# Patient Record
Sex: Female | Born: 1985 | Race: Black or African American | Hispanic: No | Marital: Single | Smoking: Never smoker
Health system: Southern US, Community
[De-identification: ages and names within clinical notes are randomized; demographics above are authoritative.]

## PROBLEM LIST (undated history)

## (undated) DIAGNOSIS — D500 Iron deficiency anemia secondary to blood loss (chronic): Secondary | ICD-10-CM

## (undated) DIAGNOSIS — I429 Cardiomyopathy, unspecified: Secondary | ICD-10-CM

## (undated) DIAGNOSIS — Z8614 Personal history of Methicillin resistant Staphylococcus aureus infection: Secondary | ICD-10-CM

## (undated) DIAGNOSIS — B009 Herpesviral infection, unspecified: Secondary | ICD-10-CM

## (undated) DIAGNOSIS — F419 Anxiety disorder, unspecified: Secondary | ICD-10-CM

## (undated) DIAGNOSIS — I517 Cardiomegaly: Secondary | ICD-10-CM

## (undated) DIAGNOSIS — F32A Depression, unspecified: Secondary | ICD-10-CM

## (undated) DIAGNOSIS — I10 Essential (primary) hypertension: Secondary | ICD-10-CM

## (undated) DIAGNOSIS — D259 Leiomyoma of uterus, unspecified: Secondary | ICD-10-CM

## (undated) DIAGNOSIS — Z9289 Personal history of other medical treatment: Secondary | ICD-10-CM

## (undated) DIAGNOSIS — R011 Cardiac murmur, unspecified: Secondary | ICD-10-CM

## (undated) DIAGNOSIS — R87629 Unspecified abnormal cytological findings in specimens from vagina: Secondary | ICD-10-CM

## (undated) DIAGNOSIS — N809 Endometriosis, unspecified: Secondary | ICD-10-CM

## (undated) DIAGNOSIS — N7093 Salpingitis and oophoritis, unspecified: Secondary | ICD-10-CM

## (undated) DIAGNOSIS — G4730 Sleep apnea, unspecified: Secondary | ICD-10-CM

## (undated) HISTORY — DX: Salpingitis and oophoritis, unspecified: N70.93

## (undated) HISTORY — DX: Endometriosis, unspecified: N80.9

## (undated) HISTORY — DX: Herpesviral infection, unspecified: B00.9

## (undated) HISTORY — PX: OTHER SURGICAL HISTORY: SHX169

## (undated) HISTORY — DX: Personal history of other medical treatment: Z92.89

## (undated) HISTORY — DX: Unspecified abnormal cytological findings in specimens from vagina: R87.629

## (undated) HISTORY — DX: Cardiomegaly: I51.7

## (undated) HISTORY — DX: Cardiac murmur, unspecified: R01.1

---

## 2004-04-07 ENCOUNTER — Emergency Department (HOSPITAL_COMMUNITY): Admission: EM | Admit: 2004-04-07 | Discharge: 2004-04-07 | Payer: Self-pay | Admitting: Emergency Medicine

## 2010-05-03 ENCOUNTER — Ambulatory Visit: Payer: BC Managed Care – PPO | Admitting: Internal Medicine

## 2010-05-03 ENCOUNTER — Encounter: Payer: Self-pay | Admitting: Internal Medicine

## 2010-05-03 DIAGNOSIS — M436 Torticollis: Secondary | ICD-10-CM | POA: Insufficient documentation

## 2010-05-04 DIAGNOSIS — D509 Iron deficiency anemia, unspecified: Secondary | ICD-10-CM | POA: Insufficient documentation

## 2010-05-04 DIAGNOSIS — I10 Essential (primary) hypertension: Secondary | ICD-10-CM | POA: Insufficient documentation

## 2010-05-08 HISTORY — PX: ROBOT ASSISTED MYOMECTOMY: SHX5142

## 2010-05-15 NOTE — Assessment & Plan Note (Signed)
Summary: neck pain   Vital Signs:  Patient Profile:   25 Years Old Female CC:      Pain in the neck and I think I have a sinus infection upon awakening yesterday Height:     62 inches Weight:      193 pounds O2 Sat:      100 % Temp:     97.3 degrees F Pulse rate:   81 / minute BP sitting:   147 / 99  (left arm)  Pt. in pain?   yes    Intensity:   5                   Current Allergies: ! * LATEXHistory of Present Illness Chief Complaint: Pain in the neck and I think I have a sinus infection upon awakening starting yesterday History of Present Illness: Patient with some sneezing and maybe post nasal drip but no nasal congestion, face pressure, f/c, sore throat and this has been going on for several weeks. Her real concern is right lateral neck pain upon awakening yesterday. No trauma, new activities and never had before. No arm symptoms .  REVIEW OF SYSTEMS Constitutional Symptoms      Denies fever, chills, night sweats, weight loss, weight gain, and fatigue.  Eyes       Denies change in vision, eye pain, eye discharge, glasses, contact lenses, and eye surgery. Ear/Nose/Throat/Mouth       Complains of ear pain and sinus problems.      Denies hearing loss/aids, change in hearing, ear discharge, dizziness, frequent runny nose, frequent nose bleeds, sore throat, hoarseness, and tooth pain or bleeding.      Comments: mild right. sneeze occas Respiratory       Denies dry cough, productive cough, wheezing, shortness of breath, asthma, bronchitis, and emphysema/COPD.  Cardiovascular       Denies murmurs, chest pain, and tires easily with exhertion.    Gastrointestinal       Denies stomach pain, nausea/vomiting, diarrhea, constipation, blood in bowel movements, and indigestion. Genitourniary       Denies painful urination, kidney stones, and loss of urinary control. Neurological       Denies paralysis, seizures, and fainting/blackouts. Musculoskeletal       Complains of muscle  pain, joint pain, and joint stiffness.      Denies decreased range of motion, redness, swelling, muscle weakness, and gout.      Comments: see hpi Skin       Denies bruising, unusual mles/lumps or sores, and hair/skin or nail changes.      Comments: eczema and acne Psych       Denies mood changes, temper/anger issues, anxiety/stress, speech problems, depression, and sleep problems.  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 Juline Patch MD   The patient and/or caregiver has been counseled thoroughly with regard to medications prescribed including dosage, schedule, interactions, rationale for use, and possible side effects and they verbalize understanding.  Diagnoses and expected course of recovery discussed and will return if not improved as expected or if the condition worsens. Patient and/or caregiver verbalized understanding.  Prescriptions: CYCLOBENZAPRINE HCL 10 MG TABS (CYCLOBENZAPRINE HCL) 1 by mouth three times a day as needed neck spasm  #20 x 0   Entered and Authorized by:   J. Juline Patch MD   Signed by:   Shela Commons. Juline Patch MD on 05/03/2010   Method used:   Electronically to        [facility]  [address] (retail)       [address], [address]       [place], [place]  [address]       Ph: [phone]       Fax: [phone]   RxID:   [id number]   Patient Instructions: 1)  aleve 1-2 two times a day with food. 2)  Take 650-1000mg  of Tylenol every 4-6 hours as needed for relief of pain or comfort of fever AVOID taking more than 4000mg   in a 24 hour period (can cause liver damage in higher doses). 3)  ice alternating with heat to neck. 4)  claritin-D two times a day for congestion. 5)  Please schedule a follow-up appointment as needed at emergency and primary MD. 6)  continue to monitor blood pressure and see MD if more than 135/85

## 2011-06-04 DIAGNOSIS — N803 Endometriosis of pelvic peritoneum: Secondary | ICD-10-CM | POA: Insufficient documentation

## 2011-07-10 DIAGNOSIS — IMO0001 Reserved for inherently not codable concepts without codable children: Secondary | ICD-10-CM | POA: Insufficient documentation

## 2014-03-09 DIAGNOSIS — Z9289 Personal history of other medical treatment: Secondary | ICD-10-CM

## 2014-03-09 HISTORY — PX: IR IMAGE GUIDED DRAINAGE BY PERCUTANEOUS CATHETER: IMG5465

## 2014-03-09 HISTORY — DX: Personal history of other medical treatment: Z92.89

## 2014-08-31 DIAGNOSIS — R011 Cardiac murmur, unspecified: Secondary | ICD-10-CM | POA: Insufficient documentation

## 2014-11-28 ENCOUNTER — Encounter: Payer: Self-pay | Admitting: Radiology

## 2014-11-28 ENCOUNTER — Emergency Department: Payer: 59

## 2014-11-28 ENCOUNTER — Inpatient Hospital Stay
Admission: EM | Admit: 2014-11-28 | Discharge: 2014-12-01 | DRG: 761 | Payer: 59 | Attending: Obstetrics and Gynecology | Admitting: Obstetrics and Gynecology

## 2014-11-28 DIAGNOSIS — R1900 Intra-abdominal and pelvic swelling, mass and lump, unspecified site: Secondary | ICD-10-CM | POA: Diagnosis present

## 2014-11-28 DIAGNOSIS — R531 Weakness: Secondary | ICD-10-CM | POA: Diagnosis present

## 2014-11-28 DIAGNOSIS — D259 Leiomyoma of uterus, unspecified: Principal | ICD-10-CM | POA: Diagnosis present

## 2014-11-28 DIAGNOSIS — K429 Umbilical hernia without obstruction or gangrene: Secondary | ICD-10-CM | POA: Diagnosis present

## 2014-11-28 DIAGNOSIS — N7093 Salpingitis and oophoritis, unspecified: Secondary | ICD-10-CM | POA: Diagnosis present

## 2014-11-28 DIAGNOSIS — D72829 Elevated white blood cell count, unspecified: Secondary | ICD-10-CM | POA: Diagnosis present

## 2014-11-28 DIAGNOSIS — N809 Endometriosis, unspecified: Secondary | ICD-10-CM | POA: Diagnosis present

## 2014-11-28 DIAGNOSIS — I10 Essential (primary) hypertension: Secondary | ICD-10-CM | POA: Diagnosis present

## 2014-11-28 DIAGNOSIS — R1903 Right lower quadrant abdominal swelling, mass and lump: Secondary | ICD-10-CM

## 2014-11-28 DIAGNOSIS — R1084 Generalized abdominal pain: Secondary | ICD-10-CM | POA: Diagnosis not present

## 2014-11-28 DIAGNOSIS — D500 Iron deficiency anemia secondary to blood loss (chronic): Secondary | ICD-10-CM | POA: Diagnosis present

## 2014-11-28 HISTORY — DX: Essential (primary) hypertension: I10

## 2014-11-28 HISTORY — DX: Iron deficiency anemia secondary to blood loss (chronic): D50.0

## 2014-11-28 HISTORY — DX: Salpingitis and oophoritis, unspecified: N70.93

## 2014-11-28 HISTORY — DX: Leiomyoma of uterus, unspecified: D25.9

## 2014-11-28 LAB — WET PREP, GENITAL
Trich, Wet Prep: NONE SEEN
Yeast Wet Prep HPF POC: NONE SEEN

## 2014-11-28 LAB — ABO/RH: ABO/RH(D): O NEG

## 2014-11-28 LAB — CBC WITH DIFFERENTIAL/PLATELET
Basophils Absolute: 0 10*3/uL (ref 0–0.1)
Basophils Relative: 0 %
Eosinophils Absolute: 0 10*3/uL (ref 0–0.7)
Eosinophils Relative: 0 %
HCT: 21.1 % — ABNORMAL LOW (ref 35.0–47.0)
Hemoglobin: 6.1 g/dL — ABNORMAL LOW (ref 12.0–16.0)
Lymphocytes Relative: 3 %
Lymphs Abs: 0.4 10*3/uL — ABNORMAL LOW (ref 1.0–3.6)
MCH: 17.8 pg — ABNORMAL LOW (ref 26.0–34.0)
MCHC: 29 g/dL — ABNORMAL LOW (ref 32.0–36.0)
MCV: 61.2 fL — ABNORMAL LOW (ref 80.0–100.0)
Monocytes Absolute: 0.6 10*3/uL (ref 0.2–0.9)
Monocytes Relative: 4 %
Neutro Abs: 14.6 10*3/uL — ABNORMAL HIGH (ref 1.4–6.5)
Neutrophils Relative %: 93 %
Platelets: 293 10*3/uL (ref 150–440)
RBC: 3.44 MIL/uL — ABNORMAL LOW (ref 3.80–5.20)
RDW: 24.9 % — ABNORMAL HIGH (ref 11.5–14.5)
WBC: 15.7 10*3/uL — ABNORMAL HIGH (ref 3.6–11.0)

## 2014-11-28 LAB — URINALYSIS COMPLETE WITH MICROSCOPIC (ARMC ONLY)
Bacteria, UA: NONE SEEN
Bilirubin Urine: NEGATIVE
Glucose, UA: NEGATIVE mg/dL
Ketones, ur: NEGATIVE mg/dL
Leukocytes, UA: NEGATIVE
Nitrite: NEGATIVE
Protein, ur: NEGATIVE mg/dL
Specific Gravity, Urine: 1.014 (ref 1.005–1.030)
pH: 7 (ref 5.0–8.0)

## 2014-11-28 LAB — CHLAMYDIA/NGC RT PCR (ARMC ONLY)
Chlamydia Tr: NOT DETECTED
N gonorrhoeae: NOT DETECTED

## 2014-11-28 LAB — COMPREHENSIVE METABOLIC PANEL
ALT: 10 U/L — ABNORMAL LOW (ref 14–54)
AST: 23 U/L (ref 15–41)
Albumin: 3.5 g/dL (ref 3.5–5.0)
Alkaline Phosphatase: 44 U/L (ref 38–126)
Anion gap: 8 (ref 5–15)
BUN: 16 mg/dL (ref 6–20)
CO2: 25 mmol/L (ref 22–32)
Calcium: 9.1 mg/dL (ref 8.9–10.3)
Chloride: 103 mmol/L (ref 101–111)
Creatinine, Ser: 0.96 mg/dL (ref 0.44–1.00)
GFR calc Af Amer: >60 mL/min (ref >60)
GFR calc non Af Amer: >60 mL/min (ref >60)
Glucose, Bld: 118 mg/dL — ABNORMAL HIGH (ref 65–99)
Potassium: 3.4 mmol/L — ABNORMAL LOW (ref 3.5–5.1)
Sodium: 136 mmol/L (ref 135–145)
Total Bilirubin: 0.4 mg/dL (ref 0.3–1.2)
Total Protein: 7.5 g/dL (ref 6.5–8.1)

## 2014-11-28 LAB — PREPARE RBC (CROSSMATCH)

## 2014-11-28 LAB — PREGNANCY, URINE: Preg Test, Ur: NEGATIVE

## 2014-11-28 LAB — LIPASE, BLOOD: Lipase: 16 U/L — ABNORMAL LOW (ref 22–51)

## 2014-11-28 LAB — HCG, QUANTITATIVE, PREGNANCY: hCG, Beta Chain, Quant, S: <1 m[IU]/mL (ref <5)

## 2014-11-28 MED ORDER — SODIUM CHLORIDE 0.9 % IV SOLN
10.0000 mL/h | Freq: Once | INTRAVENOUS | Status: AC
  Administered 2014-11-28: 10 mL/h via INTRAVENOUS
  Filled 2014-11-28: qty 1000

## 2014-11-28 MED ORDER — SIMETHICONE 80 MG PO CHEW
80.0000 mg | CHEWABLE_TABLET | ORAL | Status: DC | PRN
  Administered 2014-11-29 – 2014-12-01 (×2): 80 mg via ORAL
  Filled 2014-11-28 (×2): qty 1

## 2014-11-28 MED ORDER — MORPHINE SULFATE (PF) 4 MG/ML IV SOLN
4.0000 mg | Freq: Once | INTRAVENOUS | Status: AC
  Administered 2014-11-28: 4 mg via INTRAVENOUS
  Filled 2014-11-28: qty 1

## 2014-11-28 MED ORDER — DIPHENHYDRAMINE HCL 12.5 MG/5ML PO ELIX
12.5000 mg | ORAL_SOLUTION | Freq: Four times a day (QID) | ORAL | Status: DC | PRN
  Filled 2014-11-28: qty 5

## 2014-11-28 MED ORDER — KETOROLAC TROMETHAMINE 30 MG/ML IJ SOLN
30.0000 mg | Freq: Four times a day (QID) | INTRAMUSCULAR | Status: DC
  Administered 2014-11-28 – 2014-11-30 (×6): 30 mg via INTRAVENOUS
  Filled 2014-11-28 (×6): qty 1

## 2014-11-28 MED ORDER — ONDANSETRON HCL 4 MG/2ML IJ SOLN: 4.0000 mg | Freq: Four times a day (QID) | INTRAMUSCULAR | Status: DC | PRN

## 2014-11-28 MED ORDER — PRENATAL MULTIVITAMIN CH
1.0000 | ORAL_TABLET | Freq: Every day | ORAL | Status: DC
  Administered 2014-11-29: 1 via ORAL
  Filled 2014-11-28: qty 1

## 2014-11-28 MED ORDER — CLINDAMYCIN PHOSPHATE 900 MG/50ML IV SOLN: 900.0000 mg | Freq: Once | INTRAVENOUS | Status: DC

## 2014-11-28 MED ORDER — DEXTROSE-NACL 5-0.45 % IV SOLN
INTRAVENOUS | Status: DC
  Administered 2014-11-28 – 2014-11-30 (×6): via INTRAVENOUS

## 2014-11-28 MED ORDER — DEXTROSE 5 % IV SOLN
2.0000 g | Freq: Four times a day (QID) | INTRAVENOUS | Status: DC
  Administered 2014-11-28 – 2014-12-01 (×12): 2 g via INTRAVENOUS
  Filled 2014-11-28 (×16): qty 2

## 2014-11-28 MED ORDER — DIPHENHYDRAMINE HCL 50 MG/ML IJ SOLN: 12.5000 mg | Freq: Four times a day (QID) | INTRAMUSCULAR | Status: DC | PRN

## 2014-11-28 MED ORDER — IOHEXOL 240 MG/ML SOLN
25.0000 mL | Freq: Once | INTRAMUSCULAR | Status: DC | PRN
  Administered 2014-11-28: 25 mL via ORAL
  Filled 2014-11-28: qty 50

## 2014-11-28 MED ORDER — ONDANSETRON HCL 4 MG/2ML IJ SOLN
INTRAMUSCULAR | Status: AC
  Administered 2014-11-28: 4 mg
  Filled 2014-11-28: qty 2

## 2014-11-28 MED ORDER — MORPHINE SULFATE 1 MG/ML IV SOLN
INTRAVENOUS | Status: DC
  Administered 2014-11-28 (×2): via INTRAVENOUS
  Filled 2014-11-28 (×2): qty 25

## 2014-11-28 MED ORDER — DOXYCYCLINE HYCLATE 100 MG PO TABS
100.0000 mg | ORAL_TABLET | Freq: Two times a day (BID) | ORAL | Status: DC
  Administered 2014-11-28 – 2014-12-01 (×7): 100 mg via ORAL
  Filled 2014-11-28 (×9): qty 1

## 2014-11-28 MED ORDER — IOHEXOL 300 MG/ML  SOLN
100.0000 mL | Freq: Once | INTRAMUSCULAR | Status: AC | PRN
  Administered 2014-11-28: 100 mL via INTRAVENOUS

## 2014-11-28 MED ORDER — NALOXONE HCL 0.4 MG/ML IJ SOLN: 0.4000 mg | INTRAMUSCULAR | Status: DC | PRN

## 2014-11-28 MED ORDER — ONDANSETRON HCL 4 MG PO TABS: 4.0000 mg | ORAL_TABLET | Freq: Four times a day (QID) | ORAL | Status: DC | PRN

## 2014-11-28 MED ORDER — KETOROLAC TROMETHAMINE 30 MG/ML IJ SOLN: 30.0000 mg | Freq: Four times a day (QID) | INTRAMUSCULAR | Status: DC

## 2014-11-28 MED ORDER — SODIUM CHLORIDE 0.9 % IJ SOLN: 9.0000 mL | INTRAMUSCULAR | Status: DC | PRN

## 2014-11-28 MED ORDER — GENTAMICIN SULFATE 40 MG/ML IJ SOLN: 2.0000 mg/kg | Freq: Once | INTRAVENOUS | Status: DC

## 2014-11-28 NOTE — ED Notes (Signed)
Patient is resting comfortably. 

## 2014-11-28 NOTE — ED Notes (Signed)
Pt in with co chills and generalized abd pain.  Has nausea no vomiting, has been constipated.  Last BM last night, took miralax.

## 2014-11-28 NOTE — ED Provider Notes (Signed)
[facility] Emergency Department Provider Note     Time seen: ----------------------------------------- 8:05 AM on 11/28/2014 -----------------------------------------    I have reviewed the triage vital signs and the nursing notes.   HISTORY  Chief Complaint Abdominal Pain    HPI Lynn Boyd is a 29 y.o. female who presents ER for generalized abdominal pain, worse on the left side. Also having vaginal bleeding, states it is time for her menstrual cycle. She 7 days into her menstrual cycle, bleeding is under control this time. She's had some chills but no fever, no nausea vomiting or diarrhea. Patient has reported recent diarrhea, also reports fatigue and weakness.   No past medical history on file.  Patient Active Problem List   Diagnosis Date Noted  . ANEMIA-IRON DEFICIENCY 05/04/2010  . HYPERTENSION 05/04/2010  . TORTICOLLIS 05/03/2010    No past surgical history on file.  Allergies Latex  Social History Social History  Substance Use Topics  . Smoking status: Not on file  . Smokeless tobacco: Not on file  . Alcohol Use: Not on file    Review of Systems Constitutional: Negative for fever. Eyes: Negative for visual changes. ENT: Negative for sore throat. Cardiovascular: Negative for chest pain. Respiratory: Negative for shortness of breath. Gastrointestinal: Positive for abdominal pain, constipation Genitourinary: Negative for dysuria. Musculoskeletal: Negative for back pain. Skin: Negative for rash. Neurological: Negative for headaches, positive for weakness  10-point ROS otherwise negative.  ____________________________________________   PHYSICAL EXAM:  VITAL SIGNS: ED Triage Vitals  Enc Vitals Group     BP 11/28/14 0653 110/61 mmHg     Pulse Rate 11/28/14 0653 108     Resp 11/28/14 0653 18     Temp 11/28/14 0653 99.6 F (37.6 C)     Temp Source 11/28/14 0653 Oral     SpO2 11/28/14 0653 100 %     Weight 11/28/14  0653 190 lb (86.183 kg)     Height 11/28/14 0653 5\' 2"  (1.575 m)     Head Cir --      Peak Flow --      Pain Score 11/28/14 0653 8     Pain Loc --      Pain Edu? --      Excl. in [place]? --     Constitutional: Alert and oriented. Well appearing and in no distress. Eyes: Pale conjunctiva. PERRL. Normal extraocular movements. ENT   Head: Normocephalic and atraumatic.   Nose: No congestion/rhinnorhea.   Mouth/Throat: Mucous membranes are moist.   Neck: No stridor. Cardiovascular: Normal rate, regular rhythm. Normal and symmetric distal pulses are present in all extremities. No murmurs, rubs, or gallops. Respiratory: Normal respiratory effort without tachypnea nor retractions. Breath sounds are clear and equal bilaterally. No wheezes/rales/rhonchi. Gastrointestinal: Diffuse abdominal tenderness, worse in the left side. No rebound or guarding. Normal bowel sounds. Genitourinary: active mild vaginal bleeding, dark blood is present. No significant cervical motion tenderness but extensive right adnexal tenderness and fullness. Musculoskeletal: Nontender with normal range of motion in all extremities. No joint effusions.  No lower extremity tenderness nor edema. Neurologic:  Normal speech and language. No gross focal neurologic deficits are appreciated. Speech is normal. No gait instability. Skin:  Skin is warm, dry and intact. No rash noted. Psychiatric: Mood and affect are normal. Speech and behavior are normal. Patient exhibits appropriate insight and judgment. ____________________________________________  ED COURSE:  Pertinent labs & imaging results that were available during my care of the patient were reviewed by me and  considered in my medical decision making (see chart for details). Patient with generalized abdominal pain, no clear etiology. She may need CT imaging. We'll give IV morphine for pain. ____________________________________________    LABS (pertinent  positives/negatives)  Labs Reviewed  COMPREHENSIVE METABOLIC PANEL - Abnormal; Notable for the following:    Potassium 3.4 (*)    Glucose, Bld 118 (*)    ALT 10 (*)    All other components within normal limits  LIPASE, BLOOD - Abnormal; Notable for the following:    Lipase 16 (*)    All other components within normal limits  CBC WITH DIFFERENTIAL/PLATELET - Abnormal; Notable for the following:    WBC 15.7 (*)    RBC 3.44 (*)    Hemoglobin 6.1 (*)    HCT 21.1 (*)    MCV 61.2 (*)    MCH 17.8 (*)    MCHC 29.0 (*)    RDW 24.9 (*)    Neutro Abs 14.6 (*)    Lymphs Abs 0.4 (*)    All other components within normal limits  WET PREP, GENITAL  CHLAMYDIA/NGC RT PCR ([facility] ONLY)  HCG, QUANTITATIVE, PREGNANCY  PREGNANCY, URINE  URINALYSIS COMPLETEWITH MICROSCOPIC ([facility] ONLY)  TYPE AND SCREEN  PREPARE RBC (CROSSMATCH)  ABO/RH   CRITICAL CARE Performed by: Earleen Newport   Total critical care time: 30 minutes  Critical care time was exclusive of separately billable procedures and treating other patients.  Critical care was necessary to treat or prevent imminent or life-threatening deterioration.  Critical care was time spent personally by me on the following activities: development of treatment plan with patient and/or surrogate as well as nursing, discussions with consultants, evaluation of patient's response to treatment, examination of patient, obtaining history from patient or surrogate, ordering and performing treatments and interventions, ordering and review of laboratory studies, ordering and review of radiographic studies, pulse oximetry and re-evaluation of patient's condition.   RADIOLOGY Images were viewed by me  CT abdomen and pelvis. IMPRESSION: Uterine fibroids.  Complex fluid collection in the cul-de-sac with moderate free fluid in the pelvis and adjacent to the liver edge. Findings most compatible with tubo-ovarian abscess.  5.6 cm avidly  enhancing mesenteric mass in the right abdomen. This does not appear to arise from bowel. 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 Soft tissue sarcoma or GIST tumor possible but felt less likely given its appearance and location. Recommend tissue sampling for resection.  Small umbilical hernia containing fat. ____________________________________________  FINAL ASSESSMENT AND PLAN  Abdominal pain, anemia, TOA  Plan: Patient with labs and imaging as dictated above. Patient will be given IV clindamycin and gentamicin to cover her for possible TOA infection. She'll be discussed with OB/GYN on call due to complications from heavy vaginal bleeding and chronic anemia, also what is likely acute presentation for tubo-ovarian abscess.  I discussed the case with Dr. Georgianne Fick who has agreed to admit the patient. We'll continue IV antibiotics and blood transfusion.currently she is stable for admission.   Earleen Newport, MD   Earleen Newport, MD 11/28/14 1057

## 2014-11-28 NOTE — H&P (Addendum)
Obstetrics & Gynecology Consult H&P  Chief Complaint: Abdominal Pain  History of Present Illness: Patient is a 29 y.o. G0 presenting to the ER with 24-hr history of abdominal pain.  The patient started feeling ill yesterday evening, felt like she has a low grade fever when she woke up.  Her mother had stayed with her the day before and had a URI so she attributed that to her initial symptoms.  She then began developing lower abdominal pain.  No associated changes in bowl movements or micturition.  She has had some mild nausea, no emesis, no vaginal discharge.  Currently on her menses.  Denies history of STI or PID in the past.      Patient currently on her menses, has had history of heavy menses, better since her myomectomy but first 2 days are still heavy.  Lasts for up to 7 days.  Declines contraception for management of her menses.    Review of Systems:10 point review of systems  Past Medical History:  Anemia Fibroids HTN  Past Surgical History:  Myomectomy  Family History:  No family history on file.  Social History:  Social History   Social History  . Marital Status: Married    Spouse Name: N/A  . Number of Children: N/A  . Years of Education: N/A   Occupational History  . Not on file.   Social History Main Topics  . Smoking status: Never Smoker   . Smokeless tobacco: Not on file  . Alcohol Use: Not on file  . Drug Use: Not on file  . Sexual Activity: Not on file   Other Topics Concern  . Not on file   Social History Narrative  . No narrative on file    Allergies:  Allergies  Allergen Reactions  . Latex     Medications: Prior to Admission medications   Medication Sig Start Date End Date Taking? Authorizing Provider  hydrochlorothiazide (HYDRODIURIL) 25 MG tablet Take 25 mg by mouth daily.   Yes Historical Provider, MD  naproxen sodium (ANAPROX) 220 MG tablet Take 440 mg by mouth every 8 (eight) hours as needed (menstrual cramps).   Yes Historical Provider,  MD  Prenatal Vit-Fe Fumarate-FA (PRENATAL MULTIVITAMIN) TABS tablet Take 1 tablet by mouth daily at 12 noon.   Yes Historical Provider, MD  valACYclovir (VALTREX) 500 MG tablet Take 500 mg by mouth 2 (two) times daily as needed (flare-ups).   Yes Historical Provider, MD    Physical Exam Vitals: Blood pressure 121/64, pulse 99, temperature 98.9 F (37.2 C), temperature source Oral, resp. rate 18, height '5\' 2"'  (1.575 m), weight 86.183 kg (190 lb), last menstrual period 11/24/2014, SpO2 100 %.  General: NAD HEENT: normocephalic, anicteric Pulmonary: CTAB Cardiovascular: RRR Abdomen: NABS, soft, non-distended, moderate tenderness BLQ to medium depth palpation Genitourinary: deferred Extremities: no edema Neurologic: CN II-XII grossly intact Psychiatric: A&O, mood appropriate, affect full  Labs: Results for orders placed or performed during the hospital encounter of 11/28/14 (from the past 72 hour(s))  Comprehensive metabolic panel     Status: Abnormal   Collection Time: 11/28/14  6:56 AM  Result Value Ref Range   Sodium 136 135 - 145 mmol/L   Potassium 3.4 (L) 3.5 - 5.1 mmol/L   Chloride 103 101 - 111 mmol/L   CO2 25 22 - 32 mmol/L   Glucose, Bld 118 (H) 65 - 99 mg/dL   BUN 16 6 - 20 mg/dL   Creatinine, Ser 0.96 0.44 - 1.00 mg/dL  Calcium 9.1 8.9 - 10.3 mg/dL   Total Protein 7.5 6.5 - 8.1 g/dL   Albumin 3.5 3.5 - 5.0 g/dL   AST 23 15 - 41 U/L   ALT 10 (L) 14 - 54 U/L   Alkaline Phosphatase 44 38 - 126 U/L   Total Bilirubin 0.4 0.3 - 1.2 mg/dL   GFR calc non Af Amer >60 >60 mL/min   GFR calc Af Amer >60 >60 mL/min    Comment: (NOTE) The eGFR has been calculated using the CKD EPI equation. This calculation has not been validated in all clinical situations. eGFR's persistently <60 mL/min signify possible Chronic Kidney Disease.    Anion gap 8 5 - 15  Lipase, blood     Status: Abnormal   Collection Time: 11/28/14  6:56 AM  Result Value Ref Range   Lipase 16 (L) 22 - 51 U/L   CBC WITH DIFFERENTIAL     Status: Abnormal   Collection Time: 11/28/14  6:56 AM  Result Value Ref Range   WBC 15.7 (H) 3.6 - 11.0 K/uL   RBC 3.44 (L) 3.80 - 5.20 MIL/uL   Hemoglobin 6.1 (L) 12.0 - 16.0 g/dL   HCT 21.1 (L) 35.0 - 47.0 %   MCV 61.2 (L) 80.0 - 100.0 fL   MCH 17.8 (L) 26.0 - 34.0 pg   MCHC 29.0 (L) 32.0 - 36.0 g/dL   RDW 24.9 (H) 11.5 - 14.5 %   Platelets 293 150 - 440 K/uL   Neutrophils Relative % 93 %   Neutro Abs 14.6 (H) 1.4 - 6.5 K/uL   Lymphocytes Relative 3 %   Lymphs Abs 0.4 (L) 1.0 - 3.6 K/uL   Monocytes Relative 4 %   Monocytes Absolute 0.6 0.2 - 0.9 K/uL   Eosinophils Relative 0 %   Eosinophils Absolute 0.0 0 - 0.7 K/uL   Basophils Relative 0 %   Basophils Absolute 0.0 0 - 0.1 K/uL  Type and screen for Red Blood Exchange     Status: None (Preliminary result)   Collection Time: 11/28/14  8:39 AM  Result Value Ref Range   ABO/RH(D) O NEG    Antibody Screen NEG    Sample Expiration 12/01/2014    Unit Number [id number]    Blood Component Type RED CELLS,LR    Unit division 00    Status of Unit ISSUED    Transfusion Status OK TO TRANSFUSE    Crossmatch Result Compatible   Prepare RBC     Status: None   Collection Time: 11/28/14  8:39 AM  Result Value Ref Range   Order Confirmation ORDER PROCESSED BY BLOOD BANK   hCG, quantitative, pregnancy     Status: None   Collection Time: 11/28/14  8:39 AM  Result Value Ref Range   hCG, Beta Chain, Quant, S <1 <5 mIU/mL    Comment:          GEST. AGE      CONC.  (mIU/mL)   <=1 WEEK        5 - 50     2 WEEKS       50 - 500     3 WEEKS       100 - 10,000     4 WEEKS     1,000 - 30,000     5 WEEKS     3,500 - 115,000   6-8 WEEKS     12,000 - 270,000    12 WEEKS     15,000 -  220,000        FEMALE AND NON-PREGNANT FEMALE:     LESS THAN 5 mIU/mL   ABO/Rh     Status: None   Collection Time: 11/28/14  8:40 AM  Result Value Ref Range   ABO/RH(D) O NEG   Pregnancy, urine     Status: None   Collection Time:  11/28/14 10:15 AM  Result Value Ref Range   Preg Test, Ur NEGATIVE NEGATIVE  Urinalysis complete, with microscopic ([facility] only)     Status: Abnormal   Collection Time: 11/28/14 10:15 AM  Result Value Ref Range   Color, Urine STRAW (A) YELLOW   APPearance CLEAR (A) CLEAR   Glucose, UA NEGATIVE NEGATIVE mg/dL   Bilirubin Urine NEGATIVE NEGATIVE   Ketones, ur NEGATIVE NEGATIVE mg/dL   Specific Gravity, Urine 1.014 1.005 - 1.030   Hgb urine dipstick 3+ (A) NEGATIVE   pH 7.0 5.0 - 8.0   Protein, ur NEGATIVE NEGATIVE mg/dL   Nitrite NEGATIVE NEGATIVE   Leukocytes, UA NEGATIVE NEGATIVE   RBC / HPF 0-5 0 - 5 RBC/hpf   WBC, UA 0-5 0 - 5 WBC/hpf   Bacteria, UA NONE SEEN NONE SEEN   Squamous Epithelial / LPF 0-5 (A) NONE SEEN  Wet prep, genital     Status: Abnormal   Collection Time: 11/28/14 10:46 AM  Result Value Ref Range   Yeast Wet Prep HPF POC NONE SEEN NONE SEEN   Trich, Wet Prep NONE SEEN NONE SEEN   Clue Cells Wet Prep HPF POC FEW (A) NONE SEEN   WBC, Wet Prep HPF POC FEW (A) NONE SEEN  Chlamydia/NGC rt PCR ([facility] only)     Status: None   Collection Time: 11/28/14 10:46 AM  Result Value Ref Range   Specimen source GC/Chlam ENDOCERVICAL    Chlamydia Tr NOT DETECTED NOT DETECTED   N gonorrhoeae NOT DETECTED NOT DETECTED    Comment: (NOTE) 100  This methodology has not been evaluated in pregnant women or in 200  patients with a history of hysterectomy. 300 400  This methodology will not be performed on patients less than 68  years of age.     Imaging Ct Abdomen Pelvis W Contrast  11/28/2014   CLINICAL DATA:  Chills, generalized abdominal pain.  Leukocytosis.  EXAM: CT ABDOMEN AND PELVIS WITH CONTRAST  TECHNIQUE: Multidetector CT imaging of the abdomen and pelvis was performed using the standard protocol following bolus administration of intravenous contrast.  CONTRAST:  174m OMNIPAQUE IOHEXOL 300 MG/ML  SOLN  COMPARISON:  None  FINDINGS: There is cardiomegaly. Dependent  opacities in both lung bases, likely atelectasis. No effusions.  Mild diffuse fatty infiltration of the liver. No focal abnormality. Gallbladder, pancreas, spleen, adrenals and kidneys are normal.  Multiple masses noted within the uterus compatible with fibroids. There is moderate free fluid in the pelvis. Fluid also noted adjacent to the liver edge. Complex fluid collection noted in the cul-de-sac measuring 3.8 cm in greatest diameter. Findings most compatible with pelvic inflammatory disease and tubo-ovarian abscess.  There is a solid avidly enhancing heterogeneous mass in the right abdomen. This is inferior to the liver edge adjacent to right small bowel loops and the ascending colon, but does not appear to arise from any of these structures. This appears to be within the mesentery. This measures 5.6 x 4.7 cm on image 47.  Small umbilical hernia containing fat. No acute bony abnormality or focal bone lesion.  IMPRESSION: Uterine fibroids.  Complex fluid collection  in the cul-de-sac with moderate free fluid in the pelvis and adjacent to the liver edge. Findings most compatible with tubo-ovarian abscess.  5.6 cm avidly enhancing mesenteric mass in the right abdomen. This does not appear to arise from bowel. 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 Soft tissue sarcoma or GIST tumor possible but felt less likely given its appearance and location. Recommend tissue sampling for resection.  Small umbilical hernia containing fat.   Electronically Signed   By: Rolm Baptise M.D.   On: 11/28/2014 10:15    Assessment: 29 y.o. G0 with culture negative TOA and anemia  Plan: 1) Chronic blood loss anemia - 1 unit of packed reb blood cells.  Will recheck CBC in AM.  My recommendation if for some sort of regimen that suppresses menstrual cycle and po ferrous sulfate outpatient  2) TOA - morphine PCA, IV cefoxitin & po doxycycline  3) FEN D5 1/2NS at 158m/hr, general diet  4) Disposition -  pending clinical improvement in pain

## 2014-11-29 LAB — PREPARE RBC (CROSSMATCH)

## 2014-11-29 LAB — CBC
HCT: 21.4 % — ABNORMAL LOW (ref 35.0–47.0)
HCT: 25.3 % — ABNORMAL LOW (ref 35.0–47.0)
Hemoglobin: 6.3 g/dL — ABNORMAL LOW (ref 12.0–16.0)
Hemoglobin: 7.8 g/dL — ABNORMAL LOW (ref 12.0–16.0)
MCH: 18.6 pg — ABNORMAL LOW (ref 26.0–34.0)
MCH: 20.3 pg — ABNORMAL LOW (ref 26.0–34.0)
MCHC: 29.3 g/dL — ABNORMAL LOW (ref 32.0–36.0)
MCHC: 30.8 g/dL — ABNORMAL LOW (ref 32.0–36.0)
MCV: 63.5 fL — ABNORMAL LOW (ref 80.0–100.0)
MCV: 65.9 fL — ABNORMAL LOW (ref 80.0–100.0)
Platelets: 240 10*3/uL (ref 150–440)
Platelets: 217 10*3/uL (ref 150–440)
RBC: 3.37 MIL/uL — ABNORMAL LOW (ref 3.80–5.20)
RBC: 3.84 MIL/uL (ref 3.80–5.20)
RDW: 26.1 % — ABNORMAL HIGH (ref 11.5–14.5)
RDW: 29.5 % — ABNORMAL HIGH (ref 11.5–14.5)
WBC: 16.9 10*3/uL — ABNORMAL HIGH (ref 3.6–11.0)
WBC: 19.7 10*3/uL — ABNORMAL HIGH (ref 3.6–11.0)

## 2014-11-29 MED ORDER — SODIUM CHLORIDE 0.9 % IV SOLN
Freq: Once | INTRAVENOUS | Status: AC
  Administered 2014-11-29: 09:00:00 via INTRAVENOUS

## 2014-11-29 MED ORDER — DIPHENHYDRAMINE HCL 25 MG PO CAPS
25.0000 mg | ORAL_CAPSULE | Freq: Once | ORAL | Status: AC
  Administered 2014-11-29: 25 mg via ORAL
  Filled 2014-11-29: qty 1

## 2014-11-29 MED ORDER — OXYCODONE-ACETAMINOPHEN 5-325 MG PO TABS
1.0000 | ORAL_TABLET | ORAL | Status: DC | PRN
  Administered 2014-11-29 – 2014-11-30 (×3): 2 via ORAL
  Filled 2014-11-29 (×3): qty 2

## 2014-11-29 MED ORDER — ACETAMINOPHEN 325 MG PO TABS
650.0000 mg | ORAL_TABLET | Freq: Once | ORAL | Status: AC
  Administered 2014-11-29: 650 mg via ORAL
  Filled 2014-11-29: qty 2

## 2014-11-29 NOTE — Progress Notes (Signed)
pca discontinued, 39ml wasted in pixis

## 2014-11-29 NOTE — Progress Notes (Signed)
Obstetric and Gynecology  Subjective  Feeling better, pain is 3/10.  No nausea or emesis  Objective   Filed Vitals:   11/29/14 0349  BP:   Pulse:   Temp:   Resp: 24     Intake/Output Summary (Last 24 hours) at 11/29/14 0740 Last data filed at 11/29/14 0551  Gross per 24 hour  Intake    540 ml  Output    550 ml  Net    -10 ml    General: NAD Pulmonary: no increased work of breathing Abdomen: NABS, soft, non-tender, non-distended Extremities: no edema  Labs: Results for orders placed or performed during the hospital encounter of 11/28/14 (from the past 24 hour(s))  Type and screen for Red Blood Exchange     Status: None (Preliminary result)   Collection Time: 11/28/14  8:39 AM  Result Value Ref Range   ABO/RH(D) O NEG    Antibody Screen NEG    Sample Expiration 12/01/2014    Unit Number [id number]    Blood Component Type RED CELLS,LR    Unit division 00    Status of Unit ISSUED    Transfusion Status OK TO TRANSFUSE    Crossmatch Result Compatible   Prepare RBC     Status: None   Collection Time: 11/28/14  8:39 AM  Result Value Ref Range   Order Confirmation ORDER PROCESSED BY BLOOD BANK   hCG, quantitative, pregnancy     Status: None   Collection Time: 11/28/14  8:39 AM  Result Value Ref Range   hCG, Beta Chain, Quant, S <1 <5 mIU/mL  ABO/Rh     Status: None   Collection Time: 11/28/14  8:40 AM  Result Value Ref Range   ABO/RH(D) O NEG   Pregnancy, urine     Status: None   Collection Time: 11/28/14 10:15 AM  Result Value Ref Range   Preg Test, Ur NEGATIVE NEGATIVE  Urinalysis complete, with microscopic ([facility] only)     Status: Abnormal   Collection Time: 11/28/14 10:15 AM  Result Value Ref Range   Color, Urine STRAW (A) YELLOW   APPearance CLEAR (A) CLEAR   Glucose, UA NEGATIVE NEGATIVE mg/dL   Bilirubin Urine NEGATIVE NEGATIVE   Ketones, ur NEGATIVE NEGATIVE mg/dL   Specific Gravity, Urine 1.014 1.005 - 1.030   Hgb urine dipstick 3+ (A) NEGATIVE   pH 7.0 5.0 - 8.0   Protein, ur NEGATIVE NEGATIVE mg/dL   Nitrite NEGATIVE NEGATIVE   Leukocytes, UA NEGATIVE NEGATIVE   RBC / HPF 0-5 0 - 5 RBC/hpf   WBC, UA 0-5 0 - 5 WBC/hpf   Bacteria, UA NONE SEEN NONE SEEN   Squamous Epithelial / LPF 0-5 (A) NONE SEEN  Wet prep, genital     Status: Abnormal   Collection Time: 11/28/14 10:46 AM  Result Value Ref Range   Yeast Wet Prep HPF POC NONE SEEN NONE SEEN   Trich, Wet Prep NONE SEEN NONE SEEN   Clue Cells Wet Prep HPF POC FEW (A) NONE SEEN   WBC, Wet Prep HPF POC FEW (A) NONE SEEN  Chlamydia/NGC rt PCR ([facility] only)     Status: None   Collection Time: 11/28/14 10:46 AM  Result Value Ref Range   Specimen source GC/Chlam ENDOCERVICAL    Chlamydia Tr NOT DETECTED NOT DETECTED   N gonorrhoeae NOT DETECTED NOT DETECTED  CBC     Status: Abnormal   Collection Time: 11/29/14  6:41 AM  Result Value Ref Range  WBC 16.9 (H) 3.6 - 11.0 K/uL   RBC 3.37 (L) 3.80 - 5.20 MIL/uL   Hemoglobin 6.3 (L) 12.0 - 16.0 g/dL   HCT 21.4 (L) 35.0 - 47.0 %   MCV 63.5 (L) 80.0 - 100.0 fL   MCH 18.6 (L) 26.0 - 34.0 pg   MCHC 29.3 (L) 32.0 - 36.0 g/dL   RDW 26.1 (H) 11.5 - 14.5 %   Platelets 240 150 - 440 K/uL    Cultures: Results for orders placed or performed during the hospital encounter of 11/28/14  Wet prep, genital     Status: Abnormal   Collection Time: 11/28/14 10:46 AM  Result Value Ref Range Status   Yeast Wet Prep HPF POC NONE SEEN NONE SEEN Final   Trich, Wet Prep NONE SEEN NONE SEEN Final   Clue Cells Wet Prep HPF POC FEW (A) NONE SEEN Final   WBC, Wet Prep HPF POC FEW (A) NONE SEEN Final  Chlamydia/NGC rt PCR ([facility] only)     Status: None   Collection Time: 11/28/14 10:46 AM  Result Value Ref Range Status   Specimen source GC/Chlam ENDOCERVICAL  Final   Chlamydia Tr NOT DETECTED NOT DETECTED Final   N gonorrhoeae NOT DETECTED NOT DETECTED Final    Comment: (NOTE) 100  This methodology has not been evaluated in pregnant women or in 200   patients with a history of hysterectomy. 300 400  This methodology will not be performed on patients less than 31  years of age.     Assessment   29 y.o. HD2 TOA and anemia  Plan   1) TOA - continue cefoxitin/doxycyline - WBC stable slightly up from yesterday - symptomatically improving  2) Anemia - additional unit of packed RBC with post-transfusion CBC

## 2014-11-30 ENCOUNTER — Inpatient Hospital Stay: Payer: 59

## 2014-11-30 DIAGNOSIS — I10 Essential (primary) hypertension: Secondary | ICD-10-CM | POA: Diagnosis present

## 2014-11-30 DIAGNOSIS — D72829 Elevated white blood cell count, unspecified: Secondary | ICD-10-CM | POA: Diagnosis present

## 2014-11-30 DIAGNOSIS — D259 Leiomyoma of uterus, unspecified: Secondary | ICD-10-CM | POA: Diagnosis present

## 2014-11-30 DIAGNOSIS — K429 Umbilical hernia without obstruction or gangrene: Secondary | ICD-10-CM | POA: Diagnosis present

## 2014-11-30 DIAGNOSIS — R1900 Intra-abdominal and pelvic swelling, mass and lump, unspecified site: Secondary | ICD-10-CM | POA: Diagnosis present

## 2014-11-30 DIAGNOSIS — N809 Endometriosis, unspecified: Secondary | ICD-10-CM | POA: Diagnosis present

## 2014-11-30 DIAGNOSIS — R1084 Generalized abdominal pain: Secondary | ICD-10-CM | POA: Diagnosis present

## 2014-11-30 DIAGNOSIS — N7093 Salpingitis and oophoritis, unspecified: Secondary | ICD-10-CM | POA: Diagnosis present

## 2014-11-30 DIAGNOSIS — D500 Iron deficiency anemia secondary to blood loss (chronic): Secondary | ICD-10-CM | POA: Diagnosis present

## 2014-11-30 DIAGNOSIS — R531 Weakness: Secondary | ICD-10-CM | POA: Diagnosis present

## 2014-11-30 LAB — CBC
HCT: 25.2 % — ABNORMAL LOW (ref 35.0–47.0)
Hemoglobin: 7.6 g/dL — ABNORMAL LOW (ref 12.0–16.0)
MCH: 19.7 pg — ABNORMAL LOW (ref 26.0–34.0)
MCHC: 30 g/dL — ABNORMAL LOW (ref 32.0–36.0)
MCV: 65.9 fL — ABNORMAL LOW (ref 80.0–100.0)
Platelets: 249 10*3/uL (ref 150–440)
RBC: 3.83 MIL/uL (ref 3.80–5.20)
RDW: 29.6 % — ABNORMAL HIGH (ref 11.5–14.5)
WBC: 18.7 10*3/uL — ABNORMAL HIGH (ref 3.6–11.0)

## 2014-11-30 LAB — CBC WITH DIFFERENTIAL/PLATELET
Basophils Absolute: 0 10*3/uL (ref 0–0.1)
Basophils Relative: 0 %
Eosinophils Absolute: 0.4 10*3/uL (ref 0–0.7)
Eosinophils Relative: 2 %
HCT: 24.2 % — ABNORMAL LOW (ref 35.0–47.0)
Hemoglobin: 7.3 g/dL — ABNORMAL LOW (ref 12.0–16.0)
Lymphocytes Relative: 4 %
Lymphs Abs: 0.8 10*3/uL — ABNORMAL LOW (ref 1.0–3.6)
MCH: 19.9 pg — ABNORMAL LOW (ref 26.0–34.0)
MCHC: 30.1 g/dL — ABNORMAL LOW (ref 32.0–36.0)
MCV: 66 fL — ABNORMAL LOW (ref 80.0–100.0)
Monocytes Absolute: 0.9 10*3/uL (ref 0.2–0.9)
Monocytes Relative: 5 %
Neutro Abs: 16.1 10*3/uL — ABNORMAL HIGH (ref 1.4–6.5)
Neutrophils Relative %: 89 %
Platelets: 233 10*3/uL (ref 150–440)
RBC: 3.67 MIL/uL — ABNORMAL LOW (ref 3.80–5.20)
RDW: 28.9 % — ABNORMAL HIGH (ref 11.5–14.5)
WBC: 18.2 10*3/uL — ABNORMAL HIGH (ref 3.6–11.0)

## 2014-11-30 LAB — TYPE AND SCREEN
ABO/RH(D): O NEG
Antibody Screen: NEGATIVE
Unit division: 0
Unit division: 0

## 2014-11-30 MED ORDER — DOCUSATE SODIUM 100 MG PO CAPS
100.0000 mg | ORAL_CAPSULE | Freq: Two times a day (BID) | ORAL | Status: DC
  Administered 2014-11-30 – 2014-12-01 (×2): 100 mg via ORAL
  Filled 2014-11-30 (×2): qty 1

## 2014-11-30 MED ORDER — ACETAMINOPHEN 325 MG PO TABS
650.0000 mg | ORAL_TABLET | Freq: Four times a day (QID) | ORAL | Status: DC | PRN
  Administered 2014-12-01: 650 mg via ORAL
  Filled 2014-11-30: qty 2

## 2014-11-30 MED ORDER — ACETAMINOPHEN 325 MG PO TABS
325.0000 mg | ORAL_TABLET | Freq: Once | ORAL | Status: AC
  Administered 2014-11-30: 325 mg via ORAL

## 2014-11-30 MED ORDER — SODIUM CHLORIDE 0.9 % IV SOLN
INTRAVENOUS | Status: DC
  Administered 2014-11-30: 21:00:00 via INTRAVENOUS

## 2014-11-30 MED ORDER — IBUPROFEN 600 MG PO TABS: 600.0000 mg | ORAL_TABLET | Freq: Four times a day (QID) | ORAL | Status: DC | PRN

## 2014-11-30 MED ORDER — FERROUS GLUCONATE 324 (38 FE) MG PO TABS
324.0000 mg | ORAL_TABLET | Freq: Two times a day (BID) | ORAL | Status: DC
  Administered 2014-11-30 – 2014-12-01 (×3): 324 mg via ORAL
  Filled 2014-11-30 (×4): qty 1

## 2014-11-30 MED ORDER — OXYCODONE HCL 5 MG PO TABS
5.0000 mg | ORAL_TABLET | Freq: Four times a day (QID) | ORAL | Status: DC | PRN
  Administered 2014-12-01 (×2): 10 mg via ORAL
  Filled 2014-11-30 (×2): qty 2

## 2014-11-30 NOTE — Progress Notes (Signed)
GYN Update Note  Current Vital Signs 24h Vital Sign Ranges  T (!) 103.1 F (39.5 C) Temp  Avg: 100.1 F (37.8 C)  Min: 98 F (36.7 C)  Max: 103.1 F (39.5 C)  BP 135/74 mmHg BP  Min: 107/53  Max: 135/74  HR 100 Pulse  Avg: 95.6  Min: 91  Max: 100  RR 18 Resp  Avg: 19  Min: 18  Max: 20  SaO2 100 % Not Delivered SpO2  Avg: 99 %  Min: 98 %  Max: 100 %       24 Hour I/O Current Shift I/O  Time Ins Outs 09/22 0701 - 09/23 0700 In: 2827 [P.O.:840; I.V.:1587] Out: 625 [Urine:625]     Patient Vitals for the past 12 hrs:  BP Temp Temp src Pulse Resp  11/30/14 1751 - (!) 103.1 F (39.5 C) Oral - -  11/30/14 1720 - (!) 100.8 F (38.2 C) Oral - -  11/30/14 1607 135/74 mmHg (!) 102.3 F (39.1 C) Oral 100 18  11/30/14 1152 122/68 mmHg 98.7 F (37.1 C) Oral 91 18    Patient eating dinner and states that she feels better and that fever has past (CNA coming around to take temp currently). No chest pain, SOB, fevers, chills or pain.  NAD RRR no MRGs CTAB No CVAT Abd: obese, soft, nttp   Recent Labs Lab 11/29/14 1530 11/30/14 0647 11/30/14 1807  WBC 19.7* 18.2* 18.7*  HGB 7.8* 7.3* 7.6*  HCT 25.3* 24.2* 25.2*  PLT 217 233 249   BCx x 2: pending UCx ordered  A/P: pt stable Follow up PM temp D/w pt regarding clinical course and if WBC is downtrending well and/or continues to have spikes in temps, will call IR over the weekend for drainage. Unable to find op note in Care Everywhere and pt doesn't recognize word "morcellation." I d/w her that depending on how they removed them during her robot case that right abdominal mass could be fibroid but at some point she'll likely need surgery, in order to evaluate. I told her that if we are at point where she needs drainage and that IR is unable to do it, then will need surgery, which has many risks associated with it, given her prior surgery and current inflammatory state. I'd recommend having GYN Onc available during the case. Patient  amenable to plan and will see how her clinical course develops.  Durene Romans MD [facility]  Pager: [phone]

## 2014-11-30 NOTE — Progress Notes (Signed)
RN called Dr Ilda Basset inrregards to patients temperature. Her oral temp was 102, 100.8, then 103.1.  MD advised to give 325 Tylenol on top of 2 percocet, and recheck in approx. 30 mins. Applied warm blankets to patient per her request.

## 2014-11-30 NOTE — Progress Notes (Addendum)
Gynecology Progress Note  Admission Date: 11/28/2014 Current Date: 11/30/2014  Lynn Boyd is a 29 y.o. P0 (LMP ongoing) HD#3 admitted for pain and possible TOA  History complicated by HTN, h/o chronic anemia, fibroid uterus, h/o RA myomectomy @ [facility] in 2012  ROS and patient/family/surgical history, located on admission H&P note dated 11/28/2014, have been reviewed, and there are no changes except as noted below Yesterday/Overnight Events:  Patient states that her period is ongoing (not heavy or painful)  Subjective:  As above. No fevers, chills, nausea, vomiting, chest pain, SOB, has had BM with help of prunes. Eating and drinking fine  Objective:    Current Vital Signs 24h Vital Sign Ranges  T 99.2 F (37.3 C) Temp  Avg: 98.6 F (37 C)  Min: 98 F (36.7 C)  Max: 99.5 F (37.5 C)  BP 123/65 mmHg BP  Min: 101/45  Max: 123/65  HR 97 Pulse  Avg: 98.7  Min: 91  Max: 109  RR 20 Resp  Avg: 18  Min: 16  Max: 20  SaO2 100 % Not Delivered SpO2  Avg: 99.2 %  Min: 98 %  Max: 100 %       24 Hour I/O Current Shift I/O  Time Ins Outs 09/22 0701 - 09/23 0700 In: 2827 [P.O.:840; I.V.:1587] Out: 625 [Urine:625]     Patient Vitals for the past 24 hrs:  BP Temp Temp src Pulse Resp SpO2  11/30/14 0751 123/65 mmHg 99.2 F (37.3 C) Oral 97 - 100 %  11/30/14 0357 123/66 mmHg 98.4 F (36.9 C) Oral 98 20 -  11/30/14 0026 (!) 107/53 mmHg 98 F (36.7 C) Oral 92 20 98 %  11/29/14 1935 (!) 115/55 mmHg 98.3 F (36.8 C) Oral 96 20 99 %  11/29/14 1613 - 98.1 F (36.7 C) Oral - - -  11/29/14 1207 (!) 101/45 mmHg 99.1 F (37.3 C) Oral 91 16 98 %  11/29/14 0942 (!) 115/51 mmHg 99.5 F (37.5 C) Oral (!) 109 16 100 %  11/29/14 0922 (!) 104/50 mmHg 98.4 F (36.9 C) Oral (!) 108 16 100 %    Physical exam: General appearance: alert, cooperative and appears stated age Abdomen: obese, mildly distended, mildly ttp in supraumbilical area but no peritoneal s/s, +BS GU: No gross VB Lungs: clear to  auscultation bilaterally Heart: regular rate and rhythm and no MRGs Extremities: no c/c/e Psych: appropriate Neurologic: Grossly normal  Medications Current Facility-Administered Medications  Medication Dose Route Frequency Provider Last Rate Last Dose  . cefOXitin (MEFOXIN) 2 g in dextrose 5 % 50 mL IVPB  2 g Intravenous 4 times per day Malachy Mood, MD   2 g at 11/30/14 0242  . dextrose 5 %-0.45 % sodium chloride infusion   Intravenous Continuous Malachy Mood, MD 125 mL/hr at 11/30/14 [id number]    . doxycycline (VIBRA-TABS) tablet 100 mg  100 mg Oral Q12H Malachy Mood, MD   100 mg at 11/30/14 0349  . ketorolac (TORADOL) 30 MG/ML injection 30 mg  30 mg Intravenous 4 times per day Malachy Mood, MD   30 mg at 11/30/14 0349   Or  . ketorolac (TORADOL) 30 MG/ML injection 30 mg  30 mg Intramuscular 4 times per day Malachy Mood, MD      . ondansetron [facility]) tablet 4 mg  4 mg Oral Q6H PRN Malachy Mood, MD       Or  . ondansetron (ZOFRAN) injection 4 mg  4 mg Intravenous Q6H PRN Malachy Mood, MD      .  oxyCODONE-acetaminophen (PERCOCET/ROXICET) 5-325 MG per tablet 1-2 tablet  1-2 tablet Oral Q4H PRN Dalia Heading, CNM   2 tablet at 11/29/14 1704  . prenatal multivitamin tablet 1 tablet  1 tablet Oral Q1200 Malachy Mood, MD   1 tablet at 11/29/14 2206  . simethicone (MYLICON) chewable tablet 80 mg  80 mg Oral PRN Malachy Mood, MD   80 mg at 11/29/14 1515    Recent Labs Lab 11/29/14 0641 11/29/14 1530 11/30/14 0647  WBC 16.9* 19.7* 18.2*  HGB 6.3* 7.8* 7.3*  HCT 21.4* 25.3* 24.2*  PLT 240 217 233     Recent Labs Lab 11/28/14 0656  NA 136  K 3.4*  CL 103  CO2 25  BUN 16  CREATININE 0.96  CALCIUM 9.1  PROT 7.5  BILITOT 0.4  ALKPHOS 44  ALT 10*  AST 23  GLUCOSE 118*   Negative: wet prep, GC/CT, u/a, UPT, lipase  Radiology EXAM: CT ABDOMEN AND PELVIS WITH CONTRAST  TECHNIQUE: Multidetector CT imaging of the abdomen and pelvis was  performed using the standard protocol following bolus administration of intravenous contrast.  CONTRAST: 142mL OMNIPAQUE IOHEXOL 300 MG/ML SOLN  COMPARISON: None  FINDINGS: There is cardiomegaly. Dependent opacities in both lung bases, likely atelectasis. No effusions.  Mild diffuse fatty infiltration of the liver. No focal abnormality. Gallbladder, pancreas, spleen, adrenals and kidneys are normal.  Multiple masses noted within the uterus compatible with fibroids. There is moderate free fluid in the pelvis. Fluid also noted adjacent to the liver edge. Complex fluid collection noted in the cul-de-sac measuring 3.8 cm in greatest diameter. Findings most compatible with pelvic inflammatory disease and tubo-ovarian abscess.  There is a solid avidly enhancing heterogeneous mass in the right abdomen. This is inferior to the liver edge adjacent to right small bowel loops and the ascending colon, but does not appear to arise from any of these structures. This appears to be within the mesentery. This measures 5.6 x 4.7 cm on image 47.  Small umbilical hernia containing fat. No acute bony abnormality or focal bone lesion.  IMPRESSION: Uterine fibroids.  Complex fluid collection in the cul-de-sac with moderate free fluid in the pelvis and adjacent to the liver edge. Findings most compatible with tubo-ovarian abscess.  5.6 cm avidly enhancing mesenteric mass in the right abdomen. This does not appear to arise from bowel. 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 Soft tissue sarcoma or GIST tumor possible but felt less likely given its appearance and location. Recommend tissue sampling for resection.  Small umbilical hernia containing fat.   Electronically Signed  By: Rolm Baptise M.D.  On: 11/28/2014 10:15  Assessment & Plan:  Pt stable *GYN: Unable to find op note on care everywhere but looks like she had RA-myomectomy and peritoneal  biopsies (negative, but clinically c/w endo). They stated 1-5cm fibroids were removed but no mention of how they were removed and no mention of morcellation. Will get pelvic and abdominal u/s to better delineate abdomino-pelvic masses. WBC was trending up and now down today. Will continue with cefoxitin/doxycycline regimen D#3, and hope to avoid surgery and possible IR evaluation if drainage is deemed necessary. Depending on u/s, may have [facility] come by and evaluate *Anemia: pt states that period should be done by tomorrow (usually 7d and qmonth, regular, painful and heavy first few days). H/H stable today -s/p 2u PRBCs on admission *HTN: no issues *Pain: controlled with PO meds; will d/c toradol *FEN/GI: regular diet, will d/c MIVF *PPx: OOB  encouraged *Dispo: depending on imaging and labs  Code Status: Full Code  Durene Romans MD [facility] Pager: [phone]

## 2014-12-01 LAB — CBC WITH DIFFERENTIAL/PLATELET
Basophils Absolute: 0 10*3/uL (ref 0–0.1)
Basophils Relative: 0 %
Eosinophils Absolute: 0.4 10*3/uL (ref 0–0.7)
Eosinophils Relative: 3 %
HCT: 23.4 % — ABNORMAL LOW (ref 35.0–47.0)
Hemoglobin: 7 g/dL — ABNORMAL LOW (ref 12.0–16.0)
Lymphocytes Relative: 6 %
Lymphs Abs: 0.9 10*3/uL — ABNORMAL LOW (ref 1.0–3.6)
MCH: 19.8 pg — ABNORMAL LOW (ref 26.0–34.0)
MCHC: 29.8 g/dL — ABNORMAL LOW (ref 32.0–36.0)
MCV: 66.4 fL — ABNORMAL LOW (ref 80.0–100.0)
Monocytes Absolute: 1 10*3/uL — ABNORMAL HIGH (ref 0.2–0.9)
Monocytes Relative: 7 %
Neutro Abs: 12 10*3/uL — ABNORMAL HIGH (ref 1.4–6.5)
Neutrophils Relative %: 84 %
Platelets: 212 10*3/uL (ref 150–440)
RBC: 3.52 MIL/uL — ABNORMAL LOW (ref 3.80–5.20)
RDW: 29.7 % — ABNORMAL HIGH (ref 11.5–14.5)
WBC: 14.3 10*3/uL — ABNORMAL HIGH (ref 3.6–11.0)

## 2014-12-01 NOTE — Progress Notes (Signed)
Eliezer Lofts at [facility] has called and stated Pt. Is to be Transferred to Room 6325 in [facility]. I have called the Unit and I am waiting for Arlinda to call me back to receive report on this Pt. I have informed the Pt.she has been accepted for Transfer and I have called [facility] to arrange transportation. Awaiting the arrival of EMS.

## 2014-12-01 NOTE — Progress Notes (Signed)
[facility] here to Transfer Pt. To [facility]. I have given report to Sheppard Evens RN who denies questions. Pt. States understanding for need of transfer and agrees to being transferred to [facility]. She is alert and oriented with cheerful affect. Color good, skin w&d. See Nursing Assessment. EMS Transport Personnel are: Daphine Deutscher and Carmel [place].

## 2014-12-01 NOTE — Progress Notes (Signed)
Transfer Note  Date of admission: 11/28/2014 Date of transfer: 12/01/2014 Receiving facility: [facility] Receiving attending physician: Dr. Vilinda Blanks Reason for transfer: Availability of diagnostic and procedural services not available at [facility].  Additionally, there is the availability of sub-specialists, if needed.  Chief Complaint: Abdominal Pain  History of Present Illness: Patient is a 29 y.o. G0 presenting to the ER with 24-hr history of abdominal pain. The patient started feeling ill yesterday evening, felt like she has a low grade fever when she woke up. Her mother had stayed with her the day before and had a URI so she attributed that to her initial symptoms. She then began developing lower abdominal pain. No associated changes in bowl movements or micturition. She has had some mild nausea, no emesis, no vaginal discharge. Currently on her menses. Denies history of STI or PID in the past.   Patient currently on her menses, has had history of heavy menses, better since her myomectomy but first 2 days are still heavy. Lasts for up to 7 days. Declines contraception for management of her menses.  Past Medical History  Diagnosis Date  . Chronic blood loss anemia   . Fibroid uterus   . Hypertension    Past Surgical History  Procedure Laterality Date  . Robot assisted myomectomy  05/2010    [facility]   Medications Prior to Admission  Medication Sig Dispense Refill  . hydrochlorothiazide (HYDRODIURIL) 25 MG tablet Take 25 mg by mouth daily.    . naproxen sodium (ANAPROX) 220 MG tablet Take 440 mg by mouth every 8 (eight) hours as needed (menstrual cramps).    . Prenatal Vit-Fe Fumarate-FA (PRENATAL MULTIVITAMIN) TABS tablet Take 1 tablet by mouth daily at 12 noon.    . valACYclovir (VALTREX) 500 MG tablet Take 500 mg by mouth 2 (two) times daily as needed (flare-ups).     Allergies  Allergen Reactions  . Latex    Social History   Social History  .  Marital Status: Married    Spouse Name: N/A  . Number of Children: N/A  . Years of Education: N/A   Occupational History  . 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 Smoking status: Never Smoker   . Smokeless tobacco: Not on file  . Alcohol Use: No  . Drug Use: No  . Sexual Activity: Not on file   Other Topics Concern  . Not on file   Social History Narrative   History reviewed. No pertinent family history.  ROS: negative x 10 systems reviewed unless noted in HPI.  Hospital Course: Patient admitted for presumed right tubo-ovarian abscess.  She was started on IV cefoxitin and oral doxycycline.  She underwent a CT scan in the ER (note results below) on 9/21 and a pelvic ultrasound on 9/23.  On 11/30/14, she spiked a fever to 103.19F.  Blood and urine cultures were drawn.  She subsequently defervesced and remained afebrile. On 9/24 it was discussed with the patient that she might fail antibiotic treatment and that another intervention might be warranted.  The images were reviewed with the interventional radiology team from [facility] who did not believe that either an anterior or posterior approach would be feasible given the location of the fluid collection.  None of the interventional radiologists have experience with transvaginal drain placement.  Given the concern for the cause of her fever and abscess and the atypical findings on ultrasound and CT scan (right upper quadrant mass in  the mesentery, odd right adnexal mass with an internal fluid collection (?possible abscess), I discussed possible transfer to tertiary care center where she might be able to get an interventional radiologist who could place the drain transvaginally or otherwise.  Additionally, if she did require surgery, it would be beneficial to have general surgery be able to evaluate her mass in her right upper quadrant. Also, if her right adnexal mass appeared malignant, gynecologic oncology services would  be available.  The patient did agree to this plan and was stable for transfer. Over the course of her hospitalization she had an increase in her WBC count initially.  However, by hospital day #4 it began coming down.  Also of note, she presented with a hemoglobin of 6.1. She was transfused 2 units of pRBCs and responded appropriately.  After researching her history through "Care Everywhere" at [facility], she underwent robot assisted laparoscopic abdominal myomectomy of several fibroids (likely morcellated) in 2012.  There was no operative report found. However, subsequent visit notes indicate that endometriosis was found during her surgery and removed.  She essentially always has a hemoglobin the range of 6 mg/dL.  She has a history of pancytopenia that spontaneously resolved and was seen by a hematologist at [facility]. No bone marrow biopsy was performed.  She has a history of cardiomegaly (noted on CT scan). She has had an ECHO at [facility] that showed mild left ventricular enlargement.  Normal EF.  She also had cervical cultures which were negative for gonorrhea and chlamydia during this hospitalization.  Physical exam on date of transfer: BP 149/89 mmHg  Pulse 103  Temp(Src) 99.2 F (37.3 C) (Oral)  Resp 20  Ht '5\' 2"'  (1.575 m)  Wt 190 lb (86.183 kg)  BMI 34.74 kg/m2  SpO2 100%  LMP 11/24/2014  Gen: NAD CV: RRR Pulm: CTAB Abd: soft, mildly tender to palpation just above the umbilicus and in the RLQ. No rebound or guarding. +BS. Ext: no e/c/t   Recent Labs Lab 11/28/14 0656 11/29/14 0641 11/29/14 1530 11/30/14 0647 11/30/14 1807 12/01/14 0437  WBC 15.7* 16.9* 19.7* 18.2* 18.7* 14.3*  HGB 6.1* 6.3* 7.8* 7.3* 7.6* 7.0*  HCT 21.1* 21.4* 25.3* 24.2* 25.2* 23.4*  PLT 293 240 217 233 249 212  NA 136  --   --   --   --   --   K 3.4*  --   --   --   --   --   CREATININE 0.96  --   --   --   --   --    Imaging reports: CT scan abdomen/pelvis 11/28/2014 (report)  CLINICAL DATA: Chills, generalized  abdominal pain. Leukocytosis.  EXAM: CT ABDOMEN AND PELVIS WITH CONTRAST  TECHNIQUE: Multidetector CT imaging of the abdomen and pelvis was performed using the standard protocol following bolus administration of intravenous contrast.  CONTRAST: 120m OMNIPAQUE IOHEXOL 300 MG/ML SOLN  COMPARISON: None  FINDINGS: There is cardiomegaly. Dependent opacities in both lung bases, likely atelectasis. No effusions.  Mild diffuse fatty infiltration of the liver. No focal abnormality. Gallbladder, pancreas, spleen, adrenals and kidneys are normal.  Multiple masses noted within the uterus compatible with fibroids. There is moderate free fluid in the pelvis. Fluid also noted adjacent to the liver edge. Complex fluid collection noted in the cul-de-sac measuring 3.8 cm in greatest diameter. Findings most compatible with pelvic inflammatory disease and tubo-ovarian abscess.  There is a solid avidly enhancing heterogeneous mass in the right abdomen. This is inferior to the  liver edge adjacent to right small bowel loops and the ascending colon, but does not appear to arise from any of these structures. This appears to be within the mesentery. This measures 5.6 x 4.7 cm on image 47.  Small umbilical hernia containing fat. No acute bony abnormality or focal bone lesion.  IMPRESSION: Uterine fibroids.  Complex fluid collection in the cul-de-sac with moderate free fluid in the pelvis and adjacent to the liver edge. Findings most compatible with tubo-ovarian abscess.  5.6 cm avidly enhancing mesenteric mass in the right abdomen. This does not appear to arise from bowel. 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 Soft tissue sarcoma or GIST tumor possible but felt less likely given its appearance and location. Recommend tissue sampling for resection.  Small umbilical hernia containing fat.  Pelvic Ultrasound Report (11/30/14): CLINICAL DATA:  Fibroids. History of endometriomas.  EXAM: TRANSABDOMINAL AND TRANSVAGINAL ULTRASOUND OF PELVIS, LIMITED ULTRASOUND THE ABDOMEN.  TECHNIQUE: Both transabdominal and transvaginal ultrasound examinations of the pelvis were performed. Transabdominal technique was performed for global imaging of the pelvis including uterus, ovaries, adnexal regions, and pelvic cul-de-sac. It was necessary to proceed with endovaginal exam following the transabdominal exam to visualize the uterus and ovaries.  COMPARISON: CT 11/28/2014  FINDINGS: Uterus  Measurements: 11.6 x 5.1 x 10.3 cm. A 5.0 cm fibroid noted in the fundus, a 5.0 cm fibroid noted in the left body. A 9.1 x 6.3 x 6.7 cm  Endometrium  Thickness: 9 mm. No focal abnormality visualized.  Right ovary  Not visualized. There is a complex mass in the right adnexa measuring approximately 9 x 7 cm. This to be consistent with prior CT finding and could represent a process such as tubo-ovarian abscess, endometrioma, or other ovarian mass lesion. If patient is pregnant ectopic pregnancy cannot be excluded.  Left ovary  Measurements: 4.9 x 4.3 x 4.4 cm. Normal appearance/no adnexal mass.  Other findings  What appears to be a loculated 3.7 x 3.9 cm fluid collection is noted in the right adnexal region. This could represent a abscess.  Limited ultrasound abdomen reveals a hypoechoic solid-appearing 3.6 x 5.4 x 5.7 cm mass in the right subhepatic region corresponding to CT abnormality.  IMPRESSION: 1. Fibroid uterus. 2. Large right adnexal mass measuring approximately 9 x 7 cm. This could represent a tubo-ovarian abscess, endometrioma, or other ovarian mass lesions and correlates with CT findings. 3. Loculated 3.9 cm fluid collection in the right adnexal region. This could represent an abscess. 4. Limited ultrasound of the abdomen reveals a 3.6 x 5.4 x 5.7 cm hypoechoic solid-appearing mass in the subhepatic  region corresponding to prior CT abnormality.  Condition at Transfer: Stable  Disposition: Transfer to [facility] via EMS  Will Bonnet, MD, [facility] 12/01/2014 7:05 PM

## 2014-12-02 LAB — URINE CULTURE: Culture: NO GROWTH

## 2014-12-02 NOTE — Discharge Summary (Signed)
Discharge Summary  Date of admission: 11/28/2014 Date of transfer: 12/01/2014 Receiving facility: [facility] Receiving attending physician: Dr. Vilinda Blanks Reason for transfer: Availability of diagnostic and procedural services not available at [facility]. Additionally, there is the availability of sub-specialists, if needed.  Chief Complaint: Abdominal Pain  History of Present Illness: Patient is a 29 y.o. G0 presenting to the ER with 24-hr history of abdominal pain. The patient started feeling ill yesterday evening, felt like she has a low grade fever when she woke up. Her mother had stayed with her the day before and had a URI so she attributed that to her initial symptoms. She then began developing lower abdominal pain. No associated changes in bowl movements or micturition. She has had some mild nausea, no emesis, no vaginal discharge. Currently on her menses. Denies history of STI or PID in the past.   Patient currently on her menses, has had history of heavy menses, better since her myomectomy but first 2 days are still heavy. Lasts for up to 7 days. Declines contraception for management of her menses.  Past Medical History  Diagnosis Date  . Chronic blood loss anemia   . Fibroid uterus   . Hypertension    Past Surgical History  Procedure Laterality Date  . Robot assisted myomectomy  05/2010    [facility]   Medications Prior to Admission  Medication Sig Dispense Refill  . hydrochlorothiazide (HYDRODIURIL) 25 MG tablet Take 25 mg by mouth daily.    . naproxen sodium (ANAPROX) 220 MG tablet Take 440 mg by mouth every 8 (eight) hours as needed (menstrual cramps).    . Prenatal Vit-Fe Fumarate-FA (PRENATAL MULTIVITAMIN) TABS tablet Take 1 tablet by mouth daily at 12 noon.    . valACYclovir (VALTREX) 500 MG tablet Take 500 mg by mouth 2 (two) times daily as needed (flare-ups).     Allergies  Allergen Reactions   . Latex    Social History   Social History  . Marital Status: Married    Spouse Name: N/A  . Number of Children: N/A  . Years of Education: N/A   Occupational History  . 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 Smoking status: Never Smoker   . Smokeless tobacco: Not on file  . Alcohol Use: No  . Drug Use: No  . Sexual Activity: Not on file   Other Topics Concern  . Not on file   Social History Narrative   History reviewed. No pertinent family history.  ROS: negative x 10 systems reviewed unless noted in HPI.  Hospital Course: Patient admitted for presumed right tubo-ovarian abscess. She was started on IV cefoxitin and oral doxycycline. She underwent a CT scan in the ER (note results below) on 9/21 and a pelvic ultrasound on 9/23. On 11/30/14, she spiked a fever to 103.73F. Blood and urine cultures were drawn. She subsequently defervesced and remained afebrile. On 9/24 it was discussed with the patient that she might fail antibiotic treatment and that another intervention might be warranted. The images were reviewed with the interventional radiology team from [facility] who did not believe that either an anterior or posterior approach would be feasible given the location of the fluid collection. None of the interventional radiologists have experience with transvaginal drain placement. Given the concern for the cause of her fever and abscess and the atypical findings on ultrasound and CT scan (right upper quadrant mass in the mesentery, odd right adnexal mass with an internal  fluid collection (?possible abscess), I discussed possible transfer to tertiary care center where she might be able to get an interventional radiologist who could place the drain transvaginally or otherwise. Additionally, if she did require surgery, it would be beneficial to have general surgery be able to evaluate her mass in her right  upper quadrant. Also, if her right adnexal mass appeared malignant, gynecologic oncology services would be available. The patient did agree to this plan and was stable for transfer. Over the course of her hospitalization she had an increase in her WBC count initially. However, by hospital day #4 it began coming down. Also of note, she presented with a hemoglobin of 6.1. She was transfused 2 units of pRBCs and responded appropriately. After researching her history through "Care Everywhere" at [facility], she underwent robot assisted laparoscopic abdominal myomectomy of several fibroids (likely morcellated) in 2012. There was no operative report found. However, subsequent visit notes indicate that endometriosis was found during her surgery and removed. She essentially always has a hemoglobin the range of 6 mg/dL. She has a history of pancytopenia that spontaneously resolved and was seen by a hematologist at [facility]. No bone marrow biopsy was performed. She has a history of cardiomegaly (noted on CT scan). She has had an ECHO at [facility] that showed mild left ventricular enlargement. Normal EF. She also had cervical cultures which were negative for gonorrhea and chlamydia during this hospitalization.  Physical exam on date of transfer: BP 149/89 mmHg  Pulse 103  Temp(Src) 99.2 F (37.3 C) (Oral)  Resp 20  Ht $R'5\' 2"'lb$  (1.575 m)  Wt 190 lb (86.183 kg)  BMI 34.74 kg/m2  SpO2 100%  LMP 11/24/2014  Gen: NAD CV: RRR Pulm: CTAB Abd: soft, mildly tender to palpation just above the umbilicus and in the RLQ. No rebound or guarding. +BS. Ext: no e/c/t   Last Labs      Recent Labs Lab 11/28/14 0656 11/29/14 0641 11/29/14 1530 11/30/14 0647 11/30/14 1807 12/01/14 0437  WBC 15.7* 16.9* 19.7* 18.2* 18.7* 14.3*  HGB 6.1* 6.3* 7.8* 7.3* 7.6* 7.0*  HCT 21.1* 21.4* 25.3* 24.2* 25.2* 23.4*  PLT 293 240 217 233 249 212  NA 136 --  --  --  --  --   K 3.4* --  --   --  --  --   CREATININE 0.96 --  --  --  --  --      Imaging reports: CT scan abdomen/pelvis 11/28/2014 (report)  CLINICAL DATA: Chills, generalized abdominal pain. Leukocytosis.  EXAM: CT ABDOMEN AND PELVIS WITH CONTRAST  TECHNIQUE: Multidetector CT imaging of the abdomen and pelvis was performed using the standard protocol following bolus administration of intravenous contrast.  CONTRAST: 174mL OMNIPAQUE IOHEXOL 300 MG/ML SOLN  COMPARISON: None  FINDINGS: There is cardiomegaly. Dependent opacities in both lung bases, likely atelectasis. No effusions.  Mild diffuse fatty infiltration of the liver. No focal abnormality. Gallbladder, pancreas, spleen, adrenals and kidneys are normal.  Multiple masses noted within the uterus compatible with fibroids. There is moderate free fluid in the pelvis. Fluid also noted adjacent to the liver edge. Complex fluid collection noted in the cul-de-sac measuring 3.8 cm in greatest diameter. Findings most compatible with pelvic inflammatory disease and tubo-ovarian abscess.  There is a solid avidly enhancing heterogeneous mass in the right abdomen. This is inferior to the liver edge adjacent to right small bowel loops and the ascending colon, but does not appear to arise from any of these structures. This appears to  be within the mesentery. This measures 5.6 x 4.7 cm on image 47.  Small umbilical hernia containing fat. No acute bony abnormality or focal bone lesion.  IMPRESSION: Uterine fibroids.  Complex fluid collection in the cul-de-sac with moderate free fluid in the pelvis and adjacent to the liver edge. Findings most compatible with tubo-ovarian abscess.  5.6 cm avidly enhancing mesenteric mass in the right abdomen. This does not appear to arise from bowel. 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 Soft tissue sarcoma or GIST tumor possible but felt less likely given its  appearance and location. Recommend tissue sampling for resection.  Small umbilical hernia containing fat.  Pelvic Ultrasound Report (11/30/14): CLINICAL DATA: Fibroids. History of endometriomas.  EXAM: TRANSABDOMINAL AND TRANSVAGINAL ULTRASOUND OF PELVIS, LIMITED ULTRASOUND THE ABDOMEN.  TECHNIQUE: Both transabdominal and transvaginal ultrasound examinations of the pelvis were performed. Transabdominal technique was performed for global imaging of the pelvis including uterus, ovaries, adnexal regions, and pelvic cul-de-sac. It was necessary to proceed with endovaginal exam following the transabdominal exam to visualize the uterus and ovaries.  COMPARISON: CT 11/28/2014  FINDINGS: Uterus  Measurements: 11.6 x 5.1 x 10.3 cm. A 5.0 cm fibroid noted in the fundus, a 5.0 cm fibroid noted in the left body. A 9.1 x 6.3 x 6.7 cm  Endometrium  Thickness: 9 mm. No focal abnormality visualized.  Right ovary  Not visualized. There is a complex mass in the right adnexa measuring approximately 9 x 7 cm. This to be consistent with prior CT finding and could represent a process such as tubo-ovarian abscess, endometrioma, or other ovarian mass lesion. If patient is pregnant ectopic pregnancy cannot be excluded.  Left ovary  Measurements: 4.9 x 4.3 x 4.4 cm. Normal appearance/no adnexal mass.  Other findings  What appears to be a loculated 3.7 x 3.9 cm fluid collection is noted in the right adnexal region. This could represent a abscess.  Limited ultrasound abdomen reveals a hypoechoic solid-appearing 3.6 x 5.4 x 5.7 cm mass in the right subhepatic region corresponding to CT abnormality.  IMPRESSION: 1. Fibroid uterus. 2. Large right adnexal mass measuring approximately 9 x 7 cm. This could represent a tubo-ovarian abscess, endometrioma, or other ovarian mass lesions and correlates with CT findings. 3. Loculated 3.9 cm fluid collection in the right adnexal  region. This could represent an abscess. 4. Limited ultrasound of the abdomen reveals a 3.6 x 5.4 x 5.7 cm hypoechoic solid-appearing mass in the subhepatic region corresponding to prior CT abnormality.  Condition at Transfer: Stable  Disposition: Transfer to [facility] via EMS  Will Bonnet, MD, [facility] 12/01/2014 7:05 PM

## 2014-12-05 LAB — CULTURE, BLOOD (ROUTINE X 2)
Culture: NO GROWTH
Culture: NO GROWTH

## 2014-12-18 DIAGNOSIS — Z8742 Personal history of other diseases of the female genital tract: Secondary | ICD-10-CM | POA: Insufficient documentation

## 2015-03-10 DIAGNOSIS — R87629 Unspecified abnormal cytological findings in specimens from vagina: Secondary | ICD-10-CM

## 2015-03-10 HISTORY — DX: Unspecified abnormal cytological findings in specimens from vagina: R87.629

## 2015-10-03 ENCOUNTER — Emergency Department: Payer: 59

## 2015-10-03 ENCOUNTER — Encounter: Payer: Self-pay | Admitting: Emergency Medicine

## 2015-10-03 ENCOUNTER — Emergency Department
Admission: EM | Admit: 2015-10-03 | Discharge: 2015-10-03 | Disposition: A | Discharge status: Home / Self Care | Payer: 59 | Attending: Emergency Medicine | Admitting: Emergency Medicine

## 2015-10-03 DIAGNOSIS — Z791 Long term (current) use of non-steroidal anti-inflammatories (NSAID): Secondary | ICD-10-CM | POA: Diagnosis not present

## 2015-10-03 DIAGNOSIS — N939 Abnormal uterine and vaginal bleeding, unspecified: Secondary | ICD-10-CM | POA: Diagnosis not present

## 2015-10-03 DIAGNOSIS — I10 Essential (primary) hypertension: Secondary | ICD-10-CM | POA: Diagnosis not present

## 2015-10-03 DIAGNOSIS — D649 Anemia, unspecified: Secondary | ICD-10-CM | POA: Diagnosis not present

## 2015-10-03 DIAGNOSIS — R1909 Other intra-abdominal and pelvic swelling, mass and lump: Secondary | ICD-10-CM | POA: Diagnosis not present

## 2015-10-03 DIAGNOSIS — R58 Hemorrhage, not elsewhere classified: Secondary | ICD-10-CM

## 2015-10-03 DIAGNOSIS — N9489 Other specified conditions associated with female genital organs and menstrual cycle: Secondary | ICD-10-CM

## 2015-10-03 LAB — URINALYSIS COMPLETE WITH MICROSCOPIC (ARMC ONLY)
Bacteria, UA: NONE SEEN
Bilirubin Urine: NEGATIVE
Glucose, UA: NEGATIVE mg/dL
Ketones, ur: NEGATIVE mg/dL
Leukocytes, UA: NEGATIVE
Nitrite: NEGATIVE
Protein, ur: 30 mg/dL — AB
Specific Gravity, Urine: 1.02 (ref 1.005–1.030)
pH: 6 (ref 5.0–8.0)

## 2015-10-03 LAB — CBC WITH DIFFERENTIAL/PLATELET
Basophils Absolute: 0 10*3/uL (ref 0–0.1)
Basophils Relative: 1 %
Eosinophils Absolute: 0.2 10*3/uL (ref 0–0.7)
Eosinophils Relative: 3 %
HCT: 23.6 % — ABNORMAL LOW (ref 35.0–47.0)
Hemoglobin: 8.1 g/dL — ABNORMAL LOW (ref 12.0–16.0)
Lymphocytes Relative: 18 %
Lymphs Abs: 1 10*3/uL (ref 1.0–3.6)
MCH: 30.4 pg (ref 26.0–34.0)
MCHC: 34.1 g/dL (ref 32.0–36.0)
MCV: 89.1 fL (ref 80.0–100.0)
Monocytes Absolute: 0.3 10*3/uL (ref 0.2–0.9)
Monocytes Relative: 6 %
Neutro Abs: 4.1 10*3/uL (ref 1.4–6.5)
Neutrophils Relative %: 72 %
Platelets: 216 10*3/uL (ref 150–440)
RBC: 2.65 MIL/uL — ABNORMAL LOW (ref 3.80–5.20)
RDW: 14.6 % — ABNORMAL HIGH (ref 11.5–14.5)
WBC: 5.6 10*3/uL (ref 3.6–11.0)

## 2015-10-03 LAB — POCT PREGNANCY, URINE: Preg Test, Ur: NEGATIVE

## 2015-10-03 MED ORDER — DIATRIZOATE MEGLUMINE & SODIUM 66-10 % PO SOLN: 15.0000 mL | Freq: Once | ORAL | Status: DC

## 2015-10-03 NOTE — ED Provider Notes (Addendum)
[facility] Emergency Department Provider Note   ____________________________________________   First MD Initiated Contact with Patient 10/03/15 [phone]     (approximate)  I have reviewed the triage vital signs and the nursing notes.   HISTORY  Chief Complaint Vaginal Bleeding    HPI Lynn Boyd is a 30 y.o. female who presents to the ED from home with a chief complain of vaginal bleeding. Patient reports she was seen at [facility] almost one week agofor hematuria; diagnosed with UTI and prescribed Bactrim. Patient reports similar episodes previously and states she had better improvement of her hematuria with amoxicillin. Reports last menstrual period stopping 2 days prior. Presents with persistent bleeding which patient thinks is not her. Blood as it is brighter red. Denies associated fever, chills, chest pain, shortness of breath, abdominal pain, back pain, dysuria. Denies recent travel or trauma. Nothing makes her symptoms better or worse. Reports anemia requiring IV iron transfusions previously.   Past Medical History:  Diagnosis Date  . Chronic blood loss anemia   . Fibroid uterus   . Hypertension     Patient Active Problem List   Diagnosis Date Noted  . TOA (tubo-ovarian abscess) 11/28/2014  . ANEMIA-IRON DEFICIENCY 05/04/2010  . HYPERTENSION 05/04/2010  . 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 ROBOT ASSISTED MYOMECTOMY  05/2010   [facility]    Prior to Admission medications   Medication Sig Start Date End Date Taking? Authorizing Provider  hydrochlorothiazide (HYDRODIURIL) 25 MG tablet Take 25 mg by mouth daily.    Historical Provider, MD  naproxen sodium (ANAPROX) 220 MG tablet Take 440 mg by mouth every 8 (eight) hours as needed (menstrual cramps).    Historical Provider, MD  Prenatal Vit-Fe Fumarate-FA (PRENATAL MULTIVITAMIN) TABS tablet Take 1 tablet by mouth daily at 12 noon.    Historical Provider, MD    valACYclovir (VALTREX) 500 MG tablet Take 500 mg by mouth 2 (two) times daily as needed (flare-ups).    Historical Provider, MD    Allergies Latex  No family history on file.  Social History Social History  Substance Use Topics  . Smoking status: Never Smoker  . Smokeless tobacco: Never Used  . Alcohol use No    Review of Systems  Constitutional: No fever/chills. Eyes: No visual changes. ENT: No sore throat. Cardiovascular: Denies chest pain. Respiratory: Denies shortness of breath. Gastrointestinal: No abdominal pain.  No nausea, no vomiting.  No diarrhea.  No constipation. Genitourinary: Positive for vaginal bleeding. Negative for dysuria. Musculoskeletal: Negative for back pain. Skin: Negative for rash. Neurological: Negative for headaches, focal weakness or numbness.  10-point ROS otherwise negative.  ____________________________________________   PHYSICAL EXAM:  VITAL SIGNS: ED Triage Vitals  Enc Vitals Group     BP 10/03/15 0453 (!) 144/95     Pulse Rate 10/03/15 0453 86     Resp 10/03/15 0453 18     Temp 10/03/15 0453 97.6 F (36.4 C)     Temp Source 10/03/15 0453 Oral     SpO2 10/03/15 0453 100 %     Weight 10/03/15 0451 210 lb (95.3 kg)     Height 10/03/15 0451 5\' 2"  (1.575 m)     Head Circumference --      Peak Flow --      Pain Score --      Pain Loc --      Pain Edu? --      Excl. in [place]? --  Constitutional: Alert and oriented. Well appearing and in no acute distress. Eyes: Conjunctivae are normal. PERRL. EOMI. Head: Atraumatic. Nose: No congestion/rhinnorhea. Mouth/Throat: Mucous membranes are moist.  Oropharynx non-erythematous. Neck: No stridor.   Cardiovascular: Normal rate, regular rhythm. Grossly normal heart sounds.  Good peripheral circulation. Respiratory: Normal respiratory effort.  No retractions. Lungs CTAB. Gastrointestinal: Soft and nontender. No distention. No abdominal bruits. No CVA tenderness. Musculoskeletal: No lower  extremity tenderness nor edema.  No joint effusions. Neurologic:  Normal speech and language. No gross focal neurologic deficits are appreciated. No gait instability. Skin:  Skin is warm, dry and intact. No rash noted. Psychiatric: Mood and affect are normal. Speech and behavior are normal.  ____________________________________________   LABS (all labs ordered are listed, but only abnormal results are displayed)  Labs Reviewed  URINALYSIS COMPLETEWITH MICROSCOPIC ([place]) - Abnormal; Notable for the following:       Result Value   Color, Urine YELLOW (*)    APPearance CLEAR (*)    Hgb urine dipstick 3+ (*)    Protein, ur 30 (*)    Squamous Epithelial / LPF 0-5 (*)    All other components within normal limits  CBC WITH DIFFERENTIAL/PLATELET - Abnormal; Notable for the following:    RBC 2.65 (*)    Hemoglobin 8.1 (*)    HCT 23.6 (*)    RDW 14.6 (*)    All other components within normal limits  POCT PREGNANCY, URINE  POC URINE PREG, ED   ____________________________________________  EKG  None ____________________________________________  RADIOLOGY  Pending ____________________________________________   PROCEDURES  Procedure(s) performed:   Pelvic exam: External exam WNL without rashes, lesions or vesicles. Speculum exam reveals mild vaginal bleeding; cervical os closed. Bimanual exam WNL.  Procedures  Critical Care performed: No  ____________________________________________   INITIAL IMPRESSION / ASSESSMENT AND PLAN / ED COURSE  Pertinent labs & imaging results that were available during my care of the patient were reviewed by me and considered in my medical decision making (see chart for details).  30 year old female with a history of UTI, fibroids and tubo-ovarian abscess who presents with vaginal bleeding. Also requesting that I switch her antibiotic to amoxicillin as she has had better success with this previously. Will check CBC, urinalysis and obtain  pelvic ultrasound.  Clinical Course  Comment By Time  Updated patient's on CBC and urinalysis results. Hemoglobin and hematocrit are slightly higher than they were in 2016. Patient will follow-up with her hematologist as it is likely she will need to restart her iron transfusions. Will add urine culture; do not feel patient would benefit from switching her antibiotics from Septra to amoxicillin at this time. Pending ultrasound results. Care transferred to Dr. Edd Fabian. Anticipate patient will be discharged home to follow-up with her GYN at [facility] as well as her hematologist. Paulette Blanch, MD 07/27 [phone]     ____________________________________________   FINAL CLINICAL IMPRESSION(S) / ED DIAGNOSES  Final diagnoses:  Bleeding  Vaginal bleeding  Anemia, unspecified anemia type      NEW MEDICATIONS STARTED DURING THIS VISIT:  New Prescriptions   No medications on file     Note:  This document was prepared using Dragon voice recognition software and may include unintentional dictation errors.    Paulette Blanch, MD 10/03/15 [place], MD 10/03/15 [id number]

## 2015-10-03 NOTE — Discharge Instructions (Signed)
You were seen in the emergency department for vaginal bleeding. Your ultrasound showed uterine fibroids as well as large mass associated with your right ovary though your ovary itself appeared normal. You decided not to have a CT scan of the abdomen and pelvis and to instead follow up with your OB/GYN doctor. Please call her to schedule an appointment to be seen as soon as possible. Return immediately to the emergency department if you develop any pain, vomiting, diarrhea, fevers, chills, heavy vaginal bleeding, or for any other concerns. Continue taking your current antibiotic as described.

## 2015-10-03 NOTE — ED Triage Notes (Signed)
Patient ambulatory to triage with steady gait, without difficulty or distress noted; pt reports vag bleeding since Monday; denies any accomp symptoms; st seen at Urgent Care on Friday and dx with UTI and rx bactrim but bleeding persists

## 2015-10-03 NOTE — ED Notes (Signed)
Pt states she went to [facility] Tuesday for "bright red blood in urine." Pt reports it is not her period. Went to walk in clinic, dx with UTI, give Bactrim. Pt states hx of low iron and ovarian abscess. Pt describes bleeding "not like a heavy flow." and reports clots, states it is bright red. Pt states bleeding is not consistent. Denies abdominal pain and fevers. LMP ending this Monday, started previous Tuesday. Appears anxious about what's going on.

## 2015-10-03 NOTE — ED Provider Notes (Signed)
-----------------------------------------   8:11 AM on 10/03/2015 -----------------------------------------  I assumed care of this patient from Dr. Beather Arbour at 7:30 AM pending results of the patient's transvaginal ultrasound. Ultrasound shows a large right adnexal lesion which is increased in size when compared to the prior ultrasound and CT. Normal right ovary. Left ovary is not visualized. Fibroid uterus. The recommendation is for a CT of the abdomen and pelvis with contrast for further characterization of the right adnexal lesion. I discussed this with the patient however she has refused CT scan. She reports that it is her preference to follow up with her OB/GYN in the next few days. She reports that she is being closely followed for this right adnexal mass which her OB/GYN thinks is probably related to hydro/pyosalpinx and has already recommended removal. I discussed with the patient that this could represent an abscess in which case she would require admission for drainage and IV antibiotics. We discussed that abscess could make her very ill. She reports that she feels well, has had several CT scans and does not want another CT scan here in the emergency department. We discussed meticulous return precautions and she will follow-up with her OB/GYN doctor this week. DC home.  TVUS IMPRESSION: Large heterogeneous right adnexal lesion appears increased in size compared to prior ultrasound and CT. Differential considerations include abscess, degenerating fibroid, endometrioma or neoplastic process. CT abdomen and pelvis with contrast could be used for further evaluation. The left ovary is not visualized.  The right ovary appears normal. Fibroid uterus. Electronically Signed   Joanne Gavel, MD 10/03/15 [phone]

## 2015-10-03 NOTE — ED Notes (Signed)
Pt was told that this RN would like to start an IV, pt refused, stated "I ain't staying here so I don't need one." Butterfly stick for blood instead.

## 2015-10-18 ENCOUNTER — Ambulatory Visit: Payer: Self-pay | Admitting: Urology

## 2016-04-03 DIAGNOSIS — L709 Acne, unspecified: Secondary | ICD-10-CM | POA: Insufficient documentation

## 2016-04-03 DIAGNOSIS — E669 Obesity, unspecified: Secondary | ICD-10-CM | POA: Insufficient documentation

## 2016-05-01 DIAGNOSIS — B009 Herpesviral infection, unspecified: Secondary | ICD-10-CM | POA: Insufficient documentation

## 2016-05-05 DIAGNOSIS — N760 Acute vaginitis: Secondary | ICD-10-CM | POA: Insufficient documentation

## 2016-05-05 DIAGNOSIS — B9689 Other specified bacterial agents as the cause of diseases classified elsewhere: Secondary | ICD-10-CM | POA: Insufficient documentation

## 2016-11-20 IMAGING — US US ABDOMEN LIMITED
1 series · 13 of 25 positions shown · non-contrast
Comparison: CT 11/28/2014

CLINICAL DATA: Fibroids.  History of endometriomas.

EXAM:
TRANSABDOMINAL AND TRANSVAGINAL ULTRASOUND OF PELVIS, LIMITED
ULTRASOUND THE ABDOMEN.
TECHNIQUE: Both transabdominal and transvaginal ultrasound examinations of the
pelvis were performed. Transabdominal technique was performed for
global imaging of the pelvis including uterus, ovaries, adnexal
regions, and pelvic cul-de-sac. It was necessary to proceed with
endovaginal exam following the transabdominal exam to visualize the
uterus and ovaries.

[Series 1: us abdomen limited · 0.23mm/px · 34 acquisitions, 13 frames shown]
[im 1/34]
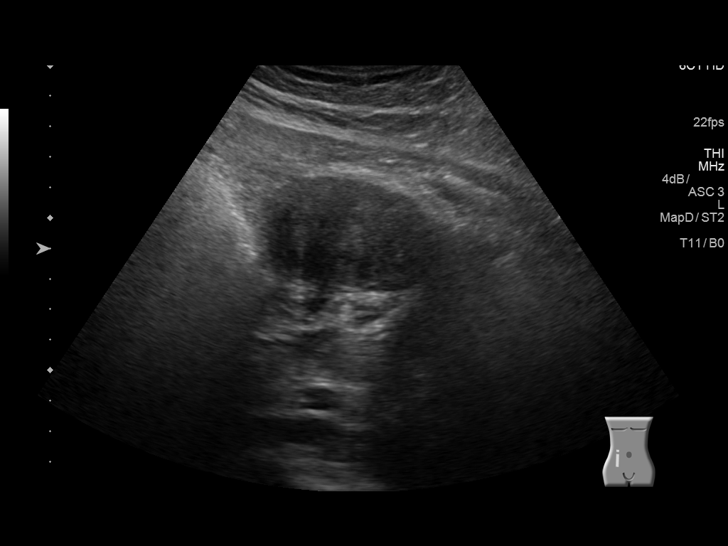
[im 3/34]
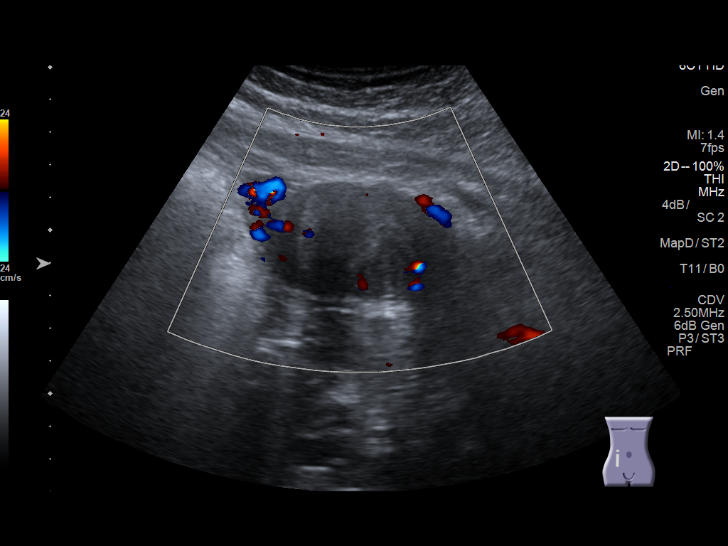
[im 6/34]
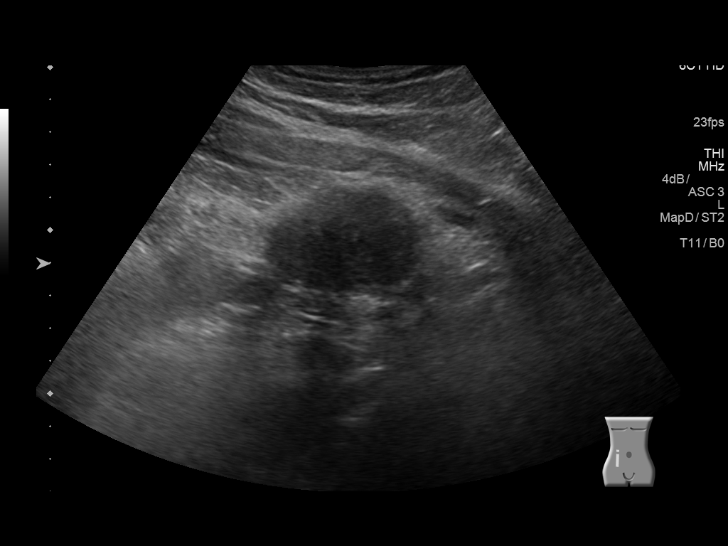
[im 9/34]
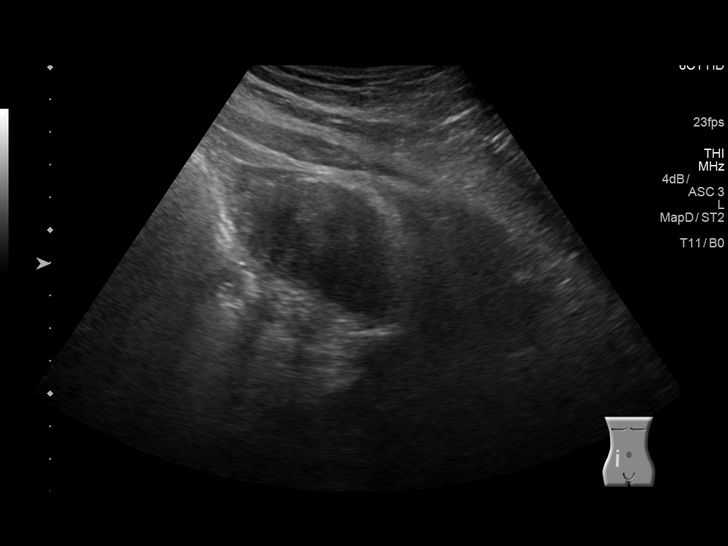
[im 12/34]
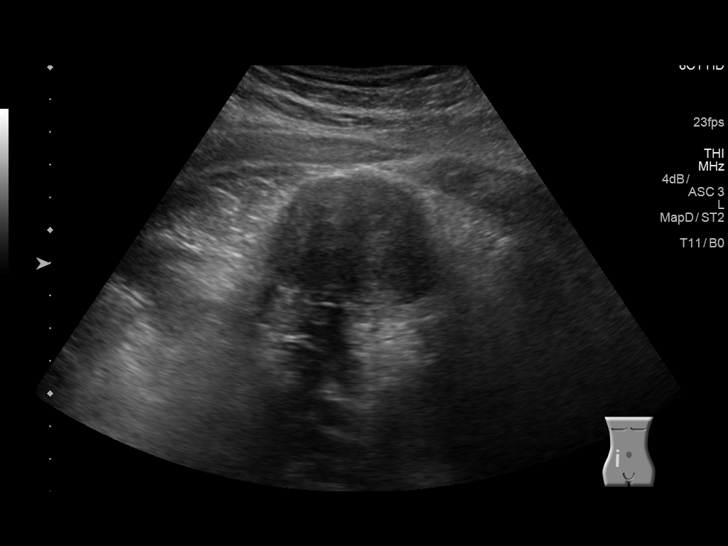
[im 14/34]
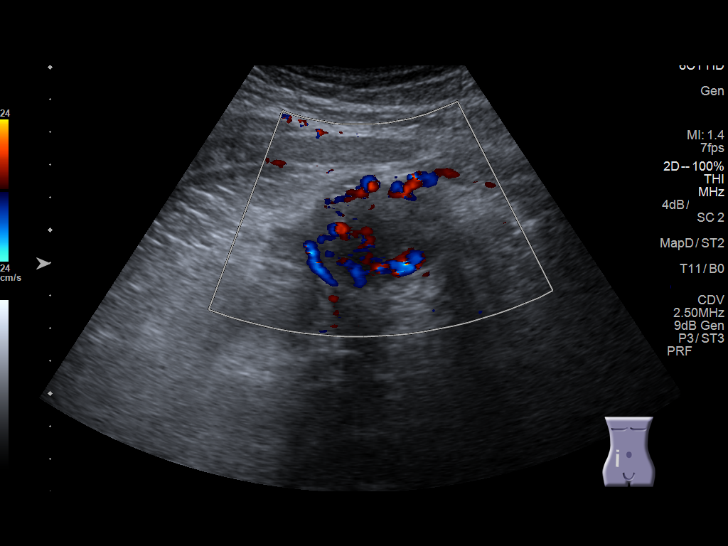
[im 17/34]
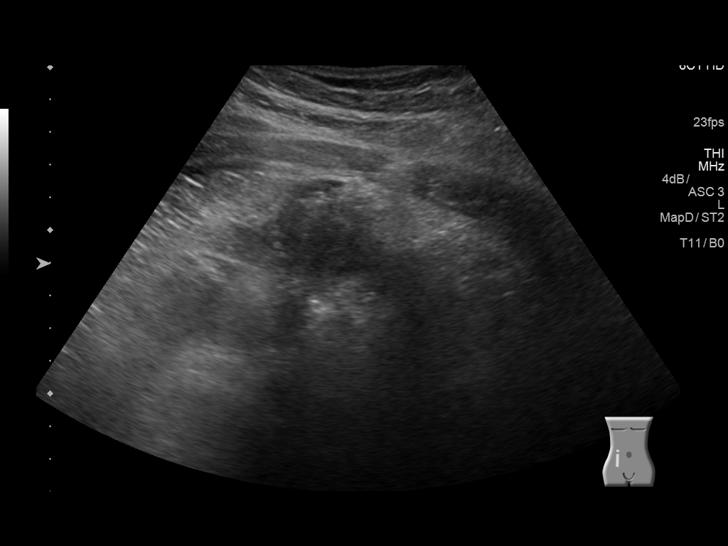
[im 20/34]
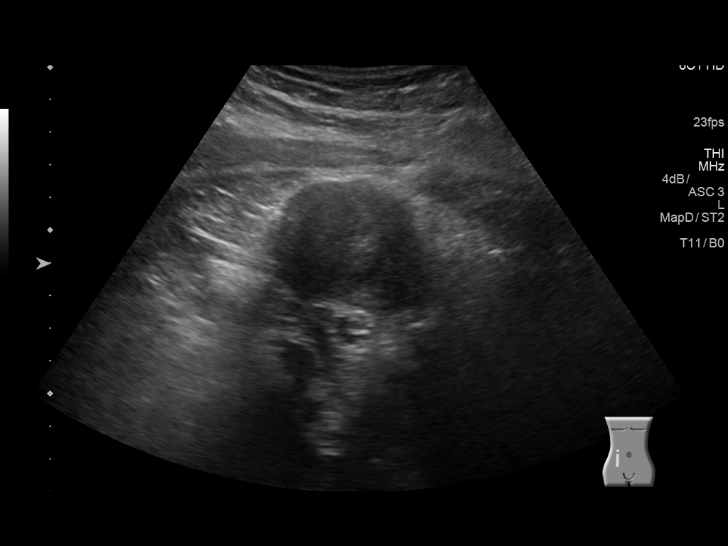
[im 23/34]
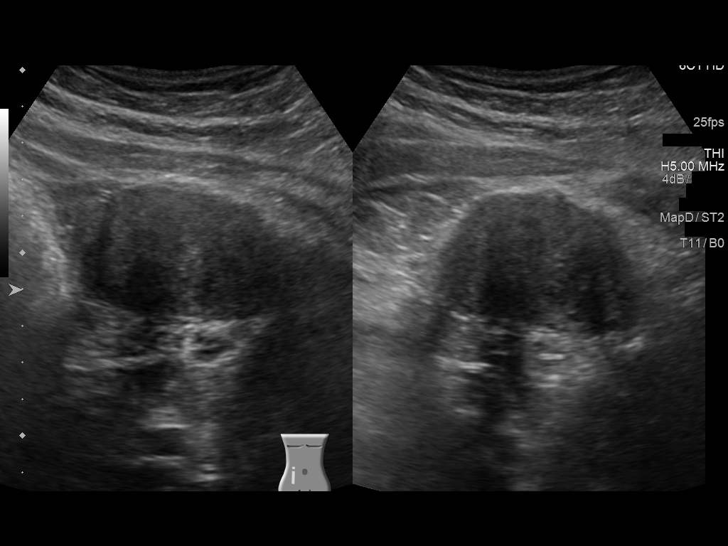
[im 25/34]
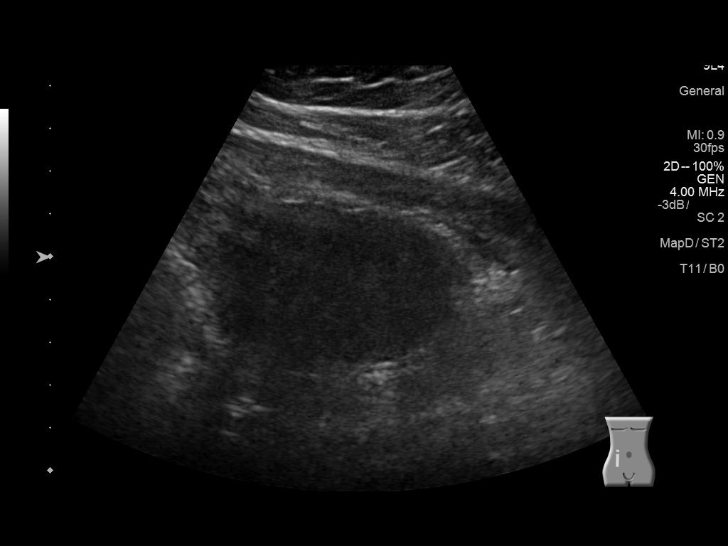
[im 28/34]
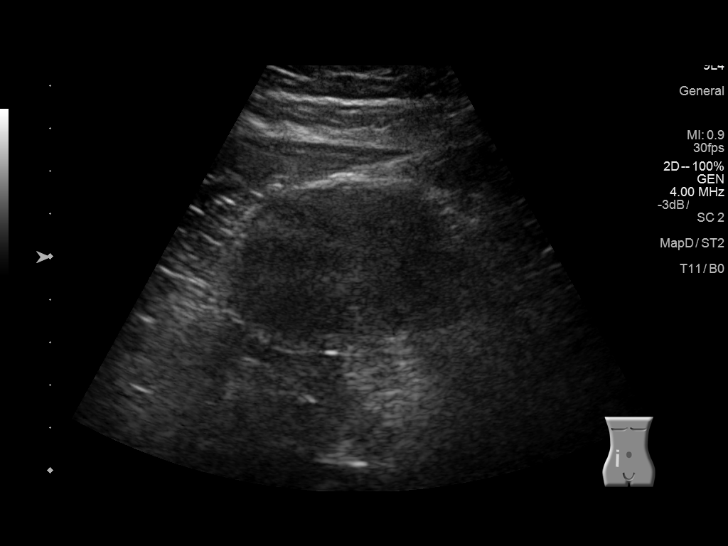
[im 31/34]
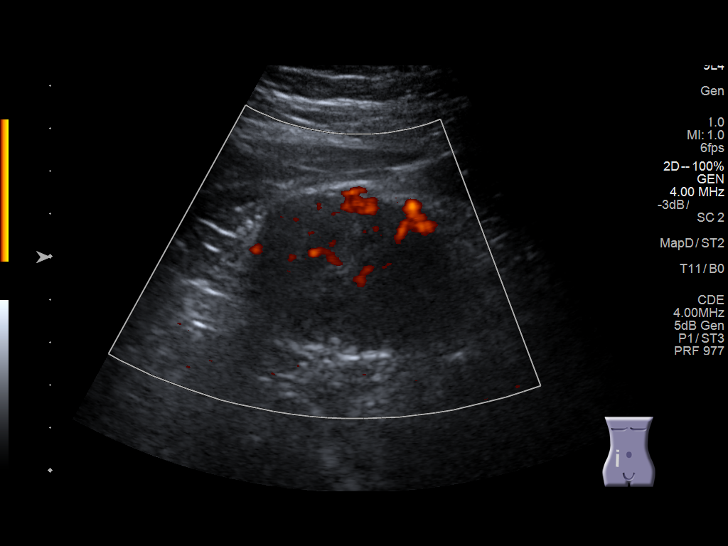
[im 34/34]
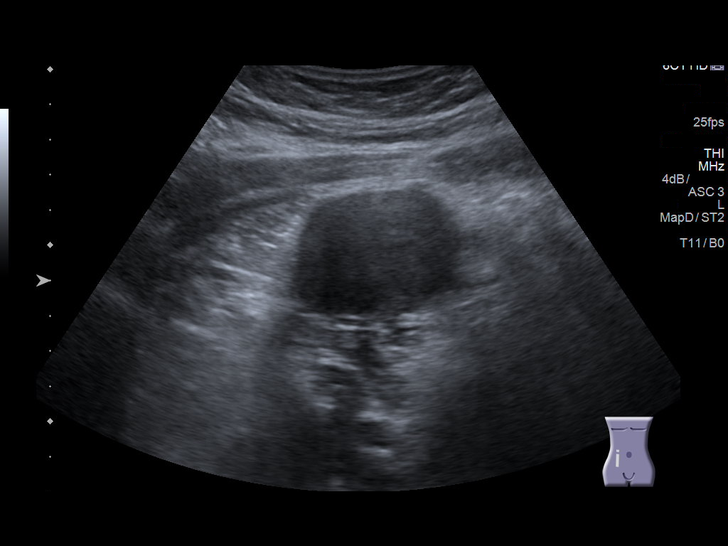

[13 of 25 positions shown; findings below may reference images not displayed]

FINDINGS: Uterus

Measurements: 11.6 x 5.1 x 10.3 cm. A 5.0 cm fibroid noted in the
fundus, a 5.0 cm fibroid noted in the left body. A 9.1 x 6.3 x
cm

Endometrium

Thickness: 9 mm.  No focal abnormality visualized.

Right ovary

Not visualized. There is a complex mass in the right adnexa
measuring approximately 9 x 7 cm. This to be consistent with prior
CT finding and could represent a process such as tubo-ovarian
abscess, endometrioma, or other ovarian mass lesion. If patient is
pregnant ectopic pregnancy cannot be excluded.

Left ovary

Measurements: 4.9 x 4.3 x 4.4 cm. Normal appearance/no adnexal mass.

Other findings

What appears to be a loculated 3.7 x 3.9 cm fluid collection is
noted in the right adnexal region. This could represent a abscess.

Limited ultrasound abdomen reveals a hypoechoic solid-appearing
x 5.4 x 5.7 cm mass in the right subhepatic region corresponding to
CT abnormality.
IMPRESSION: 1. Fibroid uterus.
2. Large right adnexal mass measuring approximately 9 x 7 cm. This
could represent a tubo-ovarian abscess, endometrioma, or other
ovarian mass lesions and correlates with CT findings.
3. Loculated 3.9 cm fluid collection in the right adnexal region.
This could represent an abscess.
4. Limited ultrasound of the abdomen reveals a 3.6 x 5.4 x 5.7 cm
hypoechoic solid-appearing mass in the subhepatic region
corresponding to prior CT abnormality.

## 2016-11-20 IMAGING — US US PELVIS COMPLETE
1 series · 13 of 25 positions shown · non-contrast
Comparison: CT 11/28/2014

CLINICAL DATA: Fibroids.  History of endometriomas.

EXAM:
TRANSABDOMINAL AND TRANSVAGINAL ULTRASOUND OF PELVIS, LIMITED
ULTRASOUND THE ABDOMEN.
TECHNIQUE: Both transabdominal and transvaginal ultrasound examinations of the
pelvis were performed. Transabdominal technique was performed for
global imaging of the pelvis including uterus, ovaries, adnexal
regions, and pelvic cul-de-sac. It was necessary to proceed with
endovaginal exam following the transabdominal exam to visualize the
uterus and ovaries.

[Series 1: us pelvis complete · 0.25mm/px · 13 of 196 slices shown]
[im 1/196]
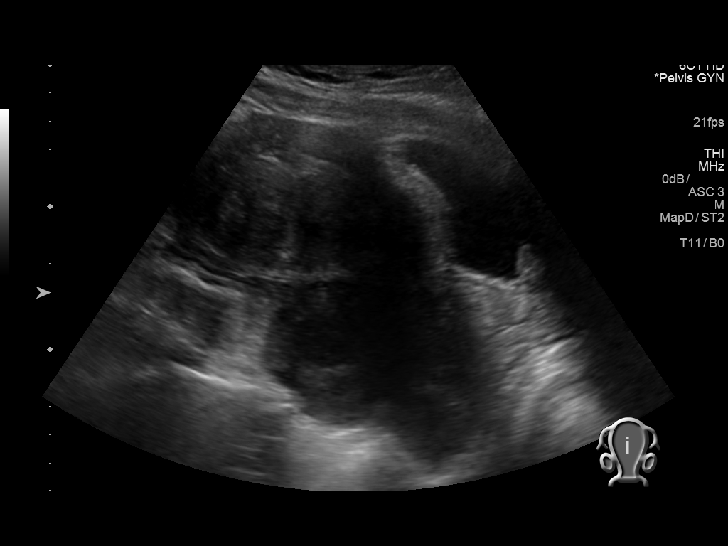
[im 17/196]
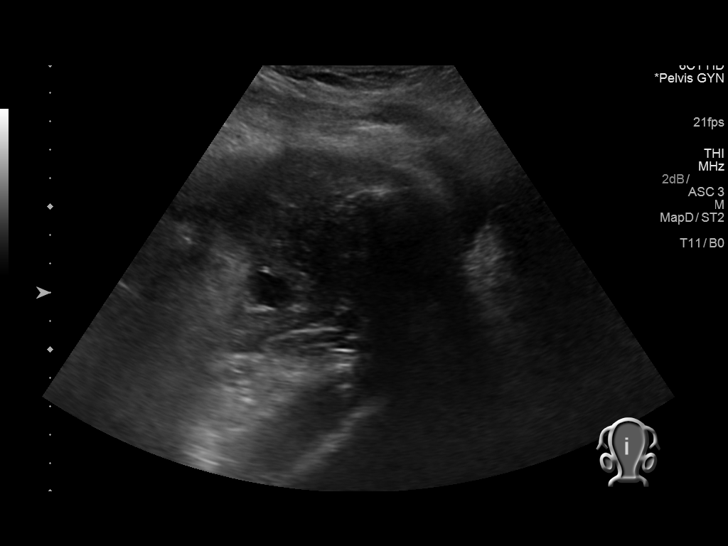
[im 33/196]
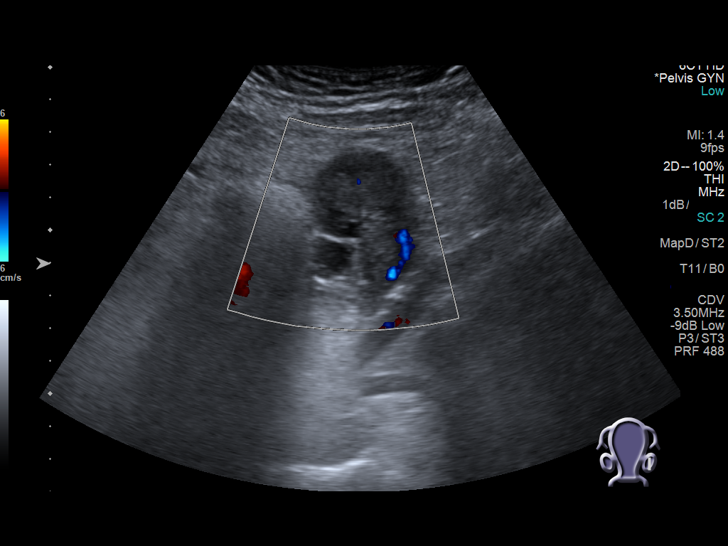
[im 49/196]
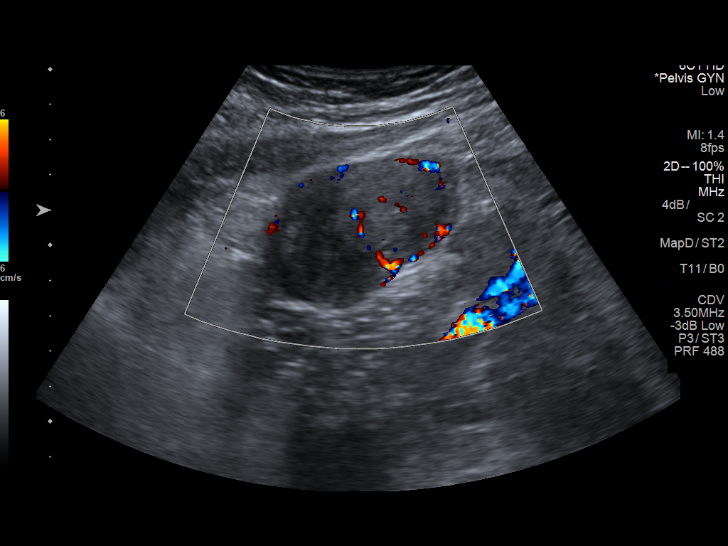
[im 66/196]
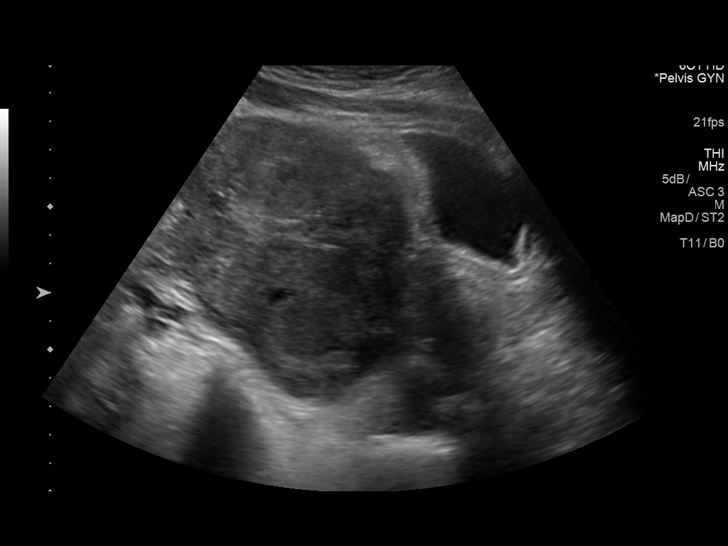
[im 82/196]
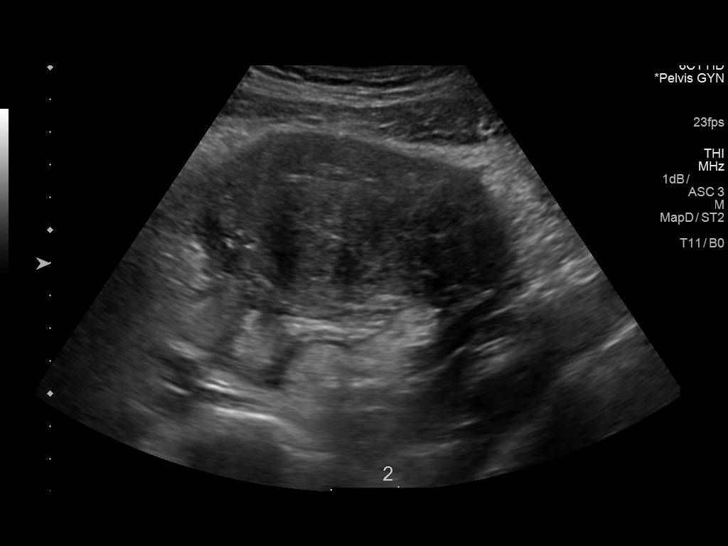
[im 98/196]
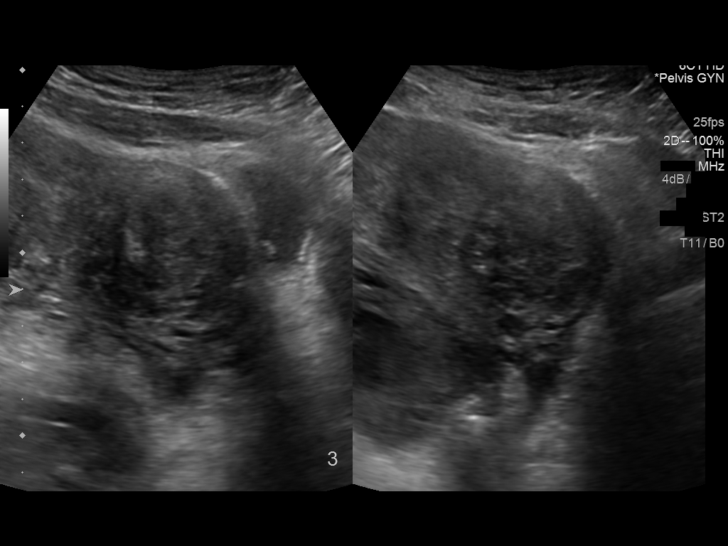
[im 114/196]
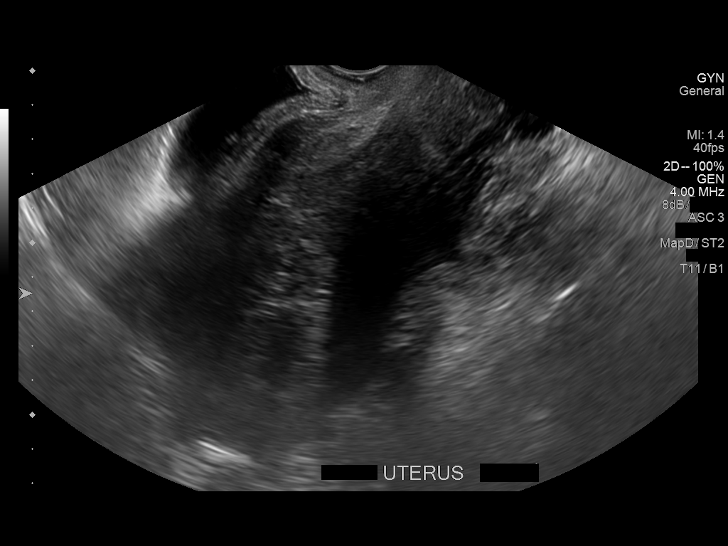
[im 131/196]
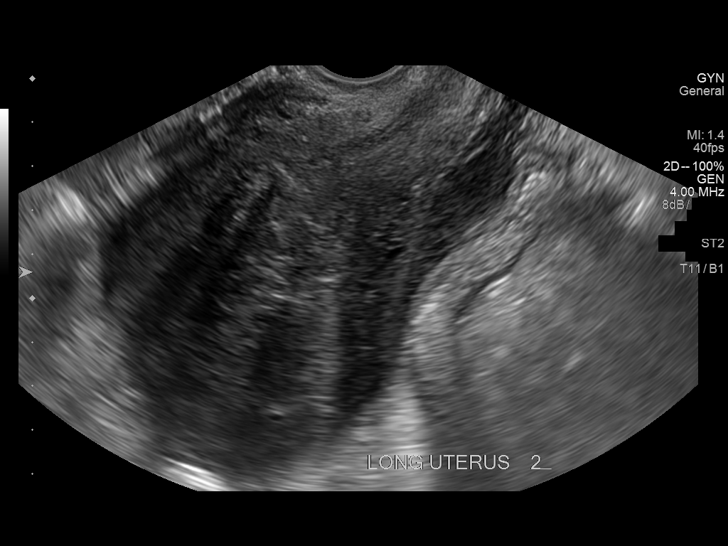
[im 147/196]
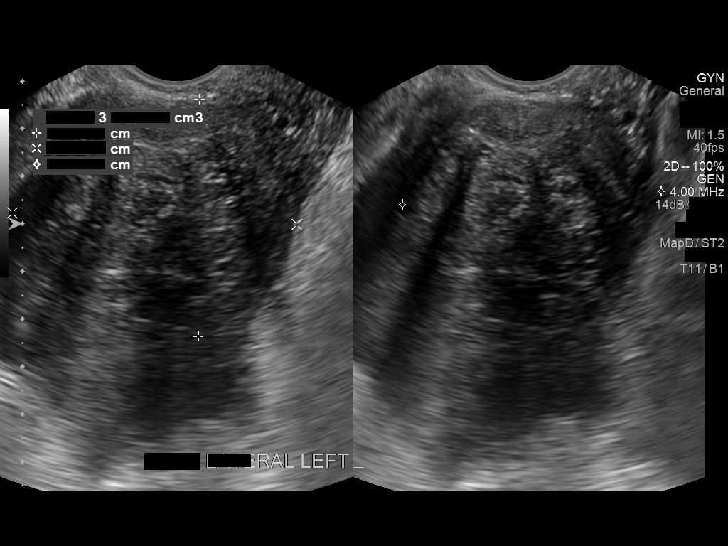
[im 163/196]
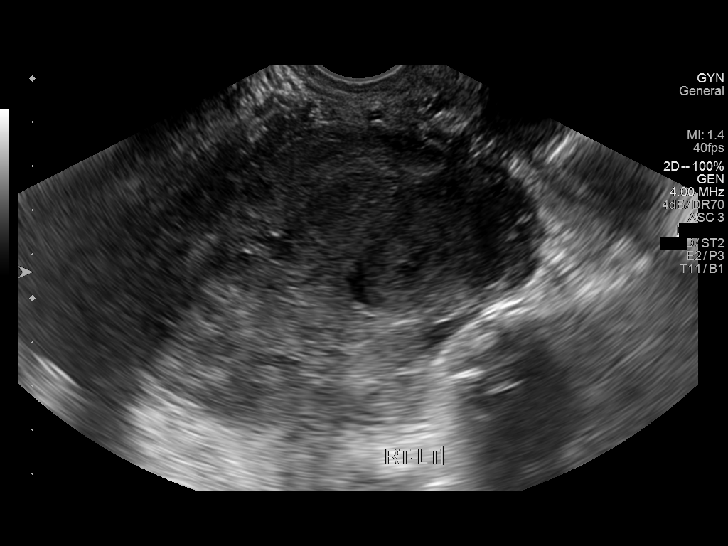
[im 179/196]
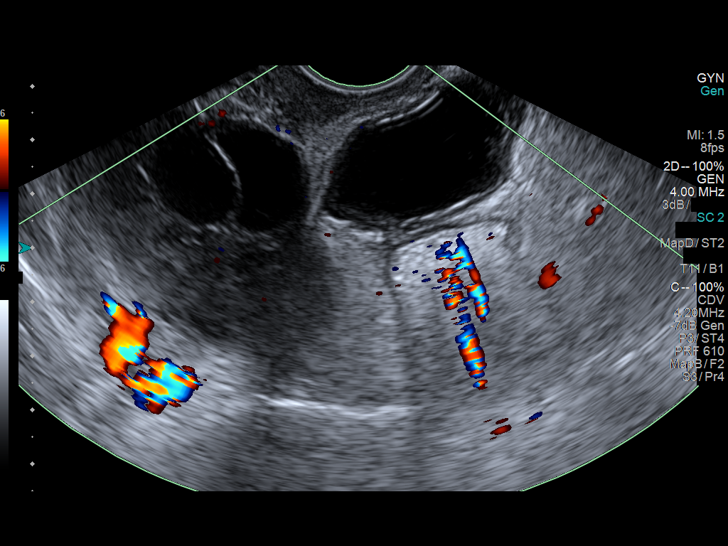
[im 196/196]
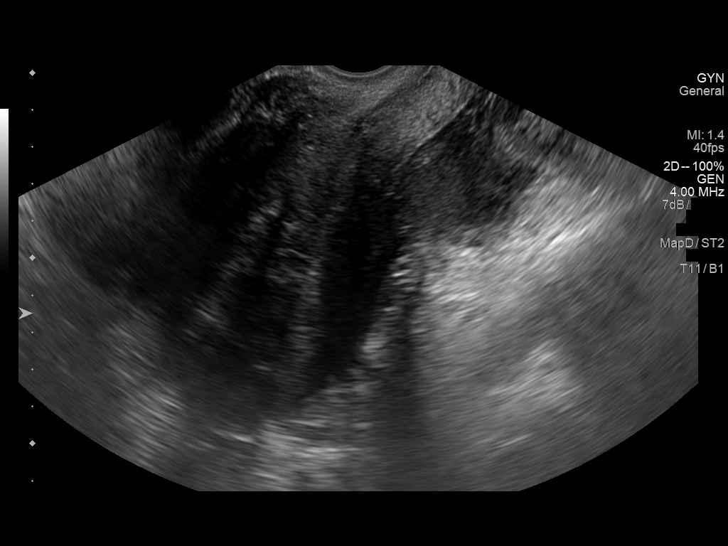

[13 of 25 positions shown; findings below may reference images not displayed]

FINDINGS: Uterus

Measurements: 11.6 x 5.1 x 10.3 cm. A 5.0 cm fibroid noted in the
fundus, a 5.0 cm fibroid noted in the left body. A 9.1 x 6.3 x
cm

Endometrium

Thickness: 9 mm.  No focal abnormality visualized.

Right ovary

Not visualized. There is a complex mass in the right adnexa
measuring approximately 9 x 7 cm. This to be consistent with prior
CT finding and could represent a process such as tubo-ovarian
abscess, endometrioma, or other ovarian mass lesion. If patient is
pregnant ectopic pregnancy cannot be excluded.

Left ovary

Measurements: 4.9 x 4.3 x 4.4 cm. Normal appearance/no adnexal mass.

Other findings

What appears to be a loculated 3.7 x 3.9 cm fluid collection is
noted in the right adnexal region. This could represent a abscess.

Limited ultrasound abdomen reveals a hypoechoic solid-appearing
x 5.4 x 5.7 cm mass in the right subhepatic region corresponding to
CT abnormality.
IMPRESSION: 1. Fibroid uterus.
2. Large right adnexal mass measuring approximately 9 x 7 cm. This
could represent a tubo-ovarian abscess, endometrioma, or other
ovarian mass lesions and correlates with CT findings.
3. Loculated 3.9 cm fluid collection in the right adnexal region.
This could represent an abscess.
4. Limited ultrasound of the abdomen reveals a 3.6 x 5.4 x 5.7 cm
hypoechoic solid-appearing mass in the subhepatic region
corresponding to prior CT abnormality.

## 2016-12-22 DIAGNOSIS — Z789 Other specified health status: Secondary | ICD-10-CM | POA: Insufficient documentation

## 2017-02-22 DIAGNOSIS — R9431 Abnormal electrocardiogram [ECG] [EKG]: Secondary | ICD-10-CM | POA: Insufficient documentation

## 2017-03-05 DIAGNOSIS — I422 Other hypertrophic cardiomyopathy: Secondary | ICD-10-CM | POA: Insufficient documentation

## 2017-03-15 DIAGNOSIS — G4733 Obstructive sleep apnea (adult) (pediatric): Secondary | ICD-10-CM | POA: Insufficient documentation

## 2017-06-11 ENCOUNTER — Ambulatory Visit: Payer: Self-pay | Admitting: Obstetrics & Gynecology

## 2017-08-05 ENCOUNTER — Encounter: Payer: Self-pay | Admitting: Obstetrics and Gynecology

## 2017-08-05 ENCOUNTER — Ambulatory Visit (INDEPENDENT_AMBULATORY_CARE_PROVIDER_SITE_OTHER): Payer: BLUE CROSS/BLUE SHIELD | Admitting: Obstetrics and Gynecology

## 2017-08-05 VITALS — BP 110/74 | HR 74 | Ht 62.0 in | Wt 193.7 lb

## 2017-08-05 DIAGNOSIS — D500 Iron deficiency anemia secondary to blood loss (chronic): Secondary | ICD-10-CM | POA: Diagnosis not present

## 2017-08-05 DIAGNOSIS — N920 Excessive and frequent menstruation with regular cycle: Secondary | ICD-10-CM | POA: Diagnosis not present

## 2017-08-05 DIAGNOSIS — Z87898 Personal history of other specified conditions: Secondary | ICD-10-CM | POA: Diagnosis not present

## 2017-08-05 DIAGNOSIS — Z8742 Personal history of other diseases of the female genital tract: Secondary | ICD-10-CM

## 2017-08-05 DIAGNOSIS — D259 Leiomyoma of uterus, unspecified: Secondary | ICD-10-CM

## 2017-08-05 DIAGNOSIS — N809 Endometriosis, unspecified: Secondary | ICD-10-CM | POA: Diagnosis not present

## 2017-08-05 NOTE — Progress Notes (Signed)
Pt stated that her cycles are irregular and last several days.

## 2017-08-05 NOTE — Progress Notes (Signed)
GYNECOLOGY CLINIC PROGRESS NOTE Subjective:    Lynn Boyd is a 32 y.o. G0P0000 female who presents to establish care, and to discuss management of her uterine fibroids and endometriosis. She is transitioning care from [facility]. Patient reports that she was diagnosed with uterine fibroids in 2012.  She underwent a myomectomy at that time due to size and symptoms, and during her surgery she was dianosed with endometriosis (but notes she was told it was a "small amount present".  Periods are regular every 28-30 days, lasting 5-6 days. Dysmenorrhea:mild, occurring first 1-2 days of flow. Days 2-4` are the heaviest.  She wears regular pads during the day but requires a super pad at night. She does note an additional 4-5 days of spotting after her cycle has completed.  No intermenstrual bleeding, spotting, or discharge.  Patient notes that she would like to discuss her options.  She has had a myomectomy in the past.  Would like to preserve fertility so does not desire definitive management, however desires to wait until closer to the time that may desire to conceive as she has had the procedure before and the fibroids have grown back.  Currently is not in a relationship or actively attempting to conceive through other methodologies. She has a h/o having blocked fallopian tubes, so she knows she will need this corrected as well. She reports that she has used OCPs and Lysteda in the past to manage her cycles but this made them worse.  She notes she was informed of UFE, however was also advised that this could affect her fertility so she decided not to pursue it. She has been receiving iron infusions for the past year for her anemia. She reports that exercise and decreasing soda intake have been the measures that have been most helpful in managing her symptoms.   Gynecologic History:  Patient's last menstrual period was 07/23/2017.  Menarche: age 6 Current contraception: abstinence History of  abnormal Pap smear: yes - 2 years ago, was diagnosed with HPV (but was told it was the non-high risk type). Pap smear last year was normal.  Mammogram: N/A.  Patient not yet of age for screening.    OB History  Gravida Para Term Preterm AB Living  0 0 0 0 0 0  SAB TAB Ectopic Multiple Live Births  0 0 0 0 0     Past Medical History:  Diagnosis Date  . Chronic blood loss anemia   . Endometriosis   . Enlarged heart   . Fibroid uterus   . Heart murmur   . Herpes simplex virus (HSV) infection   . History of blood transfusion 2016   2 units, given for chronic blood loss anemia   . Hypertension   . TOA (tubo-ovarian abscess) 11/28/2014  . Vaginal Pap smear, abnormal 2017   ASCUS with HPV+    Family History  Problem Relation Age of Onset  . Hypertension Mother   . Hypertension Father     Past Surgical History:  Procedure Laterality Date  . endometreoisis     surgery  . IR IMAGE GUIDED DRAINAGE BY PERCUTANEOUS CATHETER  2016   left tubo-ovarian abscess, performed at [facility]  05/2010   [facility]    Social History   Socioeconomic History  . Marital status: Single    Spouse name: Not on file  . Number of children: Not on file  . Years of education: Not on file  . Highest education  level: Not on file  Occupational History  . Occupation: Training and development officer: [employer]  . Financial resource strain: Not on file  . Food insecurity:    Worry: Not on file    Inability: Not on file  . Transportation needs:    Medical: Not on file    Non-medical: Not on file  Tobacco Use  . Smoking status: Never Smoker  . Smokeless tobacco: Never Used  Substance and Sexual Activity  . Alcohol use: No    Alcohol/week: 0.0 oz  . Drug use: No  . Sexual activity: Yes    Birth control/protection: None  Lifestyle  . Physical activity:    Days per week: Not on file    Minutes per session: Not on file  . Stress: Not on file    Relationships  . Social connections:    Talks on phone: Not on file    Gets together: Not on file    Attends religious service: Not on file    Active member of club or organization: Not on file    Attends meetings of clubs or organizations: Not on file    Relationship status: Not on file  . Intimate partner violence:    Fear of current or ex partner: Not on file    Emotionally abused: Not on file    Physically abused: Not on file    Forced sexual activity: Not on file  Other Topics Concern  . Not on file  Social History Narrative  . Not on file     Current Outpatient Medications on File Prior to Visit  Medication Sig Dispense Refill  . ferrous sulfate 325 (65 FE) MG EC tablet Take 325 mg by mouth 3 (three) times daily with meals.    . metoprolol tartrate (LOPRESSOR) 25 MG tablet Take 25 mg by mouth 2 (two) times daily.    . Naltrexone-buPROPion HCl ER (CONTRAVE) 8-90 MG TB12 Take by mouth.    . naproxen sodium (ANAPROX) 220 MG tablet Take 440 mg by mouth every 8 (eight) hours as needed (menstrual cramps).    Marland Kitchen spironolactone (ALDACTONE) 50 MG tablet Take 50 mg by mouth daily.    . valACYclovir (VALTREX) 500 MG tablet Take 500 mg by mouth 2 (two) times daily as needed (flare-ups).    . hydrochlorothiazide (HYDRODIURIL) 25 MG tablet Take 25 mg by mouth daily.    . Prenatal Vit-Fe Fumarate-FA (PRENATAL MULTIVITAMIN) TABS tablet Take 1 tablet by mouth daily at 12 noon.     No current facility-administered medications on file prior to visit.     Allergies  Allergen Reactions  . Latex      Review of Systems Pertinent items noted in HPI and remainder of comprehensive ROS otherwise negative.    Objective:     BP 110/74   Pulse 74   Ht 5\' 2"  (1.575 m)   Wt 193 lb 11.2 oz (87.9 kg)   LMP 07/23/2017   BMI 35.43 kg/m  General appearance: alert and no distress Neck: no adenopathy, no carotid bruit, no JVD, supple, symmetrical, trachea midline and thyroid not enlarged,  symmetric, no tenderness/mass/nodules Breasts: normal appearance, no masses or tenderness Heart: regular rate and rhythm, S1, S2 normal, no murmur, click, rub or gallop Abdomen: normal findings: bowel sounds normal, no organomegaly and soft, non-tender and abnormal findings:  mass, located in the periumbilical area (arising from the pelvis), irregular contours. Pelvic: external genitalia normal, rectovaginal septum normal.  Vagina  without discharge.  Cervix normal appearing, no lesions and no motion tenderness.  Uterus enlarged, ~ 16-18 week size, mobile, non-tender, irregular contours, posterior mass palpable. Adnexae non-palpable, nontender bilaterally.  Extremities: extremities normal, atraumatic, no cyanosis or edema Neurologic: Grossly normal     Imaging:  CLINICAL DATA:  Vaginal bleeding for 3 days. History of uterine fibroids. EXAM: TRANSABDOMINAL AND TRANSVAGINAL ULTRASOUND OF PELVIS DOPPLER ULTRASOUND OF OVARIES TECHNIQUE: Both transabdominal and transvaginal ultrasound examinations of the pelvis were performed. Transabdominal technique was performed for global imaging of the pelvis including uterus, ovaries, adnexal regions, and pelvic cul-de-sac. It was necessary to proceed with endovaginal exam following the transabdominal exam to visualize the adnexa. Color and duplex Doppler ultrasound was utilized to evaluate blood flow to the ovaries. COMPARISON:  CT abdomen and pelvis 11/28/2014. Pelvic ultrasound 11/30/2014 FINDINGS: Uterus Measurements: 12.0 x 6.5 x 11.3 cm. Three fibroids are identified. The largest off the fundus on the right measuring 7.9 x 5.1 x 7.4 cm. Two other fibroids measure 4.2 x 3.5 x 4.5 cm and 5.3 x 4.6 x 4.4 cm. Endometrium Thickness: 11 mm.  No focal abnormality visualized. Right ovary Not visualized. Left ovary Measurements: 3.6 x 2.9 x 3.0 cm. A heterogeneous right adnexal mass containing fluid and debris measures 10.1 x 5.5 x 6.9 cm. There  are a few small areas of internal flow within this lesion. Pulsed Doppler evaluation of the right ovary demonstrates normal low-resistance arterial and venous waveforms. Other findings No abnormal free fluid. IMPRESSION: Large heterogeneous right adnexal lesion appears increased in size compared to prior ultrasound and CT. Differential considerations include abscess, degenerating fibroid, endometrioma or neoplastic process. CT abdomen and pelvis with contrast could be used for further evaluation. The left ovary is not visualized.  The right ovary appears normal. Fibroid uterus.  Electronically Signed   By: Inge Rise M.D.   On: 10/03/2015 07:32  Assessment:    Symptomatic uterine fibroids.   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 Contraception: abstinence. - Pap smear performed today as she is due for screening and h/o abnormal pap smear. If current pap smear normal, can resume routine q 3 year screening.   -Lengthy discussion had with patient regarding fibroid uterus, menorrhagia with regular cycle, chronic anemia and management options.  Patient has tried OCPs and Lysteda in the past, with minimal improvement in  Results (notes they both made cycles heavier).  Notes currently that exercise and decreasing caffeine intake have improved her symptoms the best, however she is aware that this will not slow the growth of her fibroids.  Patient would like to try a method that was not a contraceptive.  Is not yet ready to proceed with another surgical intervention at this time.  Discussed option of use of Depo-Lupron.  This treatment would help to decrease the size of her fibroids, as well as decrease her need for requiring iron transfusions for anemia.  Discussed that the effects of Lupron may last way beyond  the required treatment.,  And could give her relief for upwards of 1 to 2 years before requiring further treatment.  Patient is okay with current plan.  Given information to review.  Will have patient follow-up in the next several weeks for her first injection.  She will also have a pelvic ultrasound done to reassess the size of her fibroids  at the start of treatment.   - Endometriosis not bothersome, but will also be treated with use of  Lupron for fibroids.    A total of 45 minutes were spent face-to-face with the patient during the encounter with greater than 50% dealing with counseling and coordination of care.   Rubie Maid, MD [facility]

## 2017-08-08 ENCOUNTER — Encounter: Payer: Self-pay | Admitting: Obstetrics and Gynecology

## 2017-08-08 DIAGNOSIS — D259 Leiomyoma of uterus, unspecified: Secondary | ICD-10-CM | POA: Insufficient documentation

## 2017-08-09 ENCOUNTER — Other Ambulatory Visit: Payer: Self-pay

## 2017-08-10 ENCOUNTER — Encounter: Payer: Self-pay | Admitting: Obstetrics and Gynecology

## 2017-08-11 ENCOUNTER — Telehealth: Payer: Self-pay | Admitting: Obstetrics and Gynecology

## 2017-08-11 NOTE — Telephone Encounter (Signed)
Arbie Cookey from [employer]" (spelling?) called at 8:45 this morning asking if the patient's Summary of Benefits were received for BCBS [only]?  No call back number given.  Please advise.  Thank you.

## 2017-08-12 LAB — PAP IG, CT-NG NAA, HPV HIGH-RISK
Chlamydia, Nuc. Acid Amp: NEGATIVE
Gonococcus by Nucleic Acid Amp: NEGATIVE
HPV, high-risk: NEGATIVE
PAP Smear Comment: 0

## 2017-08-20 ENCOUNTER — Telehealth: Payer: Self-pay | Admitting: Obstetrics and Gynecology

## 2017-08-20 NOTE — Telephone Encounter (Signed)
The patient called and stated that she needs to speak with her nurse in regards to her receiving a call to get an injection, and that she has not received one yet. Please advise.

## 2017-08-20 NOTE — Telephone Encounter (Signed)
Called pt back spoke with pt. Pt was a little concerned about taking Lupron and was scared that it would mess with her becoming pregnant in the future. Pt stated that she would think more on the medication before making a discussion but still wanted paperwork completed on medication just in case she changes her mind.

## 2017-08-23 NOTE — Telephone Encounter (Signed)
Received a fax from pharmacy and the paperwork was completed and faxed back to the pharmacy.

## 2017-08-25 ENCOUNTER — Telehealth: Payer: Self-pay | Admitting: Obstetrics and Gynecology

## 2017-08-25 NOTE — Telephone Encounter (Signed)
Pharmacy called and stated if the patient had not received the Lupron already that someone needs to set up a shipment for the patient with [facility].  Contact number is [phone], please advise, thank you.

## 2017-08-26 ENCOUNTER — Other Ambulatory Visit: Payer: Self-pay | Admitting: Obstetrics and Gynecology

## 2017-08-26 DIAGNOSIS — D259 Leiomyoma of uterus, unspecified: Secondary | ICD-10-CM

## 2017-08-26 NOTE — Telephone Encounter (Signed)
Call and spoke with someone but was disconnected while on hold. Will call back tomorrow.

## 2017-08-27 ENCOUNTER — Ambulatory Visit (INDEPENDENT_AMBULATORY_CARE_PROVIDER_SITE_OTHER): Payer: BLUE CROSS/BLUE SHIELD

## 2017-08-27 DIAGNOSIS — D259 Leiomyoma of uterus, unspecified: Secondary | ICD-10-CM | POA: Diagnosis not present

## 2017-08-31 ENCOUNTER — Telehealth: Payer: Self-pay | Admitting: Obstetrics and Gynecology

## 2017-08-31 NOTE — Telephone Encounter (Signed)
The patient called and stated that she is checking on the status of her u/s results. No other information was disclosed. Please advise.

## 2017-08-31 NOTE — Telephone Encounter (Signed)
Pt was called and explained to pt that [facility] needed to review her u/s before anyone could give her results and [facility] was out of the office until Monday. Pt stated that she understood and would wait on a call next week from the office.

## 2017-09-01 NOTE — Telephone Encounter (Signed)
Spoke the company yesterday and the medication was shipped today.

## 2017-09-16 ENCOUNTER — Encounter: Payer: Self-pay | Admitting: Obstetrics and Gynecology

## 2017-09-16 NOTE — Telephone Encounter (Signed)
Pt was called and went over test results. Pt had a few questions for [facility] and those questions were sent to [facility] via email.

## 2017-09-23 IMAGING — US US PELVIS COMPLETE
1 series · 13 of 25 positions shown · non-contrast
Comparison: CT abdomen and pelvis 11/28/2014. Pelvic ultrasound
11/30/2014

CLINICAL DATA: Vaginal bleeding for 3 days. History of uterine
fibroids.

EXAM:
TRANSABDOMINAL AND TRANSVAGINAL ULTRASOUND OF PELVIS
DOPPLER ULTRASOUND OF OVARIES
TECHNIQUE: Both transabdominal and transvaginal ultrasound examinations of the
pelvis were performed. Transabdominal technique was performed for
global imaging of the pelvis including uterus, ovaries, adnexal
regions, and pelvic cul-de-sac.
It was necessary to proceed with endovaginal exam following the
transabdominal exam to visualize the adnexa. Color and duplex
Doppler ultrasound was utilized to evaluate blood flow to the
ovaries.

[Series 1: us pelvis complete · 0.24mm/px · 52 acquisitions, 13 frames shown]
[im 1/52]
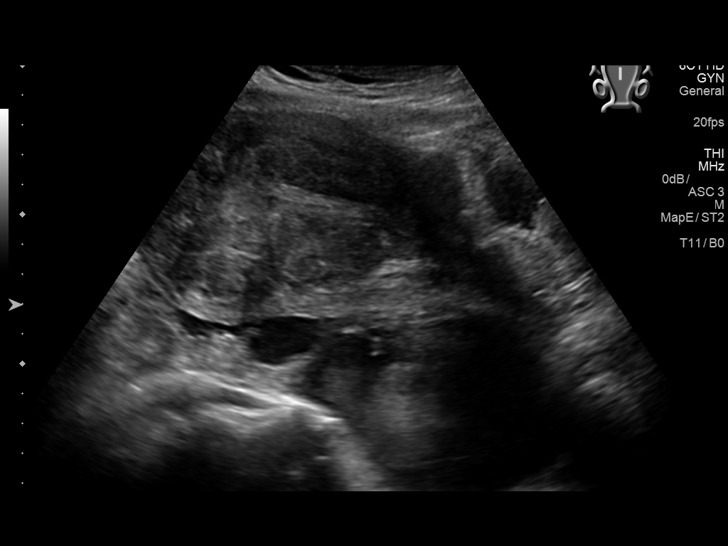
[im 5/52]
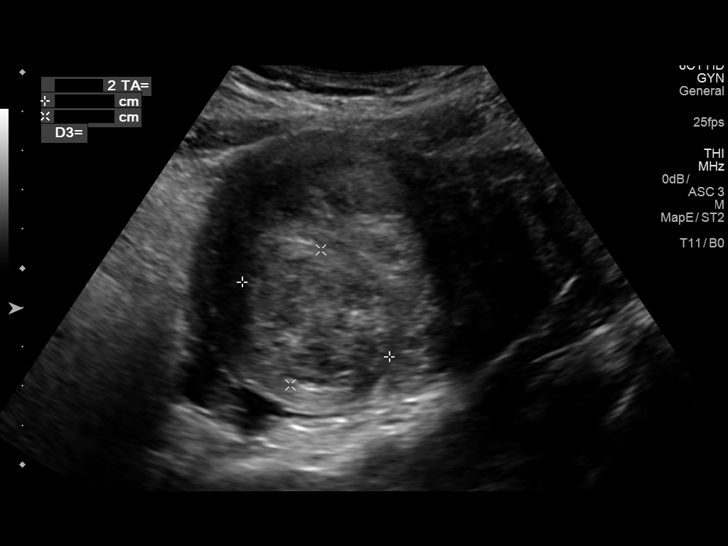
[im 9/52]
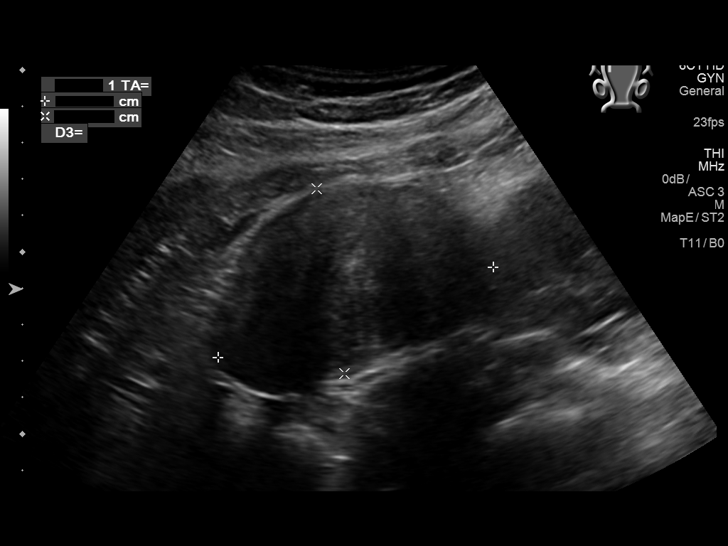
[im 13/52]
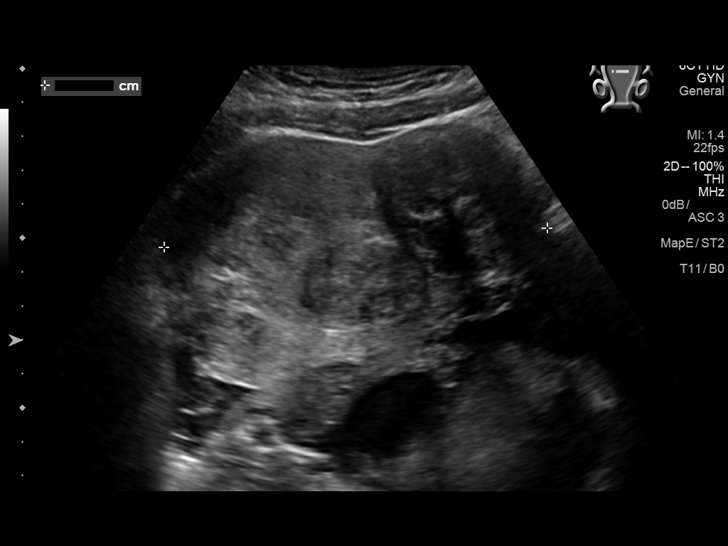
[im 18/52]
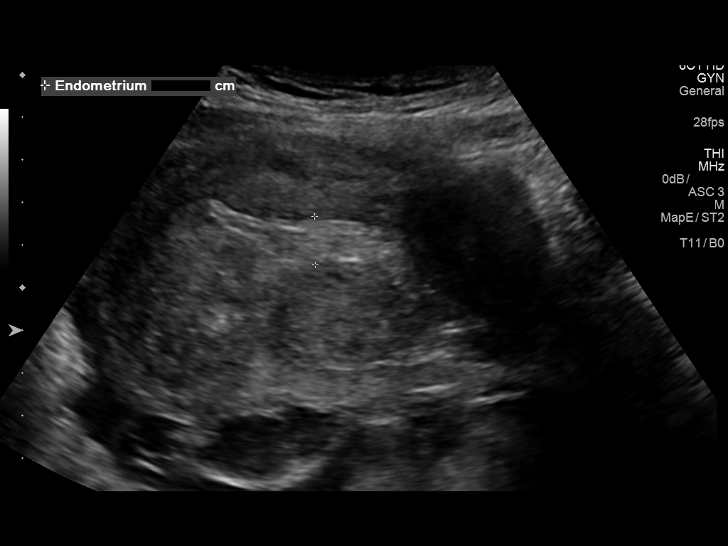
[im 22/52]
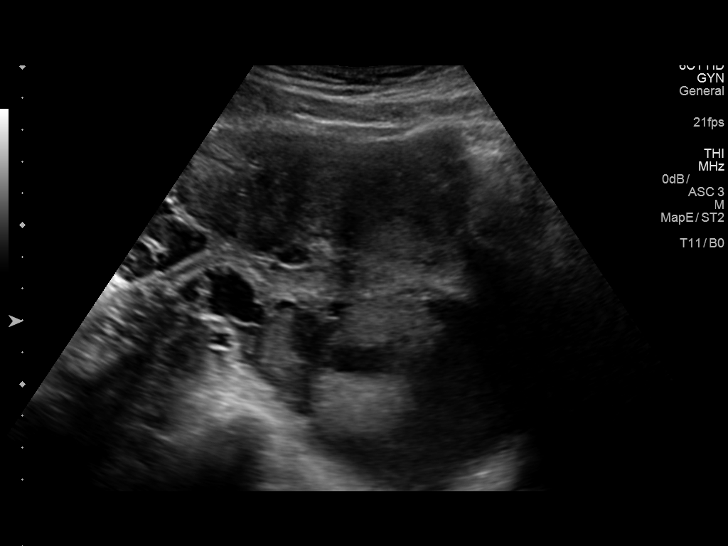
[im 26/52]
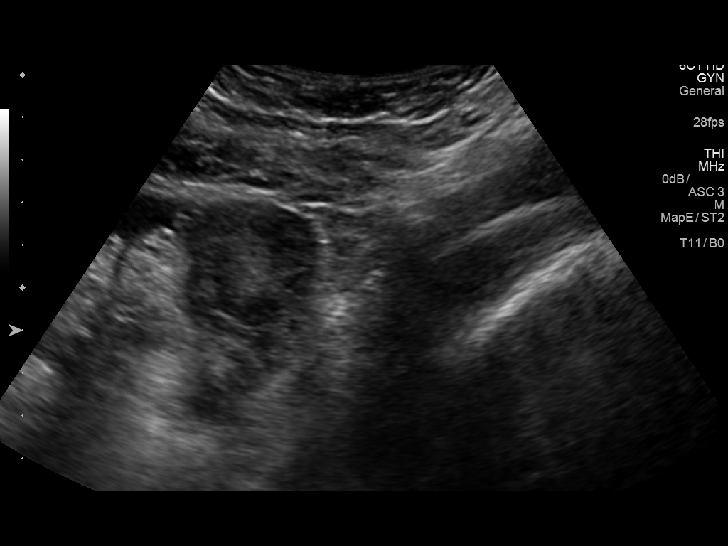
[im 30/52]
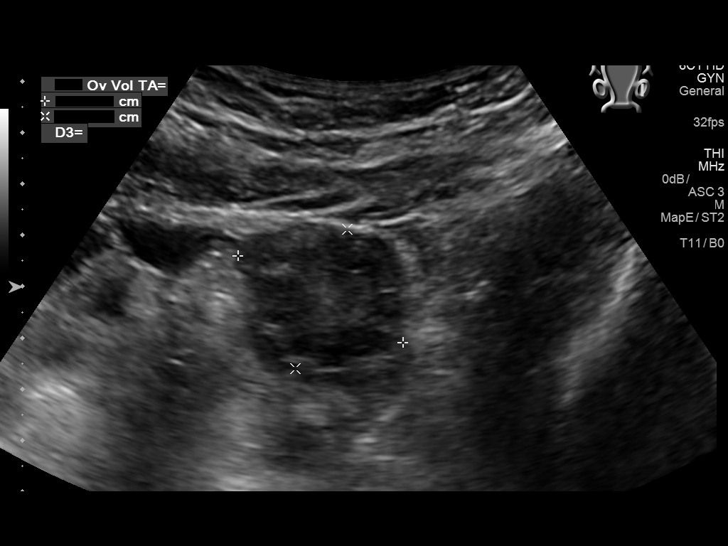
[im 35/52]
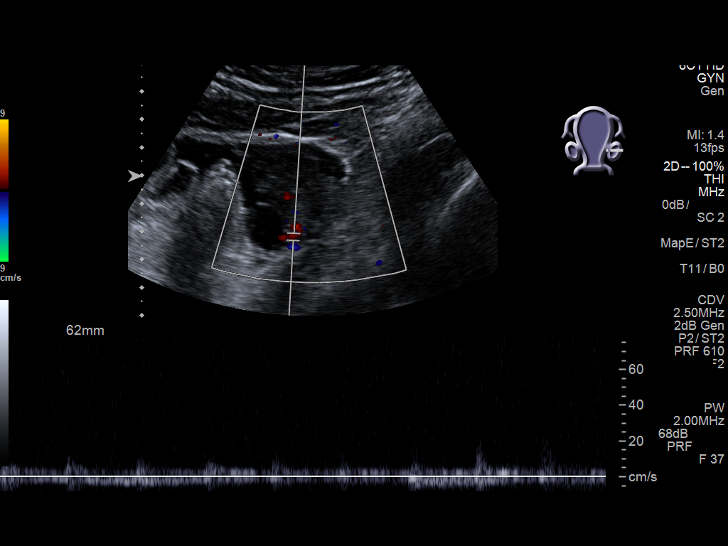
[im 39/52]
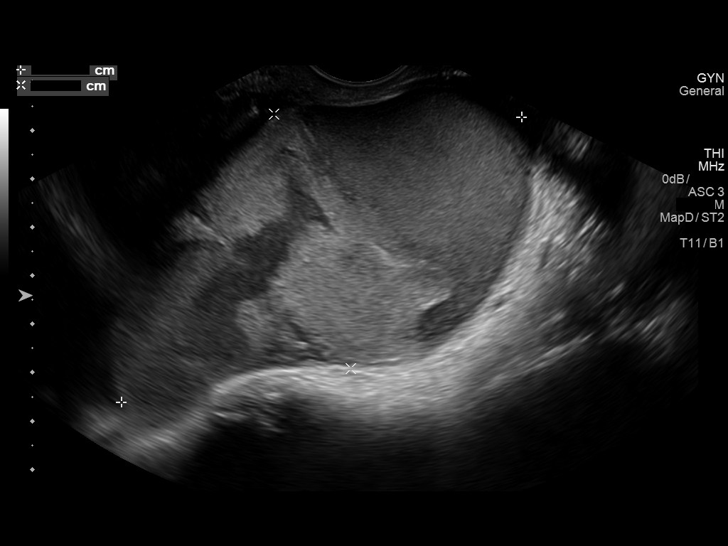
[im 43/52]
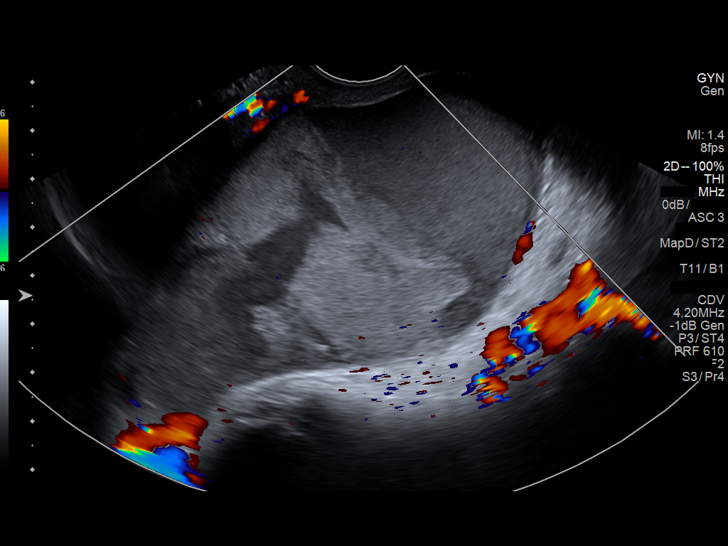
[im 47/52]
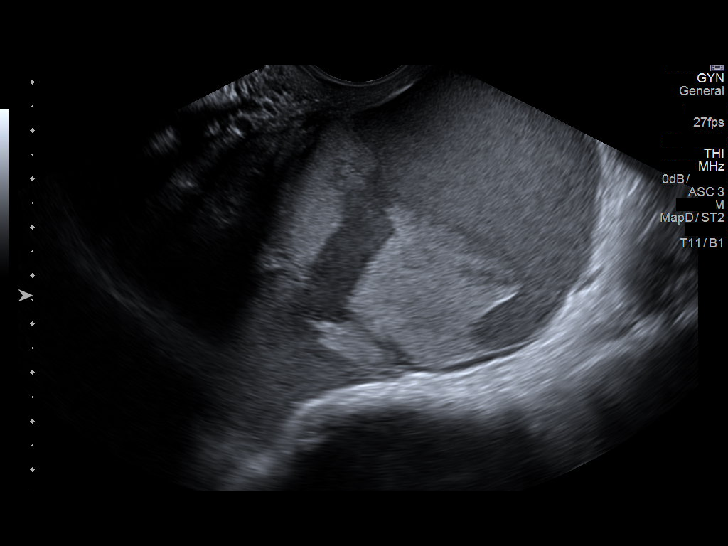
[im 52/52]
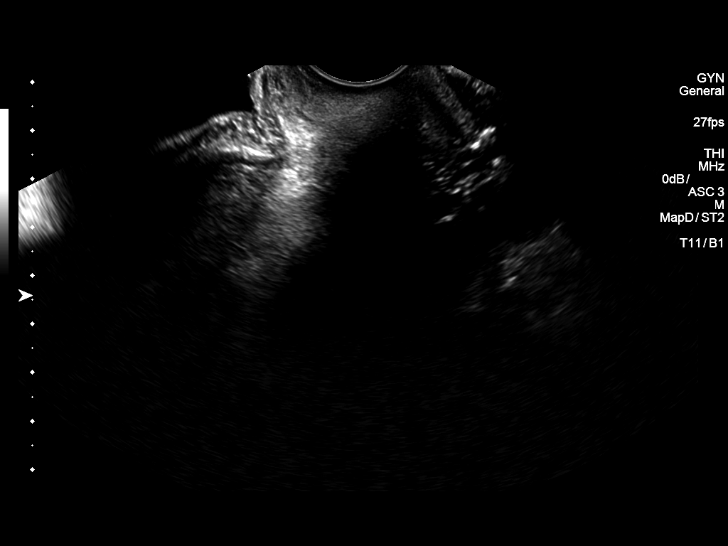

[13 of 25 positions shown; findings below may reference images not displayed]

FINDINGS: Uterus

Measurements: 12.0 x 6.5 x 11.3 cm. Three fibroids are identified.
The largest off the fundus on the right measuring 7.9 x 5.1 x
cm. Two other fibroids measure 4.2 x 3.5 x 4.5 cm and 5.3 x 4.6 x
4.4 cm.

Endometrium

Thickness: 11 mm.  No focal abnormality visualized.

Right ovary

Not visualized.

Left ovary

Measurements: 3.6 x 2.9 x 3.0 cm. A heterogeneous right adnexal mass
containing fluid and debris measures 10.1 x 5.5 x 6.9 cm. There are
a few small areas of internal flow within this lesion.

Pulsed Doppler evaluation of the right ovary demonstrates normal
low-resistance arterial and venous waveforms.

Other findings

No abnormal free fluid.
IMPRESSION: Large heterogeneous right adnexal lesion appears increased in size
compared to prior ultrasound and CT. Differential considerations
include abscess, degenerating fibroid, endometrioma or neoplastic
process. CT abdomen and pelvis with contrast could be used for
further evaluation.

The left ovary is not visualized.  The right ovary appears normal.

Fibroid uterus.

## 2017-10-22 ENCOUNTER — Encounter: Payer: BLUE CROSS/BLUE SHIELD | Admitting: Obstetrics and Gynecology

## 2017-11-02 ENCOUNTER — Ambulatory Visit: Payer: BLUE CROSS/BLUE SHIELD | Admitting: Obstetrics and Gynecology

## 2017-11-02 ENCOUNTER — Encounter: Payer: Self-pay | Admitting: Obstetrics and Gynecology

## 2017-11-02 VITALS — BP 131/82 | HR 72 | Ht 62.0 in | Wt 195.8 lb

## 2017-11-02 DIAGNOSIS — N809 Endometriosis, unspecified: Secondary | ICD-10-CM

## 2017-11-02 DIAGNOSIS — Z79818 Long term (current) use of other agents affecting estrogen receptors and estrogen levels: Secondary | ICD-10-CM | POA: Diagnosis not present

## 2017-11-02 DIAGNOSIS — N921 Excessive and frequent menstruation with irregular cycle: Secondary | ICD-10-CM

## 2017-11-02 DIAGNOSIS — D259 Leiomyoma of uterus, unspecified: Secondary | ICD-10-CM

## 2017-11-02 DIAGNOSIS — Z3202 Encounter for pregnancy test, result negative: Secondary | ICD-10-CM | POA: Diagnosis not present

## 2017-11-02 DIAGNOSIS — Z566 Other physical and mental strain related to work: Secondary | ICD-10-CM

## 2017-11-02 DIAGNOSIS — Z3009 Encounter for other general counseling and advice on contraception: Secondary | ICD-10-CM

## 2017-11-02 LAB — POCT URINE PREGNANCY: Preg Test, Ur: NEGATIVE

## 2017-11-02 MED ORDER — LEUPROLIDE ACETATE 3.75 MG IM KIT
3.7500 mg | PACK | Freq: Once | INTRAMUSCULAR | Status: AC
  Administered 2017-11-02: 3.75 mg via INTRAMUSCULAR

## 2017-11-02 NOTE — Progress Notes (Signed)
Pt stated that she is doing well. Pt is present today for birth control. UPT-negative

## 2017-11-02 NOTE — Progress Notes (Signed)
    GYNECOLOGY PROGRESS NOTE  Subjective:    Patient ID: Lynn Boyd, female    DOB: [birth year], 32 y.o.   MRN: [id number]  HPI  Patient is a 32 y.o. G0P0000 female who presents for further discussion of options for management of fibroid uterus, endometriosis, and irregular menses.  She has a prior history of myomectomy. She was supposed to initiate Lupron for management as previous management with contraceptives was not working any more, however notes that it has taken longer than she desired to get the medication and now desires to discuss other contraceptive options.  She also notes that she was concerned about side effects of the Lupron  (especially vaginal dryness), which also has deterred her from wanting to try the medicine. Notes that her PCP recently prescribed her Diclofenac-Potassium to help with her cycles, which helped for ~ 1 month, but this month the bleeding was heavier.   Patient also desires recommendations for a counselor.  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   The following portions of the patient's history were reviewed and updated as appropriate: allergies, current medications, past family history, past medical history, past social history, past surgical history and problem list.  Review of Systems Pertinent items noted in HPI and remainder of comprehensive ROS otherwise negative.   Objective:   Blood pressure 131/82, pulse 72, height 5\' 2"  (1.575 m), weight 195 lb 12.8 oz (88.8 kg), last menstrual period 10/22/2017. General appearance: alert and no distress Remainder of exam deferred.    Assessment:   Fibroid uterus Endometriosis  Irregular menses Stress at work  Plan:   - Discussed that patient's Lupron injection was indeed at the clinic and could be administered today if desired.  I also discussed Lupron vs other options  (birth control).  Also discussed patient's hesitancy regarding side effects of Lupron.  Discussed that the majority of patients that have used Lupron in my opinion often have hot flashes as their main side effect, which can be managed by add back therapy.  I also discussed that if she does experience vaginal dryness that there were management options for this symptom as well.  And that all symptoms would be temporary, and subject to resolve after discontinuation of medication.  Patient now willing to try 1 month of Lupron. 1st injection given today. UPT negative.  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 Can order next dose of Lupron at this time if she is doing well.   A total of 15 minutes were spent face-to-face with the patient during this encounter and over half of that time dealt with counseling and coordination of care.  Rubie Maid, MD [facility]

## 2017-11-16 ENCOUNTER — Ambulatory Visit: Payer: BLUE CROSS/BLUE SHIELD

## 2017-11-17 ENCOUNTER — Telehealth: Payer: Self-pay | Admitting: Obstetrics and Gynecology

## 2017-11-17 NOTE — Telephone Encounter (Signed)
The patient called and stated that she would like to know if her Lupron injection is here in the office. The patient would like a call back as soon as possible due to her wanting to hopefully come in  Today to get her Lupron injection. No other information was disclosed. Please advise.

## 2017-11-17 NOTE — Telephone Encounter (Signed)
Pt was called and informed that her Lupron had arrived and she could make an appointment to be seen tomorrow to have it. Pt stated that she would call back after she check her schedule.

## 2017-11-18 ENCOUNTER — Ambulatory Visit (INDEPENDENT_AMBULATORY_CARE_PROVIDER_SITE_OTHER): Payer: BLUE CROSS/BLUE SHIELD | Admitting: Obstetrics and Gynecology

## 2017-11-18 VITALS — BP 126/77 | HR 68 | Ht 62.0 in | Wt 192.9 lb

## 2017-11-18 DIAGNOSIS — N938 Other specified abnormal uterine and vaginal bleeding: Secondary | ICD-10-CM | POA: Diagnosis not present

## 2017-11-18 DIAGNOSIS — D259 Leiomyoma of uterus, unspecified: Secondary | ICD-10-CM

## 2017-11-18 DIAGNOSIS — Z79818 Long term (current) use of other agents affecting estrogen receptors and estrogen levels: Secondary | ICD-10-CM | POA: Diagnosis not present

## 2017-11-18 MED ORDER — LEUPROLIDE ACETATE 3.75 MG IM KIT
3.7500 mg | PACK | Freq: Once | INTRAMUSCULAR | Status: AC
  Administered 2017-11-18: 3.75 mg via INTRAMUSCULAR

## 2017-11-18 NOTE — Progress Notes (Signed)
Pt is present today for her 2nd Lupron injection. Pt stated that she is not having any side effects of the medication. Pt stated that her cycle stated yesterday. Pt stated that she is doing well. No complaints.   BP 126/77   Pulse 68   Ht 5\' 2"  (1.575 m)   Wt 192 lb 14.4 oz (87.5 kg)   LMP 11/17/2017   BMI 35.28 kg/m

## 2017-11-19 ENCOUNTER — Ambulatory Visit: Payer: BLUE CROSS/BLUE SHIELD

## 2017-11-19 NOTE — Progress Notes (Signed)
I have reviewed the record and concur with patient management and plan.  Aven Christen, MD [facility]     

## 2017-12-13 NOTE — Progress Notes (Deleted)
Pt presents today for lupron injection.

## 2017-12-17 ENCOUNTER — Ambulatory Visit: Payer: BLUE CROSS/BLUE SHIELD

## 2017-12-20 ENCOUNTER — Telehealth: Payer: Self-pay | Admitting: Obstetrics and Gynecology

## 2017-12-20 NOTE — Telephone Encounter (Signed)
I called the patient in regards to a no show and she wants the nurse to call her in regards to her Lupron and when she's able to come in

## 2017-12-21 ENCOUNTER — Other Ambulatory Visit: Payer: Self-pay

## 2017-12-21 NOTE — Telephone Encounter (Signed)
Spoke with pt concerning her Lupron injection. Pt was informed that her Lupron had not been shipped. Pt stated that she had called [facility] and made a payment and her shippment was going to be shipped on Monday. Called [facility] they stated that they did not receive a call from the pt. [facility] stated that they would contact pt and did not have any records of her making a payment.

## 2017-12-22 ENCOUNTER — Other Ambulatory Visit: Payer: Self-pay

## 2017-12-22 ENCOUNTER — Ambulatory Visit: Payer: BLUE CROSS/BLUE SHIELD | Admitting: Obstetrics and Gynecology

## 2017-12-22 ENCOUNTER — Telehealth: Payer: Self-pay

## 2017-12-22 ENCOUNTER — Telehealth: Payer: Self-pay | Admitting: Obstetrics and Gynecology

## 2017-12-22 NOTE — Telephone Encounter (Signed)
Pharmacy called and they will be shipping pt's lupron tomorrow and it should arrive on Thursday, Dec 23, 2017. Pt was called and informed of the date.

## 2017-12-22 NOTE — Telephone Encounter (Signed)
Spoke with pharmacy and they will be shipping pt's medication Lupron on Dec 23, 2017. Pt was called and informed that her medication was being shipped.

## 2017-12-22 NOTE — Telephone Encounter (Signed)
Pt was called no answer LM via voicemail that I had called the pharmacy and spoke with them about shipping her medication Lupron. Pt was informed that the pharmacy stated that her medication will be shipped out tomorrow and should arrive to the office by Thursday. Pt was informed that I would call or mychart her to let her know when it has arrived.

## 2017-12-22 NOTE — Telephone Encounter (Signed)
Pt was called no answer LM via voicemail that the pharmacy had been called and her medication should arrived around Thursday and she would receive a call when her medication arrive.

## 2017-12-22 NOTE — Telephone Encounter (Signed)
The patient called and stated that she needs [place] her nurse to contact [facility] to schedule the pick up/delivery of her Lupron to be delivered to the office. The patient disclosed that there number is: [phone]. The patient would also like a call back if possible to confirm when the Lupron will be in the office/ when a nurse has spoken with a representative. No other information was disclosed. Please advise.

## 2017-12-24 ENCOUNTER — Ambulatory Visit: Payer: BLUE CROSS/BLUE SHIELD | Admitting: Obstetrics and Gynecology

## 2017-12-24 VITALS — BP 152/87 | HR 83 | Ht 62.0 in | Wt 197.0 lb

## 2017-12-24 DIAGNOSIS — D259 Leiomyoma of uterus, unspecified: Secondary | ICD-10-CM

## 2017-12-24 MED ORDER — LEUPROLIDE ACETATE 3.75 MG IM KIT
3.7500 mg | PACK | Freq: Once | INTRAMUSCULAR | Status: AC
  Administered 2017-12-24: 3.75 mg via INTRAMUSCULAR

## 2017-12-24 NOTE — Progress Notes (Unsigned)
Pt presents today for lupron inj. Pt has no side effects. Pt would like to speak with Dr Marcelline Mates before scheduling another Lupron inj appt.

## 2018-01-17 ENCOUNTER — Encounter: Payer: BLUE CROSS/BLUE SHIELD | Admitting: Obstetrics and Gynecology

## 2018-01-21 ENCOUNTER — Ambulatory Visit: Payer: BLUE CROSS/BLUE SHIELD

## 2018-01-26 ENCOUNTER — Ambulatory Visit (INDEPENDENT_AMBULATORY_CARE_PROVIDER_SITE_OTHER): Payer: BLUE CROSS/BLUE SHIELD | Admitting: Obstetrics and Gynecology

## 2018-01-26 ENCOUNTER — Encounter: Payer: Self-pay | Admitting: Obstetrics and Gynecology

## 2018-01-26 ENCOUNTER — Ambulatory Visit (INDEPENDENT_AMBULATORY_CARE_PROVIDER_SITE_OTHER): Payer: BLUE CROSS/BLUE SHIELD

## 2018-01-26 ENCOUNTER — Other Ambulatory Visit: Payer: Self-pay | Admitting: Obstetrics and Gynecology

## 2018-01-26 VITALS — BP 161/97 | HR 73 | Ht 62.0 in | Wt 197.1 lb

## 2018-01-26 DIAGNOSIS — Z5181 Encounter for therapeutic drug level monitoring: Secondary | ICD-10-CM | POA: Diagnosis not present

## 2018-01-26 DIAGNOSIS — Z79818 Long term (current) use of other agents affecting estrogen receptors and estrogen levels: Secondary | ICD-10-CM

## 2018-01-26 DIAGNOSIS — N939 Abnormal uterine and vaginal bleeding, unspecified: Secondary | ICD-10-CM

## 2018-01-26 DIAGNOSIS — D219 Benign neoplasm of connective and other soft tissue, unspecified: Secondary | ICD-10-CM

## 2018-01-26 DIAGNOSIS — N809 Endometriosis, unspecified: Secondary | ICD-10-CM

## 2018-01-26 DIAGNOSIS — R232 Flushing: Secondary | ICD-10-CM

## 2018-01-26 MED ORDER — NORETHINDRONE ACETATE 5 MG PO TABS: 5.0000 mg | ORAL_TABLET | Freq: Every day | ORAL | 2 refills | Status: DC

## 2018-01-26 NOTE — Progress Notes (Signed)
Pt stated that Lupron is not working and she is still having cycles. No other complaints.

## 2018-01-26 NOTE — Progress Notes (Signed)
GYNECOLOGY PROGRESS NOTE  Subjective:    Patient ID: Lynn Boyd, female    DOB: Apr 28, 1985, 32 y.o.   MRN: [id number]  HPI  Patient is a 32 y.o. G0P0000 female with a past history of fibroids, and endometriosis who presents for follow up of Lupron therapy. She is now s/p her 3rd dose f the medication.  She initially felt like the medication was not working as she was still having heavy cycles, however notes that this month she may have just started her cycle and is only spotting.  She does note that she had to have an iron infusion earlier in November, and that her Hematologist told her to give the medication a little more time.   She is also beginning to note some bothersome hot flushes with use of the medication.   The following portions of the patient's history were reviewed and updated as appropriate: allergies, current medications, past family history, past medical history, past social history, past surgical history and problem list.  Review of Systems Pertinent items noted in HPI and remainder of comprehensive ROS otherwise negative.   Objective:   Blood pressure (!) 161/97, pulse 73, height 5\' 2"  (1.575 m), weight 197 lb 1.6 oz (89.4 kg), last menstrual period 12/22/2017. General appearance: alert and no distress Remainder of exam deferred.    Imaging:   ULTRASOUND REPORT  Location: [facility] Date of Service:  08/27/2017  **Transabdominal only due to uterine and fibroid size**  Indications: Fibroids Findings:  The uterus measures 13.2 x 11.6 x 6.7 cm. Echo texture is heterogeneous with evidence of focal masses. Within the uterus are multiple suspected fibroids measuring: Fibroid 1: Right fundal, 6.4 x 6.7 x 6.6 cm Fibroid 2: Right, 4.0 x 3.2 x 4.3 cm Fibroid 3: Left, 4.4 x 4.4 x 4.2 cm All fibroids appear to at least be abutting the endometrium.  It is difficult to delineate if they are invading the endometrium. The Endometrium is displaced by the  fibroids and is only visualized at the fundus of the uterus.  It measures 5.7 mm.  Right Ovary was not visualized. Left Ovary is enlarged and measures 5.1 x 3.4 x 3.1 cm. It contains a dominant follicle measuring 1.8 x 1.9 x 1.8 cm and a cyst measuring 3.2 x 2.1 x 2.5 cm. A large heterogeneous mass containing fluid and debris is noted in the right adnexa measuring 8.4 x 10.2 x 8.5 cm.  Internal flow is noted in various areas of the mass.  The mass extends from approximately the level of the umbilicus to the right epigastric region adjacent to the liver. There is free fluid in the cul de sac.  Impression: 1. Fibroid uterus.      Fundal - 6.4 x 6.7 x 6.6 cm      Right Lateral - 4.0 x 3.2 x 4.3 cm      Left Lateral - 4.4 x 4.4 x 4.2 cm  2. Endometrium measures 5.7 mm and is displaced by fibroids, only visualized at the fundus.  3. Left ovary is enlarged at 5.1 x 3.4 x 3.1 cm and contains a dominant follicle and a cyst measuring 3.2 x 2.1 x 2.5 cm.  4. Large, heterogeneous right adnexal mass containing fluid and debris measures 8.4 x 10.2 x 8.5 cm.  Internal flow is noted in various areas.  It extends from the level of the umbilicus to the right epigastric area adjacent to the liver.  This has increased in size since  previous ultrasound in July 2017.  5. Fluid noted in the cul de sac.  Recommendations: 1.Clinical correlation with the patient's History and Physical Exam.   Dario Ave, RDMS   US PELVIS (TRANSABDOMINAL ONLY) Patient Name: Lynn Boyd DOB: 11/04/1985 MRN: [id number]  ULTRASOUND REPORT  Location: [facility]  Date of Service: 01/26/2018 2  Indications:Multiple uterine fibroids Findings:  The uterus is anteverted and measures 12.3x8.4x9.8cm. Echo texture is heterogenous with evidence of focal masses. Within the uterus are multiple suspected fibroids measuring (could not  determine the exactly types of the fibroids,IM,SS or SS, d/t the big size  of  them) Fibroid 1: 7x6cm, to the left posterior uterine wall  Fibroid 2:5x4.5cm middle posterior ut wall Fibroid 3: 2.4x2cm Rt, anterior FB 4:  3.3x2.4cm middle anterior FB 5:  6.3x6.8 rt posterior The Endometrium could not see it due to big fibroids Right Ovary  could not see it due to big fibroids  Left Ovary  could not see it due to big fibroids  Survey of the adnexa demonstrates no adnexal masses. There is no free fluid in the cul de sac.  Impression: 1. Within the uterus are multiple suspected fibroids measuring (could not  determine the exactly types of the fibroids,IM,SS or SS, d/t the big size  of them) Fibroid 1: 7x6cm, to the left posterior uterine wall  Fibroid 2:5x4.5cm middle posterior ut wall Fibroid 3: 2.4x2cm Rt, anterior FB 4:  3.3x2.4cm middle anterior FB 5:  6.3x6.8 rt posterior  Recommendations: 1.Clinical correlation with the patient's History and Physical Exam.  Abeer Alsammarraie ,RDMS  I have reviewed this study and agree with documented findings.   Rubie Maid, MD [facility]   Assessment:   Uterine fibroids Endometriosis Lupron therapy Hot flushes  Plan:    - Patient with symptomatic uterine fibroids, h/o endometriosis currently on Lupron therapy.  Reviewed newest ultrasound results with patient, and compared to previous ultrasound in June 2019. Discussion had regarding continued use of the medication vs changing to a different management option.  Patient notes that she will continue use of the medication for now for an additional 3 months of treatment.  Will f/u again in 3 months, will need repeat ultrasound at that time.   - Lupron therapy, can continue for 3 additional months for management of fibroids.  - Hot flushes on Lupron. Discussed option of add-back therapy.  Will give prescription for Aygestin.   A total of 15 minutes were spent face-to-face with the patient during this encounter and over half of that time dealt with  counseling and coordination of care.   Rubie Maid, MD [facility]

## 2018-02-01 ENCOUNTER — Telehealth: Payer: Self-pay

## 2018-02-01 NOTE — Telephone Encounter (Signed)
Pt called and soon as she answered the phone she asked if she could call me back. Sent pt a mychart message to let her know that her Lupron had arrived and she needed to call and make an appointment.

## 2018-02-01 NOTE — Telephone Encounter (Signed)
Pt called and aware that her lupron has arrived and she made an appointment for her injection.

## 2018-02-09 ENCOUNTER — Ambulatory Visit (INDEPENDENT_AMBULATORY_CARE_PROVIDER_SITE_OTHER): Payer: BLUE CROSS/BLUE SHIELD | Admitting: Obstetrics and Gynecology

## 2018-02-09 ENCOUNTER — Encounter: Payer: Self-pay | Admitting: Obstetrics and Gynecology

## 2018-02-09 VITALS — BP 149/90 | HR 66 | Ht 62.0 in | Wt 198.1 lb

## 2018-02-09 DIAGNOSIS — R399 Unspecified symptoms and signs involving the genitourinary system: Secondary | ICD-10-CM

## 2018-02-09 DIAGNOSIS — D219 Benign neoplasm of connective and other soft tissue, unspecified: Secondary | ICD-10-CM | POA: Diagnosis not present

## 2018-02-09 DIAGNOSIS — Z5181 Encounter for therapeutic drug level monitoring: Secondary | ICD-10-CM | POA: Diagnosis not present

## 2018-02-09 DIAGNOSIS — Z79818 Long term (current) use of other agents affecting estrogen receptors and estrogen levels: Secondary | ICD-10-CM

## 2018-02-09 LAB — POCT URINALYSIS DIPSTICK
Bilirubin, UA: NEGATIVE
Glucose, UA: NEGATIVE
Ketones, UA: NEGATIVE
Leukocytes, UA: NEGATIVE
Nitrite, UA: NEGATIVE
Protein, UA: NEGATIVE
Spec Grav, UA: 1.015 (ref 1.010–1.025)
Urobilinogen, UA: 0.2 E.U./dL
pH, UA: 7.5 (ref 5.0–8.0)

## 2018-02-09 MED ORDER — LEUPROLIDE ACETATE 3.75 MG IM KIT
3.7500 mg | PACK | Freq: Once | INTRAMUSCULAR | Status: AC
  Administered 2018-02-09: 3.75 mg via INTRAMUSCULAR

## 2018-02-09 NOTE — Progress Notes (Signed)
Pt is present today for her lupron injection. Pt stated that the medication is not helping. Pt stated that she has been spotting everyday since Oct along with clots and cramping. Pt stated that she think she may have an UTI. UTI test done.  Pt stated that she wanted to discuss surgery sooner than February.

## 2018-02-10 ENCOUNTER — Encounter: Payer: Self-pay | Admitting: Obstetrics and Gynecology

## 2018-02-10 NOTE — Progress Notes (Signed)
I have reviewed the record and concur with patient management and plan. Patient can schedule an appointment sooner to discuss surgical management of her fibroids and DUB.   Rubie Maid, MD [facility]

## 2018-03-16 ENCOUNTER — Encounter: Payer: BLUE CROSS/BLUE SHIELD | Admitting: Obstetrics and Gynecology

## 2018-04-06 ENCOUNTER — Encounter: Payer: BLUE CROSS/BLUE SHIELD | Admitting: Obstetrics and Gynecology

## 2018-04-06 ENCOUNTER — Telehealth: Payer: Self-pay

## 2018-04-06 NOTE — Telephone Encounter (Signed)
Pt called to reschedule her appointment this evening due to [facility] being sick. Pt rescheduled her appointment for 04/13/18.

## 2018-04-07 ENCOUNTER — Encounter: Payer: BLUE CROSS/BLUE SHIELD | Admitting: Obstetrics and Gynecology

## 2018-04-12 ENCOUNTER — Encounter: Payer: Self-pay | Admitting: Obstetrics and Gynecology

## 2018-04-12 ENCOUNTER — Ambulatory Visit (INDEPENDENT_AMBULATORY_CARE_PROVIDER_SITE_OTHER): Payer: BLUE CROSS/BLUE SHIELD | Admitting: Obstetrics and Gynecology

## 2018-04-12 VITALS — BP 133/85 | HR 65 | Ht 62.0 in | Wt 201.8 lb

## 2018-04-12 DIAGNOSIS — N938 Other specified abnormal uterine and vaginal bleeding: Secondary | ICD-10-CM

## 2018-04-12 DIAGNOSIS — D259 Leiomyoma of uterus, unspecified: Secondary | ICD-10-CM

## 2018-04-12 DIAGNOSIS — Z79818 Long term (current) use of other agents affecting estrogen receptors and estrogen levels: Secondary | ICD-10-CM | POA: Diagnosis not present

## 2018-04-12 MED ORDER — MEDROXYPROGESTERONE ACETATE 5 MG PO TABS: 5.0000 mg | ORAL_TABLET | Freq: Every day | ORAL | 0 refills | Status: DC

## 2018-04-12 NOTE — Progress Notes (Signed)
Pt is here to discuss fibroid issues. Everything else is ok.

## 2018-04-12 NOTE — Progress Notes (Signed)
    GYNECOLOGY PROGRESS NOTE  Subjective:    Patient ID: Lynn Boyd, female    DOB: 27-Nov-1985, 33 y.o.   MRN: [id number]  HPI  Patient is a 33 y.o. G0P0000 female who presents for follow up of Lupron therapy. Received last injection in December (received 5-month dosing). She notes she is still having periods, but not as heavy as before. Is spotting every day.  Also noting clotting with cycles.   The following portions of the patient's history were reviewed and updated as appropriate: allergies, current medications, past family history, past medical history, past social history, past surgical history and problem list.  Review of Systems Pertinent items noted in HPI and remainder of comprehensive ROS otherwise negative.   Objective:   Blood pressure 133/85, pulse 65, height 5\' 2"  (1.575 m), weight 201 lb 12.8 oz (91.5 kg), last menstrual period 04/09/2018. General appearance: alert and no distress Remainder of exam deferred.    Assessment:   Fibroid uterus Lupron therapy Dysfunctional uterine bleeding  Plan:   Discussion had on management options after Lupron therapy. Will need ultrasound for final assessment to see if any further change in uterine size has been noted.  Other options include surgical management with myomectomy, vs medical management with combined contraception (OCPs, NuvaRing, patch), or possible IUD placement depending on size and location of fibroids. Considering surgery as she does not think that the Lupron has done much in the way of shrinking her fibroids, but will make final decision after ultrasound.   Rubie Maid, MD [facility]

## 2018-04-13 ENCOUNTER — Encounter: Payer: Self-pay | Admitting: Obstetrics and Gynecology

## 2018-04-14 NOTE — Addendum Note (Signed)
Addended by: Augusto Gamble on: 04/14/2018 10:41 AM   Modules accepted: Orders

## 2018-04-15 ENCOUNTER — Ambulatory Visit
Admission: RE | Admit: 2018-04-15 | Discharge: 2018-04-15 | Disposition: A | Discharge status: Home / Self Care | Payer: BLUE CROSS/BLUE SHIELD | Source: Ambulatory Visit | Attending: Obstetrics and Gynecology | Admitting: Obstetrics and Gynecology

## 2018-04-15 DIAGNOSIS — Z79818 Long term (current) use of other agents affecting estrogen receptors and estrogen levels: Secondary | ICD-10-CM | POA: Insufficient documentation

## 2018-04-15 DIAGNOSIS — D259 Leiomyoma of uterus, unspecified: Secondary | ICD-10-CM | POA: Insufficient documentation

## 2018-04-15 DIAGNOSIS — N938 Other specified abnormal uterine and vaginal bleeding: Secondary | ICD-10-CM

## 2018-04-26 ENCOUNTER — Emergency Department
Admission: EM | Admit: 2018-04-26 | Discharge: 2018-04-26 | Disposition: A | Discharge status: Home / Self Care | Payer: BLUE CROSS/BLUE SHIELD | Attending: Emergency Medicine | Admitting: Emergency Medicine

## 2018-04-26 ENCOUNTER — Other Ambulatory Visit: Payer: Self-pay

## 2018-04-26 ENCOUNTER — Ambulatory Visit: Payer: BLUE CROSS/BLUE SHIELD | Admitting: Obstetrics and Gynecology

## 2018-04-26 ENCOUNTER — Encounter: Payer: Self-pay | Admitting: Emergency Medicine

## 2018-04-26 DIAGNOSIS — Z79899 Other long term (current) drug therapy: Secondary | ICD-10-CM | POA: Insufficient documentation

## 2018-04-26 DIAGNOSIS — N921 Excessive and frequent menstruation with irregular cycle: Secondary | ICD-10-CM | POA: Diagnosis not present

## 2018-04-26 DIAGNOSIS — I10 Essential (primary) hypertension: Secondary | ICD-10-CM | POA: Insufficient documentation

## 2018-04-26 DIAGNOSIS — R102 Pelvic and perineal pain: Secondary | ICD-10-CM | POA: Diagnosis present

## 2018-04-26 LAB — COMPREHENSIVE METABOLIC PANEL
ALT: 21 U/L (ref 0–44)
AST: 23 U/L (ref 15–41)
Albumin: 4.3 g/dL (ref 3.5–5.0)
Alkaline Phosphatase: 50 U/L (ref 38–126)
Anion gap: 8 (ref 5–15)
BUN: 16 mg/dL (ref 6–20)
CO2: 26 mmol/L (ref 22–32)
Calcium: 9.2 mg/dL (ref 8.9–10.3)
Chloride: 105 mmol/L (ref 98–111)
Creatinine, Ser: 0.94 mg/dL (ref 0.44–1.00)
GFR calc Af Amer: >60 mL/min (ref >60)
GFR calc non Af Amer: >60 mL/min (ref >60)
Glucose, Bld: 112 mg/dL — ABNORMAL HIGH (ref 70–99)
Potassium: 3.5 mmol/L (ref 3.5–5.1)
Sodium: 139 mmol/L (ref 135–145)
Total Bilirubin: 0.4 mg/dL (ref 0.3–1.2)
Total Protein: 7.2 g/dL (ref 6.5–8.1)

## 2018-04-26 LAB — URINALYSIS, COMPLETE (UACMP) WITH MICROSCOPIC
Bacteria, UA: NONE SEEN
Bilirubin Urine: NEGATIVE
Glucose, UA: NEGATIVE mg/dL
Ketones, ur: NEGATIVE mg/dL
Nitrite: NEGATIVE
Protein, ur: NEGATIVE mg/dL
RBC / HPF: >50 RBC/hpf — ABNORMAL HIGH (ref 0–5)
Specific Gravity, Urine: 1.019 (ref 1.005–1.030)
pH: 5 (ref 5.0–8.0)

## 2018-04-26 LAB — CBC WITH DIFFERENTIAL/PLATELET
Abs Immature Granulocytes: 0.04 10*3/uL (ref 0.00–0.07)
Basophils Absolute: 0 10*3/uL (ref 0.0–0.1)
Basophils Relative: 0 %
Eosinophils Absolute: 0.2 10*3/uL (ref 0.0–0.5)
Eosinophils Relative: 3 %
HCT: 29.8 % — ABNORMAL LOW (ref 36.0–46.0)
Hemoglobin: 9.8 g/dL — ABNORMAL LOW (ref 12.0–15.0)
Immature Granulocytes: 1 %
Lymphocytes Relative: 20 %
Lymphs Abs: 1.2 10*3/uL (ref 0.7–4.0)
MCH: 30.1 pg (ref 26.0–34.0)
MCHC: 32.9 g/dL (ref 30.0–36.0)
MCV: 91.4 fL (ref 80.0–100.0)
Monocytes Absolute: 0.4 10*3/uL (ref 0.1–1.0)
Monocytes Relative: 6 %
Neutro Abs: 4.2 10*3/uL (ref 1.7–7.7)
Neutrophils Relative %: 70 %
Platelets: 220 10*3/uL (ref 150–400)
RBC: 3.26 MIL/uL — ABNORMAL LOW (ref 3.87–5.11)
RDW: 14.5 % (ref 11.5–15.5)
WBC: 6 10*3/uL (ref 4.0–10.5)
nRBC: 0 % (ref 0.0–0.2)

## 2018-04-26 LAB — POCT PREGNANCY, URINE: Preg Test, Ur: NEGATIVE

## 2018-04-26 MED ORDER — KETOROLAC TROMETHAMINE 30 MG/ML IJ SOLN
30.0000 mg | Freq: Once | INTRAMUSCULAR | Status: AC
  Administered 2018-04-26: 30 mg via INTRAMUSCULAR

## 2018-04-26 MED ORDER — TRAMADOL HCL 50 MG PO TABS: 50.0000 mg | ORAL_TABLET | Freq: Four times a day (QID) | ORAL | 0 refills | Status: DC | PRN

## 2018-04-26 MED ORDER — KETOROLAC TROMETHAMINE 30 MG/ML IJ SOLN
INTRAMUSCULAR | Status: AC
  Filled 2018-04-26: qty 1

## 2018-04-26 NOTE — ED Provider Notes (Signed)
[facility] Emergency Department Provider Note   ____________________________________________    I have reviewed the triage vital signs and the nursing notes.   HISTORY  Chief Complaint Abdominal Pain     HPI Lynn Boyd is a 33 y.o. female who presents with complaints of pelvic cramping.  Patient reports a history of pelvic cramping with heavy menstruation related to fibroids, she actually had an appointment with her gynecologist today to discuss myomectomy because they have tried multiple different medications without success however her gynecologist was called away to an emergency C-section and so her appointment was missed.  Therefore she was unable to get a prescription for anything for her continued pelvic cramping, follow-up appointment is not for 2 to 3 weeks.  Past Medical History:  Diagnosis Date  . Chronic blood loss anemia   . Endometriosis   . Enlarged heart   . Fibroid uterus   . Heart murmur   . Herpes simplex virus (HSV) infection   . History of blood transfusion 2016   2 units, given for chronic blood loss anemia   . Hypertension   . TOA (tubo-ovarian abscess) 11/28/2014  . Vaginal Pap smear, abnormal 2017   ASCUS with HPV+    Patient Active Problem List   Diagnosis Date Noted  . Fibroid uterus 08/08/2017  . Obstructive sleep apnea 03/15/2017  . Hypertrophic cardiomyopathy ([place]) 03/05/2017  . Abnormal EKG 02/22/2017  . HSV (herpes simplex virus) infection 05/01/2016  . Acne 04/03/2016  . Obesity 04/03/2016  . History of PID 12/18/2014  . Cardiac murmur 08/31/2014  . ASCUS (atypical squamous cells of undetermined significance) on Pap smear 07/10/2011  . Endometriosis of pelvic peritoneum 06/04/2011  . ANEMIA-IRON DEFICIENCY 05/04/2010  . HYPERTENSION 05/04/2010  . 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 endometreoisis     surgery  . IR IMAGE GUIDED DRAINAGE BY PERCUTANEOUS  CATHETER  2016   left tubo-ovarian abscess, performed at [place]  05/2010   [facility]    Prior to Admission medications   Medication Sig Start Date End Date Taking? Authorizing Provider  acetaminophen (TYLENOL) 500 MG tablet Take 1-2 tablets by mouth every 8 (eight) hours as needed.    [provider]  azelastine (ASTELIN) 0.1 % nasal spray Place 1 spray into the nose 2 (two) times daily. 05/05/17   [provider]  diclofenac (CATAFLAM) 50 MG tablet Take 50 mg by mouth 3 (three) times daily.    [provider]  ferrous sulfate 325 (65 FE) MG EC tablet Take 325 mg by mouth 3 (three) times daily with meals.    [provider]  fluticasone (FLONASE) 50 MCG/ACT nasal spray Place into both nostrils daily.    [provider]  medroxyPROGESTERone (PROVERA) 5 MG tablet Take 1 tablet (5 mg total) by mouth daily. 04/12/18   Rubie Maid, MD  metoprolol tartrate (LOPRESSOR) 25 MG tablet Take 25 mg by mouth 2 (two) times daily.    [provider]  Naltrexone-buPROPion HCl ER (CONTRAVE) 8-90 MG TB12 Take by mouth.    [provider]  naproxen sodium (ANAPROX) 220 MG tablet Take 440 mg by mouth every 8 (eight) hours as needed (menstrual cramps).    [provider]  spironolactone (ALDACTONE) 50 MG tablet Take 50 mg by mouth daily.    [provider]  traMADol (ULTRAM) 50 MG tablet Take 1 tablet (50 mg total) by  mouth every 6 (six) hours as needed. 04/26/18 04/26/19  Lavonia Drafts, MD  tretinoin (RETIN-A) 0.025 % cream Apply topically at bedtime.    [provider]  valACYclovir (VALTREX) 500 MG tablet Take 500 mg by mouth 2 (two) times daily as needed (flare-ups).    [provider]     Allergies Bactrim [sulfamethoxazole-trimethoprim] and Latex  Family History  Problem Relation Age of Onset  . Hypertension Mother   . Hypertension Father     Social History Social History    Tobacco Use  . Smoking status: Never Smoker  . Smokeless tobacco: Never Used  Substance Use Topics  . Alcohol use: Not Currently    Alcohol/week: 0.0 standard drinks    Comment: socially  . Drug use: No    Review of Systems  Constitutional: No fever/chills Eyes: No visual changes.  ENT: No sore throat. Cardiovascular: Denies chest pain. Respiratory: Denies shortness of breath. Gastrointestinal: As above Genitourinary: As above Musculoskeletal: Negative for back pain. Skin: Negative for rash. Neurological: Negative for headaches or weakness   ____________________________________________   PHYSICAL EXAM:  VITAL SIGNS: ED Triage Vitals  Enc Vitals Group     BP 04/26/18 2047 (!) 152/87     Pulse Rate 04/26/18 2047 74     Resp 04/26/18 2047 18     Temp 04/26/18 2047 97.9 F (36.6 C)     Temp Source 04/26/18 2047 Oral     SpO2 04/26/18 2047 100 %     Weight 04/26/18 2049 89.8 kg (198 lb)     Height 04/26/18 2049 1.575 m (5\' 2" )     Head Circumference --      Peak Flow --      Pain Score 04/26/18 2048 3     Pain Loc --      Pain Edu? --      Excl. in [place]? --     Constitutional: Alert and oriented. No acute distress. Pleasant and interactive  Nose: No congestion/rhinnorhea. Mouth/Throat: Mucous membranes are moist.    Cardiovascular: Normal rate, regular rhythm. G  Good peripheral circulation. Respiratory: Normal respiratory effort.  No retractions.  Gastrointestinal: Soft and nontender. No distention.   Genitourinary: deferred Musculoskeletal: No lower extremity tenderness nor edema.  Warm and well perfused Neurologic:  Normal speech and language. No gross focal neurologic deficits are appreciated.  Skin:  Skin is warm, dry and intact. No rash noted. Psychiatric: Mood and affect are normal. Speech and behavior are normal.  ____________________________________________   LABS (all labs ordered are listed, but only abnormal results are displayed)  Labs  Reviewed  CBC WITH DIFFERENTIAL/PLATELET - Abnormal; Notable for the following components:      Result Value   RBC 3.26 (*)    Hemoglobin 9.8 (*)    HCT 29.8 (*)    All other components within normal limits  COMPREHENSIVE METABOLIC PANEL - Abnormal; Notable for the following components:   Glucose, Bld 112 (*)    All other components within normal limits  URINALYSIS, COMPLETE (UACMP) WITH MICROSCOPIC - Abnormal; Notable for the following components:   Color, Urine YELLOW (*)    APPearance CLOUDY (*)    Hgb urine dipstick LARGE (*)    Leukocytes,Ua MODERATE (*)    RBC / HPF >50 (*)    All other components within normal limits  POCT PREGNANCY, URINE   ____________________________________________  EKG  None ____________________________________________  RADIOLOGY  None ____________________________________________   PROCEDURES  Procedure(s) performed: No  Procedures  Critical Care performed: No ____________________________________________   INITIAL IMPRESSION / ASSESSMENT AND PLAN / ED COURSE  Pertinent labs & imaging results that were available during my care of the patient were reviewed by me and considered in my medical decision making (see chart for details).  Patient treated with IM Toradol here, will prescribe prescription for tramadol as an outpatient, close follow-up with gynecology    ____________________________________________   FINAL CLINICAL IMPRESSION(S) / ED DIAGNOSES  Final diagnoses:  Pelvic cramping  Menorrhagia with irregular cycle        Note:  This document was prepared using Dragon voice recognition software and may include unintentional dictation errors.   Lavonia Drafts, MD 04/26/18 2145

## 2018-04-26 NOTE — ED Triage Notes (Signed)
Patient ambulatory to triage with steady gait, without difficulty or distress noted; pt reports taking aleve and tylenol today for uterine fibroids and ovarian cysts; st right and mid lower abd pain with vag bleeding x 60mos; st Gyn appt was canelled today to discuss possible surgery due to an emergency and she needs medication for pain to get her thru until her procedure

## 2018-04-26 NOTE — ED Notes (Signed)
Pt presents to ED via POV with c/o needing pain medication. Pt states was supposed to have myomectomy again and was scheduled to meet with GYN to discuss today, however her appt was cancelled due to an emergency. Pt states has known fibroids, and abnormal uterine bleeding x 3 months despite being on meds. Pt c/o generalized abdominal cramping and intermittent heavy flow with clots. Pt is alert and oriented, NAD noted at this time.

## 2018-04-28 ENCOUNTER — Encounter: Payer: BLUE CROSS/BLUE SHIELD | Admitting: Obstetrics and Gynecology

## 2018-04-29 ENCOUNTER — Ambulatory Visit (INDEPENDENT_AMBULATORY_CARE_PROVIDER_SITE_OTHER): Payer: BLUE CROSS/BLUE SHIELD | Admitting: Obstetrics and Gynecology

## 2018-04-29 ENCOUNTER — Encounter

## 2018-04-29 ENCOUNTER — Encounter: Payer: Self-pay | Admitting: Obstetrics and Gynecology

## 2018-04-29 ENCOUNTER — Ambulatory Visit: Payer: Self-pay | Admitting: Obstetrics and Gynecology

## 2018-04-29 VITALS — BP 119/77 | HR 80 | Ht 62.0 in | Wt 208.6 lb

## 2018-04-29 DIAGNOSIS — R102 Pelvic and perineal pain: Secondary | ICD-10-CM

## 2018-04-29 DIAGNOSIS — Z862 Personal history of diseases of the blood and blood-forming organs and certain disorders involving the immune mechanism: Secondary | ICD-10-CM

## 2018-04-29 DIAGNOSIS — D250 Submucous leiomyoma of uterus: Secondary | ICD-10-CM

## 2018-04-29 DIAGNOSIS — N938 Other specified abnormal uterine and vaginal bleeding: Secondary | ICD-10-CM

## 2018-04-29 DIAGNOSIS — N83201 Unspecified ovarian cyst, right side: Secondary | ICD-10-CM

## 2018-04-29 DIAGNOSIS — Z9889 Other specified postprocedural states: Secondary | ICD-10-CM

## 2018-04-29 DIAGNOSIS — D252 Subserosal leiomyoma of uterus: Secondary | ICD-10-CM

## 2018-04-29 NOTE — Progress Notes (Signed)
Pt is present today to discuss surgery for her fibroids.

## 2018-04-29 NOTE — Progress Notes (Signed)
GYNECOLOGY PROGRESS NOTE  Subjective:    Patient ID: Lynn Boyd, female    DOB: Jun 07, 1985, 33 y.o.   MRN: [id number]  HPI  Patient is a 33 y.o. G0P0000 female who presents for discussion of management for her fibroid uterus and dysfunctional uterine bleeding.  She has completed Lupron therapy for 6 months, without significant reduction in her symptoms.  Was seen in the Emergency Room several weeks ago from increasing pain, however she does not believe it was due to the fibroids, but the ovarian cyst that is also present. Also noted that pain was more intense when she was very active (i.e.reports moving to a new house several weeks ago, thinks she overdid it). Notes that she has reached the decision that she would like to undergo a myomectomy.  Has had a history of 1 prior myomectomy in the past.   The following portions of the patient's history were reviewed and updated as appropriate: allergies, current medications, past family history, past medical history, past social history, past surgical history and problem list.  Review of Systems Pertinent items noted in HPI and remainder of comprehensive ROS otherwise negative.   Objective:   Blood pressure 119/77, pulse 80, height 5\' 2"  (1.575 m), weight 208 lb 9.6 oz (94.6 kg), last menstrual period 04/26/2018. General appearance: alert and no distress Abdomen: soft, mildly tender in lower abdomen.  Mass (uterus) palpable midway between pubic symphysis and umbilicus, mildly tender, mobile. Pelvic: deferred     Imaging:  US PELVIS TRANSVANGINAL NON-OB (TV ONLY) CLINICAL DATA:  Uterine fibroids, on Lupron  EXAM: TRANSABDOMINAL AND TRANSVAGINAL ULTRASOUND OF PELVIS  TECHNIQUE: Both transabdominal and transvaginal ultrasound examinations of the pelvis were performed. Transabdominal technique was performed for global imaging of the pelvis including uterus, ovaries, adnexal regions, and pelvic cul-de-sac. It was necessary to proceed  with endovaginal exam following the transabdominal exam to visualize the endometrium.  COMPARISON:  10/03/2015  FINDINGS: Uterus  Measurements: 15.0 x 8.0 x 12.2 cm = volume: 770 mL. Enlarged and heterogeneous containing multiple leiomyomata. Measured lesions include a posterior upper uterine submucosal leiomyoma 5.6 x 5.4 x 5.7 cm, a subserosal upper uterine leiomyoma on RIGHT near fundus 3.9 x 3.8 x 4.0 cm, and a submucosal central leiomyoma 4.8 x 4.7 x 4.2 cm. Additional smaller lesions are also identified.  Endometrium  Thickness: Obscured by multiple uterine masses unable to assess.  Right ovary  Measurements: 5.9 x 3.0 x 4.9 cm = volume: 44.5 mL. Dominant cyst with scattered low level internal echoes, lesion measuring 5.0 x 3.0 x 3.2 cm.  Left ovary  Measurements: 4.7 x 4.0 x 3.4 cm = volume: 32.9 mL. Normal morphology without mass  Other findings  No free pelvic fluid.  No additional adnexal masses.  IMPRESSION: Enlarged uterus containing multiple leiomyomata several of which are submucosal.  Endometrial complex is obscured, unable to assess thickness.  5.0 cm greatest diameter RIGHT ovarian cyst complicated by scattered low level internal echogenicity; this has significantly decreased in size since a RIGHT ovarian/adnexal cystic lesion of 10/03/2015 question hemorrhagic cyst versus endometrioma.  Electronically Signed   By: Lavonia Dana M.D.   On: 04/15/2018 16:25 US PELVIS (TRANSABDOMINAL ONLY) CLINICAL DATA:  Uterine fibroids, on Lupron  EXAM: TRANSABDOMINAL AND TRANSVAGINAL ULTRASOUND OF PELVIS  TECHNIQUE: Both transabdominal and transvaginal ultrasound examinations of the pelvis were performed. Transabdominal technique was performed for global imaging of the pelvis including uterus, ovaries, adnexal regions, and pelvic cul-de-sac. It was necessary to proceed with  endovaginal exam following the transabdominal exam to visualize  the endometrium.  COMPARISON:  10/03/2015  FINDINGS: Uterus  Measurements: 15.0 x 8.0 x 12.2 cm = volume: 770 mL. Enlarged and heterogeneous containing multiple leiomyomata. Measured lesions include a posterior upper uterine submucosal leiomyoma 5.6 x 5.4 x 5.7 cm, a subserosal upper uterine leiomyoma on RIGHT near fundus 3.9 x 3.8 x 4.0 cm, and a submucosal central leiomyoma 4.8 x 4.7 x 4.2 cm. Additional smaller lesions are also identified.  Endometrium  Thickness: Obscured by multiple uterine masses unable to assess.  Right ovary  Measurements: 5.9 x 3.0 x 4.9 cm = volume: 44.5 mL. Dominant cyst with scattered low level internal echoes, lesion measuring 5.0 x 3.0 x 3.2 cm.  Left ovary  Measurements: 4.7 x 4.0 x 3.4 cm = volume: 32.9 mL. Normal morphology without mass  Other findings  No free pelvic fluid.  No additional adnexal masses.  IMPRESSION: Enlarged uterus containing multiple leiomyomata several of which are submucosal.  Endometrial complex is obscured, unable to assess thickness.  5.0 cm greatest diameter RIGHT ovarian cyst complicated by scattered low level internal echogenicity; this has significantly decreased in size since a RIGHT ovarian/adnexal cystic lesion of 10/03/2015 question hemorrhagic cyst versus endometrioma.  Electronically Signed   By: Lavonia Dana M.D.   On: 04/15/2018 16:25    Assessment:   Fibroid uterus Pelvic pain DUB Prior history of myomectomy History of anemia Right ovarian cyst  Plan:   - Patient desires surgical management with myomectomy (robotic).  The risks of surgery were discussed in detail with the patient including but not limited to: bleeding which may require transfusion or reoperation; infection which may require prolonged hospitalization or re-hospitalization and antibiotic therapy; injury to bowel, bladder, ureters and major vessels or other surrounding organs; need for additional procedures including  laparotomy; thromboembolic phenomenon, incisional problems and other postoperative or anesthesia complications.  Patient was told that the likelihood that her condition and symptoms will be treated effectively with this surgical management was very high; the postoperative expectations were also discussed in detail. The patient also understands the alternative treatment options which were discussed in full. All questions were answered.  She will return in 3 weeks for pre-operative visit.  - Continue Tramadol as needed for intermittent pelvic pain. Patient notes that this helps significantly reduce her pain.  - History of anemia, last Hgb 9.8. Is due for another iron infusion in late March but does not desire to wait that long to schedule a surgery.  Patient also supposed to be taking iron oral supplementation 3 times daily, however notes she has not been as compliant. Advised on better compliance with iron tablets, will repeat levels at pre-op visit. If still low, discussed possibility of pre-operative or post-operative blood transfusion during her admission.    A total of 15 minutes were spent face-to-face with the patient during this encounter and over half of that time dealt with counseling and coordination of care.   Rubie Maid, MD [facility]

## 2018-05-06 ENCOUNTER — Other Ambulatory Visit: Payer: Self-pay | Admitting: Obstetrics and Gynecology

## 2018-05-06 ENCOUNTER — Telehealth: Payer: Self-pay | Admitting: Obstetrics and Gynecology

## 2018-05-06 ENCOUNTER — Other Ambulatory Visit: Payer: Self-pay

## 2018-05-06 ENCOUNTER — Emergency Department
Admission: EM | Admit: 2018-05-06 | Discharge: 2018-05-06 | Disposition: A | Discharge status: Home / Self Care | Payer: BLUE CROSS/BLUE SHIELD | Attending: Emergency Medicine | Admitting: Emergency Medicine

## 2018-05-06 DIAGNOSIS — R109 Unspecified abdominal pain: Secondary | ICD-10-CM | POA: Diagnosis not present

## 2018-05-06 DIAGNOSIS — Z5321 Procedure and treatment not carried out due to patient leaving prior to being seen by health care provider: Secondary | ICD-10-CM | POA: Insufficient documentation

## 2018-05-06 LAB — COMPREHENSIVE METABOLIC PANEL
ALT: 21 U/L (ref 0–44)
AST: 25 U/L (ref 15–41)
Albumin: 4.1 g/dL (ref 3.5–5.0)
Alkaline Phosphatase: 45 U/L (ref 38–126)
Anion gap: 9 (ref 5–15)
BUN: 16 mg/dL (ref 6–20)
CO2: 24 mmol/L (ref 22–32)
Calcium: 9.5 mg/dL (ref 8.9–10.3)
Chloride: 107 mmol/L (ref 98–111)
Creatinine, Ser: 0.85 mg/dL (ref 0.44–1.00)
GFR calc Af Amer: >60 mL/min (ref >60)
GFR calc non Af Amer: >60 mL/min (ref >60)
Glucose, Bld: 115 mg/dL — ABNORMAL HIGH (ref 70–99)
Potassium: 3.9 mmol/L (ref 3.5–5.1)
Sodium: 140 mmol/L (ref 135–145)
Total Bilirubin: 0.6 mg/dL (ref 0.3–1.2)
Total Protein: 7.9 g/dL (ref 6.5–8.1)

## 2018-05-06 LAB — CBC
HCT: 32.6 % — ABNORMAL LOW (ref 36.0–46.0)
Hemoglobin: 10.5 g/dL — ABNORMAL LOW (ref 12.0–15.0)
MCH: 30.1 pg (ref 26.0–34.0)
MCHC: 32.2 g/dL (ref 30.0–36.0)
MCV: 93.4 fL (ref 80.0–100.0)
Platelets: 331 10*3/uL (ref 150–400)
RBC: 3.49 MIL/uL — ABNORMAL LOW (ref 3.87–5.11)
RDW: 15 % (ref 11.5–15.5)
WBC: 9.5 10*3/uL (ref 4.0–10.5)
nRBC: 0 % (ref 0.0–0.2)

## 2018-05-06 LAB — URINALYSIS, COMPLETE (UACMP) WITH MICROSCOPIC
Bacteria, UA: NONE SEEN
Bilirubin Urine: NEGATIVE
Glucose, UA: NEGATIVE mg/dL
Ketones, ur: NEGATIVE mg/dL
Nitrite: NEGATIVE
Protein, ur: NEGATIVE mg/dL
RBC / HPF: >50 RBC/hpf — ABNORMAL HIGH (ref 0–5)
Specific Gravity, Urine: 1.021 (ref 1.005–1.030)
pH: 5 (ref 5.0–8.0)

## 2018-05-06 LAB — LIPASE, BLOOD: Lipase: 20 U/L (ref 11–51)

## 2018-05-06 LAB — POCT PREGNANCY, URINE: Preg Test, Ur: NEGATIVE

## 2018-05-06 MED ORDER — HYDROCODONE-ACETAMINOPHEN 5-325 MG PO TABS: 1.0000 | ORAL_TABLET | Freq: Four times a day (QID) | ORAL | 0 refills | Status: DC | PRN

## 2018-05-06 MED ORDER — SODIUM CHLORIDE 0.9% FLUSH: 3.0000 mL | Freq: Once | INTRAVENOUS | Status: DC

## 2018-05-06 NOTE — Telephone Encounter (Signed)
The patient called back again asking to speak to her nurse.  Luana Shu stated she would speak to the patient.

## 2018-05-06 NOTE — Telephone Encounter (Signed)
The patient called the on call nurse at 7:52 am today 05/06/18 complaining of severe abdominal pain, The patient hung up and stated she was going to ED. Fax was sent by on call nurse. Please advise.

## 2018-05-06 NOTE — Telephone Encounter (Signed)
Pt called and stated that she was in the ED and was in very bad pain and wanted to know if [facility] could send in some a different type of pain medication because the one she has is currently taking is not helping.  Message was sent to [facility].

## 2018-05-06 NOTE — Telephone Encounter (Signed)
Spoke with pt and she went to her PCP and they gave her some pain medication and she did an ultrasound there. Pt stated that she was doing a lot better now. Pt was informed that [facility] had sent her some medication in for pain.

## 2018-05-06 NOTE — ED Triage Notes (Signed)
Pt c/o RLQ pain that started this morning. States she had the same pain last week and was seen here given a shot and was fine until today. States she has a hx of uterine fibroids.

## 2018-05-06 NOTE — Telephone Encounter (Signed)
Patient called a second time and was extremely hysterical and stated she was currently in ED, The patient was requesting to speak with a nurse and stated she was in severe pain. I attempted to call back to reach a nurse several times and was unable to reach anyone. The patient hung up.

## 2018-05-11 ENCOUNTER — Encounter: Payer: BLUE CROSS/BLUE SHIELD | Admitting: Obstetrics and Gynecology

## 2018-05-11 DIAGNOSIS — K5900 Constipation, unspecified: Secondary | ICD-10-CM | POA: Insufficient documentation

## 2018-05-12 DIAGNOSIS — N179 Acute kidney failure, unspecified: Secondary | ICD-10-CM | POA: Insufficient documentation

## 2018-05-13 ENCOUNTER — Ambulatory Visit: Payer: Self-pay | Admitting: Obstetrics and Gynecology

## 2018-05-13 ENCOUNTER — Inpatient Hospital Stay: Admission: RE | Admit: 2018-05-13 | Payer: BLUE CROSS/BLUE SHIELD | Source: Ambulatory Visit

## 2018-05-13 ENCOUNTER — Other Ambulatory Visit: Payer: BLUE CROSS/BLUE SHIELD

## 2018-05-16 ENCOUNTER — Telehealth: Payer: Self-pay | Admitting: Obstetrics and Gynecology

## 2018-05-16 NOTE — Telephone Encounter (Signed)
We can wait until 3/12 as long as she understands that if her levels are too low for surgery that we may have to admit her to the hospital the evening before so that she can receive a blood transfusion, or the possibility may have to push the surgery date out.

## 2018-05-16 NOTE — Telephone Encounter (Signed)
The patient cancelled her labs on Friday, 05/13/18, and she is not sure if she needs to reschedule those here or just go for her admission testing on 05/19/18.  She is asking her nurse or provider to call her and let her know what she needs to do.  Please advise, thanks.

## 2018-05-17 NOTE — Telephone Encounter (Signed)
Spoke with pt and she made an appointment to pre-admission testing on 05/19/18 at 2pm.

## 2018-05-17 NOTE — Telephone Encounter (Signed)
Sent pt a mychart message. 

## 2018-05-19 ENCOUNTER — Telehealth: Payer: Self-pay | Admitting: Obstetrics and Gynecology

## 2018-05-19 ENCOUNTER — Encounter
Admission: RE | Admit: 2018-05-19 | Discharge: 2018-05-19 | Disposition: A | Discharge status: Home / Self Care | Payer: BLUE CROSS/BLUE SHIELD | Source: Ambulatory Visit | Attending: Obstetrics and Gynecology | Admitting: Obstetrics and Gynecology

## 2018-05-19 ENCOUNTER — Other Ambulatory Visit: Payer: Self-pay

## 2018-05-19 ENCOUNTER — Other Ambulatory Visit: Payer: Self-pay | Admitting: Obstetrics and Gynecology

## 2018-05-19 DIAGNOSIS — R011 Cardiac murmur, unspecified: Secondary | ICD-10-CM | POA: Insufficient documentation

## 2018-05-19 DIAGNOSIS — Z01818 Encounter for other preprocedural examination: Secondary | ICD-10-CM | POA: Insufficient documentation

## 2018-05-19 DIAGNOSIS — I10 Essential (primary) hypertension: Secondary | ICD-10-CM

## 2018-05-19 HISTORY — DX: Personal history of Methicillin resistant Staphylococcus aureus infection: Z86.14

## 2018-05-19 NOTE — Telephone Encounter (Signed)
Done

## 2018-05-19 NOTE — Telephone Encounter (Signed)
Melanie from pre-admit called and stated that the patient does not have any orders in. Please advise.

## 2018-05-19 NOTE — Patient Instructions (Signed)
Your procedure is scheduled on: Mon. 3/16 Report to Day Surgery. To find out your arrival time please call [phone] between 1PM - 3PM on Friday 3/13.  Remember: Instructions that are not followed completely may result in serious medical risk,  up to and including death, or upon the discretion of your surgeon and anesthesiologist your  surgery may need to be rescheduled.     _X__ 1. Do not eat food after midnight the night before your procedure.                 No gum chewing or hard candies. You may drink clear liquids up to 2 hours                 before you are scheduled to arrive for your surgery- DO not drink clear                 liquids within 2 hours of the start of your surgery.                 Clear Liquids include:  water, apple juice without pulp, clear carbohydrate                 drink such as Clearfast of Gatorade, Black Coffee or Tea (Do not add                 anything to coffee or tea).  __X__2.  On the morning of surgery brush your teeth with toothpaste and water, you                may rinse your mouth with mouthwash if you wish.  Do not swallow any toothpaste of mouthwash.     _X__ 3.  No Alcohol for 24 hours before or after surgery.   _X__ 4.  Do Not Smoke or use e-cigarettes For 24 Hours Prior to Your Surgery.                 Do not use any chewable tobacco products for at least 6 hours prior to                 surgery.  ____  5.  Bring all medications with you on the day of surgery if instructed.   _x___  6.  Notify your doctor if there is any change in your medical condition      (cold, fever, infections).     Do not wear jewelry, make-up, hairpins, clips or nail polish. Do not wear lotions, powders, or perfumes. You may wear deodorant. Do not shave 48 hours prior to surgery. Men may shave face and neck. Do not bring valuables to the hospital.    [facility] is not responsible for any belongings or valuables.  Contacts,  dentures or bridgework may not be worn into surgery. Leave your suitcase in the car. After surgery it may be brought to your room. For patients admitted to the hospital, discharge time is determined by your treatment team.   Patients discharged the day of surgery will not be allowed to drive home.   Please read over the following fact sheets that you were given:    _x___ Take these medicines the morning of surgery with A SIP OF WATER:    1. metoprolol tartrate (LOPRESSOR) 25 MG tablet  2.  3.   4.  5.  6.  ____ Fleet Enema (as directed)   __x__ Use CHG Soap as directed  ____ Use inhalers  on the day of surgery  ____ Stop metformin 2 days prior to surgery    ____ Take 1/2 of usual insulin dose the night before surgery. No insulin the morning          of surgery.   ____ Stop Coumadin/Plavix/aspirin on   _x___ Stop Anti-inflammatories  naproxen sodium (ANAPROX) 220 MG tablet or Ibuprofen.  May take tylenol   ____ Stop supplements until after surgery.    ____ Bring C-Pap to the hospital.

## 2018-05-19 NOTE — Pre-Procedure Instructions (Signed)
Patient denies any new cardiac symptoms. No chest pain or SOB.  Can walk up stairs without difficulty and walk 1 mile at her own pace without SOB.  See [facility] Cardiology note and evaluation on 06/07/17 Care Everywhere.

## 2018-05-19 NOTE — Pre-Procedure Instructions (Signed)
EKG results from [facility] on 06/12/17    Component Name Value     EKG Systolic BP    EKG Diastolic BP    EKG Ventricular Rate 64   EKG Atrial Rate 64   EKG P-R Interval 212   EKG QRS Duration 92   EKG Q-T Interval 424   EKG QTC Calculation 437   EKG Calculated P Axis 47   EKG Calculated R Axis 74   EKG Calculated T Axis -49   Result Narrative  SINUS RHYTHM WITH 1ST DEGREE AV BLOCK POSSIBLE LEFT ATRIAL ENLARGEMENT  SEPTAL INFARCT (CITED ON OR BEFORE 17-Oct-2014) ST & T WAVE ABNORMALITY, CONSIDER INFEROLATERAL ISCHEMIA ABNORMAL ECG WHEN COMPARED WITH ECG OF 22-Mar-2017 13:39, T WAVE INVERSION MORE EVIDENT IN INFERIOR LEADS Confirmed by Stephani Police [phone]) on 06/12/2017 10:53:07 AM  Other Result Information  Interface, Rad Results In - 06/12/2017 10:53 AM EDT SINUS RHYTHM WITH 1ST DEGREE AV BLOCK POSSIBLE LEFT ATRIAL ENLARGEMENT  SEPTAL INFARCT  (CITED ON OR BEFORE 17-Oct-2014) ST & T WAVE ABNORMALITY, CONSIDER INFEROLATERAL ISCHEMIA ABNORMAL ECG WHEN COMPARED WITH ECG OF 22-Mar-2017 13:39, T WAVE INVERSION MORE EVIDENT IN INFERIOR LEADS Confirmed by Stephani Police [phone]) on 06/12/2017 10:53:07 AM

## 2018-05-20 ENCOUNTER — Telehealth: Payer: Self-pay | Admitting: Obstetrics and Gynecology

## 2018-05-20 NOTE — Telephone Encounter (Signed)
The patient called today and stated her surgery had to be cancelled and we do not need to do her FMLA paperwork at this time.  She requested a refund, and the [facility] billing number was given to the patient.  *NO FURTHER ACTION REQUIRED AT THIS TIME*

## 2018-05-20 NOTE — Pre-Procedure Instructions (Signed)
Dr. Marcelline Mates notified per secure chat about anesthesiology decision to recommend the patient be cleared by cardiology with a stress test and that this surgery be done at a tertiary care hospital if she proceeds with this surgery.

## 2018-05-20 NOTE — Telephone Encounter (Signed)
Pt is not happy about her surgery being cancelled. She wants to know why she was not told this may happen to her before now.  She is in a lot of pain and now is having to go back to work. She was able to schedule an appt at [facility] on Monday 3/16 at 1pm.  Apologized to pt and informed her I would send a message to [facility]. It may be possible for [facility] to extend her time off work??   Pt appreciative of return call.   CM

## 2018-05-20 NOTE — Telephone Encounter (Signed)
LMTRC

## 2018-05-20 NOTE — Pre-Procedure Instructions (Signed)
Patient responded to voice mail and her questions were answered about the reason for the cancellation of the surgery. She was encouraged to make an appointment with her cardiologist for evaluation. Voices understanding.

## 2018-05-20 NOTE — Pre-Procedure Instructions (Signed)
Dr. Andreas Blower office notified that surgery should be cancelled for Monday 3/16.  They agreed to take care of this. A message was left on the patient's phone about the above decision.  She was encouraged to call the office or me with questions.

## 2018-05-20 NOTE — Telephone Encounter (Signed)
The patient called and stated that she would like to speak with a nurse. I informed the patient that a nurse will contact her right back, The patient stated "umm hmm" and hung up. Please advise.

## 2018-05-20 NOTE — Progress Notes (Signed)
Discussed upcoming surgery with Dr. Ronelle Nigh, who also consulted with other anesthesiologists.  Due to the history of idiopathic hypertrophic cardiomyopathy anesthesia feels that surgery should be performed in a tertiary hospital setting.  Also noted that her last cardiology appointment in April 2019 recommended follow up in 6 months with stress testing at that next visit, neither of which has been performed.  Dr. Marcelline Mates will be notified.

## 2018-05-23 ENCOUNTER — Encounter: Admission: RE | Payer: Self-pay | Source: Home / Self Care

## 2018-05-23 ENCOUNTER — Ambulatory Visit
Admission: RE | Admit: 2018-05-23 | Payer: BLUE CROSS/BLUE SHIELD | Source: Home / Self Care | Admitting: Obstetrics and Gynecology

## 2018-05-23 SURGERY — ROBOTIC ASSISTED MYOMECTOMY
Anesthesia: General

## 2018-05-23 NOTE — Telephone Encounter (Signed)
Attempted to contact patient regarding her concerns. Left voicemail for patient to return call.   Dr. Marcelline Mates

## 2018-05-30 NOTE — Telephone Encounter (Signed)
Spoke with pt to see how she was doing. Pt stated that she was currently in the hospital at [facility]. Pt stated that she was having her cyst drained. Pt stated that she just wanted to have surgery and not be in so much pain and having heavy cycles. Pt stated that she was very disappointment in how everything happen with the arrangements of her last scheduled surgery. Pt was informed that [facility] would be aware of her current situation.

## 2018-05-31 ENCOUNTER — Telehealth: Payer: Self-pay | Admitting: Obstetrics and Gynecology

## 2018-05-31 NOTE — Telephone Encounter (Signed)
Contacted patient as I received info from [facility] that patient was currently admitted.  She was admitted on Saturday, 05/28/18 due to continued episodes of pelvic pain, along with nausea/vomiting.  She reports that she was told that she had a large right adnexal cyst that was infected.  Underwent drainage  of the cyst yesterday (with drain still in place) and is currently on IV antibiotics. Has concerns as she still desires to have a myomectomy but is worried as the doctors at [facility] prefer for her to have a hysterectomy.  Discussed pros/cons with patient. Advised that if her clinical picture improves and she becomes stable, she has the option of having her myomectomy performed as outpatient, however will not be until at least May due to the COVID-19 situation. If she continues to become sick, she may need to reconsider as hysterectomy may at that time be the best option for her.  She reports that she has seen her Cardiologist (the reason her surgery was canceled at [facility] last week), who notes that she would be fine to have surgery and to discontinue her Spironolactone.   Advised patient to consider all options, and that we continue to be in touch. Encouraged patient to reach out with any further questions via Mychart or phone.    Rubie Maid, MD [facility]

## 2018-07-04 ENCOUNTER — Telehealth: Payer: Self-pay | Admitting: Obstetrics and Gynecology

## 2018-07-04 NOTE — Telephone Encounter (Signed)
Please inform patient that we will be working diligently to get her surgery scheduled once they allow [place] to resume and we have received her info from her Cardiologist, and if the Aygestin is not working for her anymore we can change to something different (provera 10 mg TID, a Depo Provera injection x 1, or a sample of the [place] which is a new medication that is receiving approval to be used with fibroids but may take 1-2 weeks to notice a change).

## 2018-07-04 NOTE — Telephone Encounter (Signed)
Patient called stating she is having issues with a medication that is supposed to help control her bleeding. She would like to speak with someone to determine wether or not she needs an appointment. Thanks

## 2018-07-04 NOTE — Telephone Encounter (Signed)
Which medication is she taking? The last time I prescribed her something was the Aygestin (norethindrone) some time ago.  Did she get something new with her recent hospitalization at [facility]?  What is the issue with the medication?

## 2018-07-04 NOTE — Telephone Encounter (Signed)
Spoke with pt and she stated that she was still taking the Aygestin 5mg  2 tablets once daily. Pt stated that she is still spotting heavily and just wants to stop bleeding all together. Pt stated that she tried of bleeding.

## 2018-07-05 NOTE — Telephone Encounter (Signed)
Lm for patient to return call.

## 2018-07-06 NOTE — Telephone Encounter (Signed)
Sent pt a Pharmacist, community message with the information given by [facility].

## 2018-07-08 ENCOUNTER — Telehealth: Payer: Self-pay

## 2018-07-08 NOTE — Telephone Encounter (Signed)
Pt called into the office requesting of release for her last medical notes. Pt was informed that she would need to come to the office and sign the form before we could fax it. Pt stated that she would come by the office today and sign the forms.

## 2018-07-11 DIAGNOSIS — Z01810 Encounter for preprocedural cardiovascular examination: Secondary | ICD-10-CM | POA: Insufficient documentation

## 2018-07-12 ENCOUNTER — Other Ambulatory Visit: Payer: Self-pay

## 2018-07-12 ENCOUNTER — Telehealth: Payer: Self-pay | Admitting: Obstetrics and Gynecology

## 2018-07-12 MED ORDER — NORETHIN ACE-ETH ESTRAD-FE 1.5-30 MG-MCG PO TABS: 1.0000 | ORAL_TABLET | Freq: Every day | ORAL | 11 refills | Status: DC

## 2018-07-12 NOTE — Telephone Encounter (Signed)
Pt was informed that due to COVID-19 that I work 2 days one week and 3 days the other and was not in the office on Monday when [facility] sent the message stated that it was okay for her to take Lo Fe. Pt stated that she understood, but was just upset due to all of the vaginal bleeding that she is having. Lo Fe was sent to the pt's pharmacy.

## 2018-07-12 NOTE — Telephone Encounter (Signed)
Patient called stating she has not heard back from the nurse. She stated she was supposed to get a medication to help with her bleeding. Patient was upset and hung up on me.

## 2018-08-05 ENCOUNTER — Encounter: Payer: Self-pay | Admitting: Obstetrics and Gynecology

## 2018-08-05 ENCOUNTER — Ambulatory Visit (INDEPENDENT_AMBULATORY_CARE_PROVIDER_SITE_OTHER): Payer: BLUE CROSS/BLUE SHIELD | Admitting: Obstetrics and Gynecology

## 2018-08-05 ENCOUNTER — Other Ambulatory Visit: Payer: Self-pay

## 2018-08-05 VITALS — BP 147/99 | HR 65 | Ht 62.0 in | Wt 184.4 lb

## 2018-08-05 DIAGNOSIS — D252 Subserosal leiomyoma of uterus: Secondary | ICD-10-CM

## 2018-08-05 DIAGNOSIS — I422 Other hypertrophic cardiomyopathy: Secondary | ICD-10-CM

## 2018-08-05 DIAGNOSIS — D251 Intramural leiomyoma of uterus: Secondary | ICD-10-CM | POA: Diagnosis not present

## 2018-08-05 DIAGNOSIS — I10 Essential (primary) hypertension: Secondary | ICD-10-CM

## 2018-08-05 DIAGNOSIS — D500 Iron deficiency anemia secondary to blood loss (chronic): Secondary | ICD-10-CM

## 2018-08-05 DIAGNOSIS — E6609 Other obesity due to excess calories: Secondary | ICD-10-CM

## 2018-08-05 DIAGNOSIS — R102 Pelvic and perineal pain: Secondary | ICD-10-CM | POA: Diagnosis not present

## 2018-08-05 DIAGNOSIS — Z6833 Body mass index (BMI) 33.0-33.9, adult: Secondary | ICD-10-CM

## 2018-08-05 DIAGNOSIS — N938 Other specified abnormal uterine and vaginal bleeding: Secondary | ICD-10-CM

## 2018-08-05 DIAGNOSIS — Z9889 Other specified postprocedural states: Secondary | ICD-10-CM | POA: Diagnosis not present

## 2018-08-05 DIAGNOSIS — Z8742 Personal history of other diseases of the female genital tract: Secondary | ICD-10-CM

## 2018-08-05 DIAGNOSIS — N7093 Salpingitis and oophoritis, unspecified: Secondary | ICD-10-CM

## 2018-08-05 DIAGNOSIS — K439 Ventral hernia without obstruction or gangrene: Secondary | ICD-10-CM

## 2018-08-05 MED ORDER — HYDROCODONE-ACETAMINOPHEN 5-325 MG PO TABS: 1.0000 | ORAL_TABLET | Freq: Four times a day (QID) | ORAL | 0 refills | Status: DC | PRN

## 2018-08-05 NOTE — Progress Notes (Signed)
GYNECOLOGY PROGRESS NOTE  Subjective:    Patient ID: Lynn Boyd, female    DOB: 1985/07/13, 33 y.o.   MRN: [id number]  HPI  Patient is a 33 y.o. G0P0000 female who presents for pre-operative examination.  She has a history of enlarged fibroid uterus, dysfunctional uterine bleeding, h/o endometriosis, and anemia (receiving iron infusions) s/p failed [place]ot Lupron therapy/Aygestin.  She was originally scheduled for robotic myomectomy in March, however surgery was canceled due to [place] pandemic and need for further cardiac evaluation.    She was also admitted to [facility] in late March to [facility] for fever and worsening pelvic pain, and was diagnosed with a large right tubo-ovarian abscess for which she was hospitalized for almost 1 week.  She underwent CT-guided drainage and was treated with broad spectrum antibiotics. She also experienced an ileus during her hospitalization and was noted to have dehydration and AKI.  Patient during that hospitalization had been counseled extensively several times that definitive surgical management with total hysterectomy, bilateral salpingo-oophorectomy would be the preferred treatment and to prevent recurrence of tubo-ovarian abscesses (patient has had 2 in the past 4 years, requiring hospitalizations and surgical drainage). The patient firmly declined surgical management throughout her hospitalization due to a desire to preserve fertility.   Patient is currently on Loestrin Fe due to ineffectiveness of Aygestin to help manage her bleeding. She notes that she is bleeding much less (lighter flow), but still has breakthrough bleeding several days even on the pills. Notes that her pain is only partly controlled by NSAIDs (currently taking Ibuprofen and Aleve).  She also feels that she may be growing or feeling another fibroid in her upper stomach.   The following portions of the patient's history were reviewed and updated as appropriate: allergies, current medications, past  family history, past medical history, past social history, past surgical history and problem list.  Review of Systems Pertinent items noted in HPI and remainder of comprehensive ROS otherwise negative.   Objective:   Blood pressure (!) 147/99, pulse 65, height 5\' 2"  (1.575 m), weight 184 lb 6.4 oz (83.6 kg). Body mass index is 33.73 kg/m.  General appearance: alert and no distress, mild obesity Abdomen: soft, mildly tender in lower abdomen.  A 2 x 3 cm supraumbilical hernia present, reducible, non-tender. Uterus palpable up to 14-15 cm.   Pelvic: deferred    Imaging:  - Overall stable appearance of left tuboovarian abscess. Slight interval  increase in fluid within the left fallopian tube status post drain  removal, now measuring up 1.5 cm. Interval decrease in residual fluid  within the right fallopian tube.  - New wedge-shaped hypodensity in the upper pole right kidney, concerning  for infarction versus infection. Previously noted hypodensities in the  mid/inferior right kidney are decreased in conspicuity.  - Right lower quadrant near-fluid attenuation collection is decreased in  size from prior, favor ascites.  - Enlarged fibroid uterus with slight increase in size of pedunculated  right lower quadrant fibroid.  - Continued decreased conspicuity of omental caking.   Other Result Information  This result has an attachment that is not available.  Result Narrative  EXAM: CT Abdomen Pelvis W Contrast DATE: 06/20/18 ACCESSION: [id number] UN DICTATED: 06/20/2018 1:52 PM INTERPRETATION LOCATION: [place]   CLINICAL INDICATION: 33 y.o. female with TOA (tubo-ovarian abscess)  [N70.93 (ICD-10-CM)] follow up tuboovarian abscesses  COMPARISON: CT abdomen pelvis 06/01/18 and earlier.  TECHNIQUE: A spiral CT scan was obtained with IV contrast from the lung  bases  to the pubic symphysis. Images were reconstructed in the axial  plane. Coronal and sagittal reformatted images  were also provided for  further evaluation.  FINDINGS:   LINES AND TUBES: Interval removal of left lower quadrant pigtail drain.  LOWER THORAX: Unremarkable.  HEPATOBILIARY: Hepatomegaly unchanged. No focal hepatic lesions. The  gallbladder is present with a prominent pharyngeal cap. Mild gallbladder  wall thickening is new from prior, likely reactive. No biliary dilatation.   SPLEEN: Unremarkable. PANCREAS: Unremarkable. ADRENALS: Unremarkable. KIDNEYS/URETERS: Symmetric nephrograms. Irregular focus of hypoattenuation  in the anterior upper pole of the right kidney is new from prior.  Previously noted wedge-shaped foci of hypoattenuation in the mid/inferior  right kidney are less conspicuous compared to prior. No hydronephrosis.  BLADDER: Compressed by enlarged fibroid uterus, limiting evaluation. PELVIC/REPRODUCTIVE ORGANS: Enlarged fibroid uterus is again noted similar  to prior. Slight interval increase in left hydrosalpinx status post drain  removal now measuring up to 1.5 cm (3:107). Similar appearance of 3.0 x  1.9 cm peripherally enhancing multiloculated collection in the left adnexa  concerning for tuboovarian abscess (3:99).  Questionable redemonstration of previously described fluid-filled tubular  structure in the right adnexa measuring up to 1.0 cm, previously 1.5 cm  (3:113) likely representing improving right hydrosalpinx.  Right lower quadrant 11.5 x 9.4 cm soft tissue mass previously measuring  10.0 x 9.4 cm (3:70), likely representing a uterine fibroid questionably  pedunculated; however, no definite stalk identified again.  Ovaries not visualized.  GI TRACT: Oral contrast opacifies the stomach, small bowel, and colon. No  bowel obstruction Lack of oral contrast limits evaluation. Scattered fecal  lysed loops of jejunum are again noted. There has been interval decrease  jejunal loop dilation.  PERITONEUM/RETROPERITONEUM AND MESENTERY: Diffuse  mesenteric edema is  slightly decreased. Interval decrease in omental caking. Near fluid  attenuation collection in the right lower quadrant measures 4.6 x 1.8 cm,  previously 6.0 x 2.5 cm, favored to represent ascites. Small volume pelvic  free fluid. No free intraperitoneal air.  LYMPH NODES: No enlarged lymph nodes. Subcentimeter retroperitoneal and  pelvic lymph nodes are decreased in prominence. VESSELS: The aorta is normal in caliber. No significant calcified  atherosclerotic disease. The portal venous system appears patent however  evaluation is limited. The hepatic veins and IVC are unremarkable.  BONES AND SOFT TISSUES: No acute osseous abnormalities. Unchanged  fat-containing umbilical hernia. Soft tissue stranding in the subcutaneous  fat of the anterior left lower quadrant at the site of prior pigtail  drain.    Ultrasound CLINICAL DATA:  Uterine fibroids, on Lupron  EXAM: TRANSABDOMINAL AND TRANSVAGINAL ULTRASOUND OF PELVIS  TECHNIQUE: Both transabdominal and transvaginal ultrasound examinations of the pelvis were performed. Transabdominal technique was performed for global imaging of the pelvis including uterus, ovaries, adnexal regions, and pelvic cul-de-sac. It was necessary to proceed with endovaginal exam following the transabdominal exam to visualize the endometrium.  COMPARISON:  10/03/2015  FINDINGS: Uterus  Measurements: 15.0 x 8.0 x 12.2 cm = volume: 770 mL. Enlarged and heterogeneous containing multiple leiomyomata. Measured lesions include a posterior upper uterine submucosal leiomyoma 5.6 x 5.4 x 5.7 cm, a subserosal upper uterine leiomyoma on RIGHT near fundus 3.9 x 3.8 x 4.0 cm, and a submucosal central leiomyoma 4.8 x 4.7 x 4.2 cm. Additional smaller lesions are also identified.  Endometrium  Thickness: Obscured by multiple uterine masses unable to assess.  Right ovary  Measurements: 5.9 x 3.0 x 4.9 cm = volume: 44.5 mL.  Dominant cyst with  scattered low level internal echoes, lesion measuring 5.0 x 3.0 x 3.2 cm.  Left ovary  Measurements: 4.7 x 4.0 x 3.4 cm = volume: 32.9 mL. Normal morphology without mass  Other findings  No free pelvic fluid.  No additional adnexal masses.  IMPRESSION: Enlarged uterus containing multiple leiomyomata several of which are submucosal.  Endometrial complex is obscured, unable to assess thickness.  5.0 cm greatest diameter RIGHT ovarian cyst complicated by scattered low level internal echogenicity; this has significantly decreased in size since a RIGHT ovarian/adnexal cystic lesion of 10/03/2015 question hemorrhagic cyst versus endometrioma.   Electronically Signed  By: Lavonia Dana M.D.  On: 04/15/2018 16:25    Labs:  06/14/2018 8.1>8.3/26.7<320  Assessment:   Fibroid uterus Pelvic pain DUB Prior history of myomectomy History of anemia History of endometriosis History of tubo-ovarian abscess Hypertension H/o cardiac enlargement  Ventral hernia  Plan:    Patient continues to desire surgical management with myomectomy (robotic).  The risks of surgery were discussed in detail with the patient including but not limited to: bleeding which may require transfusion or reoperation; infection which may require prolonged hospitalization or re-hospitalization and antibiotic therapy; injury to bowel, bladder, ureters and major vessels or other surrounding organs; need for additional procedures including laparotomy; thromboembolic phenomenon, incisional problems and other postoperative or anesthesia complications.  Patient was told that the likelihood that her condition and symptoms will be treated effectively with this surgical management was very high; the postoperative expectations were also discussed in detail. The patient also understands the alternative treatment options which were discussed in full. All questions were answered.   - Patient requests  something for pain. She is currently alternating with Aleve and Ibuprofen. Advised not to take both as they are both NSAIDs, but should chose one.  Has taken both Toradol and Tramadol in the past which helped initially, but have recently become less effective.  Will prescribe Vicodin.  - History of anemia, recently had iron infusion, awaiting on labs .  - Patient has been evaluated by her Cardiologist earlier this month.  Notes that patient is moderate risk for a moderate-risk surgery.  Recent Echo notes LVH, with EF of 55%. Will inform Anesthesiology of findings.  - Encouraged patient to strongly consider removal of right adnexa to prevent further TOA's. Can also consider chromotubation to assess patency of other fallopian tube. - Omental caking noted on recent CT scan. Unclear if due to recent inflammation due to recent significant illness with TOA, or due to prior history of endometriosis, or concern for malignancy (however a recent CA-125 was normal).  Will likely need to consider intraoperative consultation with either General Surgery or GYN Oncology.   - Ventral hernia present, patient notes that it is not very bothersome. Offered to have patient referred to General Surgery for possible repair, however patient declines at this time.  - HTN better controlled now that medications have been adjusted.  - Continue management of bleeding with Lo-Estrin for now since BPs are better controlled. Failed management with Lupron and Aygestin.    A total of 25 minutes were spent face-to-face with the patient during this encounter and over half of that time dealt with review of records, counseling, and coordination of care.   Rubie Maid, MD [facility]

## 2018-08-05 NOTE — Patient Instructions (Signed)
Myomectomy    Myomectomy is a surgery in which a non-cancerous fibroid (myoma) is removed from the uterus. Myomas are tumors made up of fibrous tissue. They are often called fibroid tumors. Fibroid tumors can range from the size of a pea to the size of a grapefruit. In a myomectomy, the fibroid tumor is removed without removing the uterus. Because these tumors are rarely cancerous, this surgery is usually done only if the tumor is growing or causing symptoms such as pain, pressure, bleeding, or pain with intercourse.  Tell a health care provider about:  · Any allergies you have.  · All medicines you are taking, including vitamins, herbs, eye drops, creams, and over-the-counter medicines.  · Any problems you or family members have had with anesthetic medicines.  · Any blood disorders you have.  · Any surgeries you have had.  · Any medical conditions you have.  What are the risks?  Generally, this is a safe procedure. However, problems may occur, including:  · Bleeding.  · Infection.  · Allergic reactions to medicines.  · Damage to other structures or organs.  · Blood clots in the legs, chest, or brain.  · Scar tissue on other organs and in the pelvis. This may require another surgery to remove the scar tissue.  What happens before the procedure?  Staying hydrated  Follow instructions from your health care provider about hydration, which may include:  · Up to 2 hours before the procedure - you may continue to drink clear liquids, such as water, clear fruit juice, black coffee, and plain tea.  Eating and drinking restrictions  Follow instructions from your health care provider about eating and drinking, which may include:  · 8 hours before the procedure - stop eating heavy meals or foods such as meat, fried foods, or fatty foods.  · 6 hours before the procedure - stop eating light meals or foods, such as toast or cereal.  · 6 hours before the procedure - stop drinking milk or drinks that contain milk.  · 2 hours before  the procedure - stop drinking clear liquids.  General instructions  · Ask your health care provider about:  ? Changing or stopping your regular medicines. This is especially important if you are taking diabetes medicines or blood thinners.  ? Taking medicines such as aspirin and ibuprofen. These medicines can thin your blood. Do not take these medicines before your procedure if your health care provider instructs you not to.  · Do not drink alcohol the day before the surgery.  · Do not use any products that contain nicotine or tobacco, such as cigarettes and e-cigarettes, for 2 weeks before the procedure. If you need help quitting, ask your health care provider.  · Plan to have someone take you home from the hospital or clinic. Also arrange for someone to help you with activities during your recovery.  What happens during the procedure?  · To reduce your risk of infection:  ? Your health care team will wash or sanitize their hands.  ? Your skin will be washed with soap.  ? Hair may be removed from the surgical area.  · An IV tube will be inserted into one of your veins. Medicines will be able to flow directly into your body through this IV tube.  · You will be given one or more of the following:  ? A medicine to help you relax (sedative).  ? A medicine to make you fall asleep (general anesthetic).  ·   Small monitors will be attached to your body. They will be used to check your heart, blood pressure, and oxygen level.  · A breathing tube will be placed into your lungs during the procedure.  · A thin, flexible tube (catheter) will be inserted into your bladder to collect urine.  · Your surgeon will use one of the following methods to perform the procedure. The method used will depend on the size, shape, location, and number of fibroids.  Hysteroscopic myomectomy  This method may be used when the fibroid tumor is inside the cavity of the uterus. A long, thin tube with a lens (hysteroscope) will be inserted into the  uterus through the vagina. A saline solution will be put into the uterus. This will expand the uterus and allow the surgeon to see the fibroids. Tools will be passed through the hysteroscope to remove the fibroid tumor in pieces.  Laparoscopic myomectomy  A few small incisions will be made in the lower abdomen. A thin, lighted tube with a camera (laparoscope) will be inserted through one of the incisions. This will give the surgeon a good view of the area. The fibroid tumor will be removed through the other incisions. The incisions will then be closed with stitches (sutures) or staples.  Abdominal myomectomy  This method is used when the fibroid tumor cannot be removed with a hysteroscope or laparoscope. The surgery will be done through a larger surgical incision in the abdomen. The fibroid tumor will be removed through this incision. The incision will be closed with sutures or staples. Recovery time will be longer if this method is used.  The procedures may vary among health care providers and hospitals.  What happens after the procedure?  · Your blood pressure, heart rate, breathing rate, and blood oxygen level will be monitored until the medicines you were given have worn off.  · The IV access tube and catheter will remain on your body for a period of time.  · You may be given medicine for pain or to help you sleep.  · You may be given an antibiotic medicine if needed.  · Do not drive for 24 hours if you were given a sedative.  Summary  · Myomectomy is surgery to remove a noncancerous fibroid (myoma) from the uterus.  · This surgery is usually done only if the tumor is growing or causing symptoms such as pain, pressure, bleeding, or pain during intercourse.  · Follow instructions from your health care provider about eating and drinking before the procedure.  · Recovery time from this procedure depends on the method. The abdominal method will require a longer recovery time.  This information is not intended to  replace advice given to you by your health care provider. Make sure you discuss any questions you have with your health care provider.  Document Released: 12/21/2006 Document Revised: 03/26/2016 Document Reviewed: 03/26/2016  [employer]vier Interactive Patient Education © 2019 [employer]

## 2018-08-05 NOTE — Progress Notes (Signed)
Pt is present today for pre-op for fibroid surgery. Pt stated that she is still bleeding continually. Pt's LMP is unknown due to bleeding.

## 2018-08-06 DIAGNOSIS — Z9889 Other specified postprocedural states: Secondary | ICD-10-CM | POA: Insufficient documentation

## 2018-08-06 DIAGNOSIS — N938 Other specified abnormal uterine and vaginal bleeding: Secondary | ICD-10-CM | POA: Insufficient documentation

## 2018-08-06 DIAGNOSIS — R102 Pelvic and perineal pain: Secondary | ICD-10-CM | POA: Insufficient documentation

## 2018-08-06 DIAGNOSIS — Z8742 Personal history of other diseases of the female genital tract: Secondary | ICD-10-CM | POA: Insufficient documentation

## 2018-08-06 DIAGNOSIS — K439 Ventral hernia without obstruction or gangrene: Secondary | ICD-10-CM | POA: Insufficient documentation

## 2018-08-06 NOTE — H&P (View-Only) (Signed)
GYNECOLOGY PREOPERATIVE HISTORY AND PHYSICAL   Subjective:  Lynn Boyd is a 33 y.o. G0P0000 here for surgical management of enlarged fibroid uterus, dysfunctional uterine bleeding, h/o endometriosis, and anemia (receiving iron infusions) s/p failed Depot Lupron therapy/Aygestin.    She was also admitted to [facility] in late March to [facility] for fever and worsening pelvic pain, and was diagnosed with a large right tubo-ovarian abscess for which she was hospitalized for almost 1 week.  She underwent CT-guided drainage and was treated with broad spectrum antibiotics. She also experienced an ileus during her hospitalization and was noted to have dehydration and AKI.  Patient during that hospitalization had been counseled extensively several times that definitive surgical management with total hysterectomy, bilateral salpingo-oophorectomy would be the preferred treatment and to prevent recurrence of tubo-ovarian abscesses (patient has had 2 in the past 4 years, requiring hospitalizations and surgical drainage). The patient firmly declined surgical management throughout her hospitalization due to a desire to preserve fertility.   Patient is currently on Loestrin Fe due to ineffectiveness of Aygestin to help manage her bleeding. She notes that she is bleeding much less (lighter flow), but still has breakthrough bleeding several days even on the pills. Notes that her pain is only partly controlled by NSAIDs (currently taking Ibuprofen and Aleve). She also feels that she may be growing or feeling another fibroid in her upper stomach.   Significant preoperative concerns include hypertrophic cardiomyopathy (stable based on last Cardiology appointment in May, with EF of 55%), obstructive sleep apnea, mild obesity, and HTN.  Proposed surgery: Robotic myomectomy with possible chromotubation, possible right salpingectomy.     Pertinent Gynecological History: Menses: Prior to medications, flow was excessive, with  heavy bleeding leading to clothing accidents, lasting for 5-6 days.   Currently now steady light flow daily.  Bleeding: dysfunctional uterine bleeding Contraception: OCP (estrogen/progesterone) Last pap: normal Date: 07/09/2017   Past Medical History:  Diagnosis Date  . Chronic blood loss anemia   . Endometriosis   . Enlarged heart   . Fibroid uterus   . Heart murmur   . Herpes simplex virus (HSV) infection   . History of blood transfusion 2016   2 units, given for chronic blood loss anemia   . Hx MRSA infection    wound that required a drain  . Hypertension   . TOA (tubo-ovarian abscess) 11/28/2014  . Vaginal Pap smear, abnormal 2017   ASCUS with HPV+    Past Surgical History:  Procedure Laterality Date  . endometreoisis     surgery  . IR IMAGE GUIDED DRAINAGE BY PERCUTANEOUS CATHETER  2016   left tubo-ovarian abscess, performed at [place]  05/2010   [facility]    OB History  Gravida Para Term Preterm AB Living  0 0 0 0 0 0  SAB TAB Ectopic Multiple Live Births  0 0 0 0 0    Family History  Problem Relation Age of Onset  . Hypertension Mother   . Hypertension Father     Social History   Socioeconomic History  . Marital status: Single    Spouse name: Not on file  . Number of children: Not on file  . Years of education: Not on file  . Highest education level: Not on file  Occupational History  . Occupation: Training and development officer: [employer]  . Financial resource strain: Not on file  . Food insecurity:    Worry:  Not on file    Inability: Not on file  . Transportation needs:    Medical: Not on file    Non-medical: Not on file  Tobacco Use  . Smoking status: Never Smoker  . Smokeless tobacco: Never Used  Substance and Sexual Activity  . Alcohol use: Not Currently    Alcohol/week: 0.0 standard drinks    Comment: socially  . Drug use: No  . Sexual activity: Yes    Birth control/protection: None   Lifestyle  . Physical activity:    Days per week: Not on file    Minutes per session: Not on file  . Stress: Not on file  Relationships  . Social connections:    Talks on phone: Not on file    Gets together: Not on file    Attends religious service: Not on file    Active member of club or organization: Not on file    Attends meetings of clubs or organizations: Not on file    Relationship status: Not on file  . Intimate partner violence:    Fear of current or ex partner: Not on file    Emotionally abused: Not on file    Physically abused: Not on file    Forced sexual activity: Not on file  Other Topics Concern  . Not on file  Social History Narrative  . Not on file    Current Outpatient Medications on File Prior to Visit  Medication Sig Dispense Refill  . acetaminophen (TYLENOL) 500 MG tablet Take 1-2 tablets by mouth every 8 (eight) hours as needed for moderate pain.     Marland Kitchen azelastine (ASTELIN) 0.1 % nasal spray Place 1 spray into both nostrils daily as needed for rhinitis. Use in each nostril as directed    . cholecalciferol (VITAMIN D3) 25 MCG (1000 UT) tablet Take 400 Units by mouth daily.    . clindamycin-benzoyl peroxide (BENZACLIN) gel Apply 1 application topically every morning.    . metoprolol tartrate (LOPRESSOR) 50 MG tablet Take 50 mg by mouth daily.    . naproxen sodium (ANAPROX) 220 MG tablet Take 440 mg by mouth every 8 (eight) hours as needed (menstrual cramps).    . norethindrone-ethinyl estradiol-iron (LOESTRIN FE 1.5/30) 1.5-30 MG-MCG tablet Take 1 tablet by mouth daily. 1 Package 11  . spironolactone (ALDACTONE) 25 MG tablet Take 50 mg by mouth 2 (two) times daily.     Marland Kitchen tretinoin (RETIN-A) 0.025 % cream Apply 1 application topically at bedtime.     . valACYclovir (VALTREX) 500 MG tablet Take 500 mg by mouth 2 (two) times daily as needed (flare-ups).    . ferrous sulfate 325 (65 FE) MG EC tablet Take 325 mg by mouth daily with breakfast.      No current  facility-administered medications on file prior to visit.     Allergies  Allergen Reactions  . Bactrim [Sulfamethoxazole-Trimethoprim] Swelling  . Latex Rash     Review of Systems Constitutional: No recent fever/chills/sweats Respiratory: No recent cough/bronchitis Cardiovascular: No chest pain Gastrointestinal: No recent nausea/vomiting/diarrhea Genitourinary: No UTI symptoms Hematologic/lymphatic:No history of coagulopathy or recent blood thinner use    Objective:   Blood pressure (!) 147/99, pulse 65, height 5\' 2"  (1.575 m), weight 184 lb 6.4 oz (83.6 kg). CONSTITUTIONAL: Well-developed, well-nourished female in no acute distress.  HENT:  Normocephalic, atraumatic, External right and left ear normal. Oropharynx is clear and moist EYES: Conjunctivae and EOM are normal. Pupils are equal, round, and reactive to light. No scleral  icterus.  NECK: Normal range of motion, supple, no masses SKIN: Skin is warm and dry. No rash noted. Not diaphoretic. No erythema. No pallor. NEUROLOGIC: Alert and oriented to person, place, and time. Normal reflexes, muscle tone coordination. No cranial nerve deficit noted. PSYCHIATRIC: Normal mood and affect. Normal behavior. Normal judgment and thought content. CARDIOVASCULAR: Normal heart rate noted, regular rhythm RESPIRATORY: Effort and breath sounds normal, no problems with respiration noted ABDOMEN:  Soft, mildly tender in lower abdomen.  A 2 x 3 cm supraumbilical hernia present, reducible, non-tender. Uterus palpable up to 14-15 cm.  PELVIC: Deferred MUSCULOSKELETAL: Normal range of motion. No edema and no tenderness. 2+ distal pulses.    Imaging:  - Overall stable appearance of left tuboovarian abscess. Slight interval  increase in fluid within the left fallopian tube status post drain  removal, now measuring up 1.5 cm. Interval decrease in residual fluid  within the right fallopian tube.  - New wedge-shaped hypodensity in the upper pole  right kidney, concerning  for infarction versus infection. Previously noted hypodensities in the  mid/inferior right kidney are decreased in conspicuity.  - Right lower quadrant near-fluid attenuation collection is decreased in  size from prior, favor ascites.  - Enlarged fibroid uterus with slight increase in size of pedunculated  right lower quadrant fibroid.  - Continued decreased conspicuity of omental caking.   Other Result Information  This result has an attachment that is not available.  Result Narrative  EXAM: CT Abdomen Pelvis W Contrast DATE: 06/20/18 ACCESSION: [id number] UN DICTATED: 06/20/2018 1:52 PM INTERPRETATION LOCATION: [place]   CLINICAL INDICATION: 33 y.o. female with TOA (tubo-ovarian abscess)  [N70.93 (ICD-10-CM)] follow up tuboovarian abscesses  COMPARISON: CT abdomen pelvis 06/01/18 and earlier.  TECHNIQUE: A spiral CT scan was obtained with IV contrast from the lung  bases to the pubic symphysis. Images were reconstructed in the axial  plane. Coronal and sagittal reformatted images were also provided for  further evaluation.  FINDINGS:   LINES AND TUBES: Interval removal of left lower quadrant pigtail drain.  LOWER THORAX: Unremarkable.  HEPATOBILIARY: Hepatomegaly unchanged. No focal hepatic lesions. The  gallbladder is present with a prominent pharyngeal cap. Mild gallbladder  wall thickening is new from prior, likely reactive. No biliary dilatation.   SPLEEN: Unremarkable. PANCREAS: Unremarkable. ADRENALS: Unremarkable. KIDNEYS/URETERS: Symmetric nephrograms. Irregular focus of hypoattenuation  in the anterior upper pole of the right kidney is new from prior.  Previously noted wedge-shaped foci of hypoattenuation in the mid/inferior  right kidney are less conspicuous compared to prior. No hydronephrosis.  BLADDER: Compressed by enlarged fibroid uterus, limiting evaluation. PELVIC/REPRODUCTIVE ORGANS: Enlarged fibroid uterus is  again noted similar  to prior. Slight interval increase in left hydrosalpinx status post drain  removal now measuring up to 1.5 cm (3:107). Similar appearance of 3.0 x  1.9 cm peripherally enhancing multiloculated collection in the left adnexa  concerning for tuboovarian abscess (3:99).  Questionable redemonstration of previously described fluid-filled tubular  structure in the right adnexa measuring up to 1.0 cm, previously 1.5 cm  (3:113) likely representing improving right hydrosalpinx.  Right lower quadrant 11.5 x 9.4 cm soft tissue mass previously measuring  10.0 x 9.4 cm (3:70), likely representing a uterine fibroid questionably  pedunculated; however, no definite stalk identified again.  Ovaries not visualized.  GI TRACT: Oral contrast opacifies the stomach, small bowel, and colon. No  bowel obstruction Lack of oral contrast limits evaluation. Scattered fecal  lysed loops of jejunum are again noted. There has been  interval decrease  jejunal loop dilation.  PERITONEUM/RETROPERITONEUM AND MESENTERY: Diffuse mesenteric edema is  slightly decreased. Interval decrease in omental caking. Near fluid  attenuation collection in the right lower quadrant measures 4.6 x 1.8 cm,  previously 6.0 x 2.5 cm, favored to represent ascites. Small volume pelvic  free fluid. No free intraperitoneal air.  LYMPH NODES: No enlarged lymph nodes. Subcentimeter retroperitoneal and  pelvic lymph nodes are decreased in prominence. VESSELS: The aorta is normal in caliber. No significant calcified  atherosclerotic disease. The portal venous system appears patent however  evaluation is limited. The hepatic veins and IVC are unremarkable.  BONES AND SOFT TISSUES: No acute osseous abnormalities. Unchanged  fat-containing umbilical hernia. Soft tissue stranding in the subcutaneous  fat of the anterior left lower quadrant at the site of prior pigtail  drain.    Ultrasound CLINICAL DATA: Uterine  fibroids, on Lupron  EXAM: TRANSABDOMINAL AND TRANSVAGINAL ULTRASOUND OF PELVIS  TECHNIQUE: Both transabdominal and transvaginal ultrasound examinations of the pelvis were performed. Transabdominal technique was performed for global imaging of the pelvis including uterus, ovaries, adnexal regions, and pelvic cul-de-sac. It was necessary to proceed with endovaginal exam following the transabdominal exam to visualize the endometrium.  COMPARISON: 10/03/2015  FINDINGS: Uterus  Measurements: 15.0 x 8.0 x 12.2 cm = volume: 770 mL. Enlarged and heterogeneous containing multiple leiomyomata. Measured lesions include a posterior upper uterine submucosal leiomyoma 5.6 x 5.4 x 5.7 cm, a subserosal upper uterine leiomyoma on RIGHT near fundus 3.9 x 3.8 x 4.0 cm, and a submucosal central leiomyoma 4.8 x 4.7 x 4.2 cm. Additional smaller lesions are also identified.  Endometrium  Thickness: Obscured by multiple uterine masses unable to assess.  Right ovary  Measurements: 5.9 x 3.0 x 4.9 cm = volume: 44.5 mL. Dominant cyst with scattered low level internal echoes, lesion measuring 5.0 x 3.0 x 3.2 cm.  Left ovary  Measurements: 4.7 x 4.0 x 3.4 cm = volume: 32.9 mL. Normal morphology without mass  Other findings  No free pelvic fluid. No additional adnexal masses.  IMPRESSION: Enlarged uterus containing multiple leiomyomata several of which are submucosal.  Endometrial complex is obscured, unable to assess thickness.  5.0 cm greatest diameter RIGHT ovarian cyst complicated by scattered low level internal echogenicity; this has significantly decreased in size since a RIGHT ovarian/adnexal cystic lesion of 10/03/2015 question hemorrhagic cyst versus endometrioma.   Electronically Signed By: Lavonia Dana M.D. On: 04/15/2018 16:25    Labs:  06/14/2018 8.1>8.3/26.7<320   Assessment:   Fibroid uterus Pelvic pain DUB Prior history of myomectomy  History of anemia History of endometriosis History of tubo-ovarian abscess Hypertension H/o cardiac enlargement  Ventral hernia  Plan:   - Patient continues to desire surgical management with myomectomy (robotic). The risks of surgery were discussed in detail with the patient including but not limited to: bleeding which may require transfusion or reoperation; infection which may require prolonged hospitalization or re-hospitalization and antibiotic therapy; injury to bowel, bladder, ureters and major vessels or other surrounding organs; need for additional procedures including laparotomy; thromboembolic phenomenon, incisional problems and other postoperative or anesthesia complications. Patient was told that the likelihood that her condition and symptoms will be treated effectively with this surgical management was very high; the postoperative expectations were also discussed in detail. The patient also understands the alternative treatment options which were discussed in full. All questions were answered.  - Patient requests something for pain. She is currently alternating with Aleve and Ibuprofen. Advised not to take  both as they are both NSAIDs, but should chose one.  Has taken both Toradol and Tramadol in the past which helped initially, but have recently become less effective.  Will prescribe Vicodin.  - History of anemia, recently had iron infusion, awaiting on labs .  - Patient has been evaluated by her Cardiologist earlier this month.  Notes that patient is moderate risk for a moderate-risk surgery.  Recent Echo notes LVH, with EF of 55%. Will inform Anesthesiology of findings.  - Encouraged patient to strongly consider removal of right adnexa to prevent further TOA's. Can also consider chromotubation to assess patency of other fallopian tube. - Omental caking noted on recent CT scan. Unclear if due to recent inflammation due to recent significant illness with TOA, or due to prior history  of endometriosis, or concern for malignancy (however a recent CA-125 was normal).  Will likely need to consider intraoperative consultation with either General Surgery or GYN Oncology.   - Ventral hernia present, patient notes that it is not very bothersome. Offered to have patient referred to General Surgery for possible repair, however patient declines at this time.  - HTN better controlled now that medications have been adjusted.  - Continue management of bleeding with Lo-Estrin for now since BPs are better controlled. Failed management with Lupron and Aygestin.  - Instructions reviewed, including NPO after midnight.      Rubie Maid, MD [facility]

## 2018-08-06 NOTE — H&P (Signed)
GYNECOLOGY PREOPERATIVE HISTORY AND PHYSICAL   Subjective:  Rosaly Labarbera is a 33 y.o. G0P0000 here for surgical management of enlarged fibroid uterus, dysfunctional uterine bleeding, h/o endometriosis, and anemia (receiving iron infusions) s/p failed Depot Lupron therapy/Aygestin.    She was also admitted to [facility] in late March to [facility] for fever and worsening pelvic pain, and was diagnosed with a large right tubo-ovarian abscess for which she was hospitalized for almost 1 week.  She underwent CT-guided drainage and was treated with broad spectrum antibiotics. She also experienced an ileus during her hospitalization and was noted to have dehydration and AKI.  Patient during that hospitalization had been counseled extensively several times that definitive surgical management with total hysterectomy, bilateral salpingo-oophorectomy would be the preferred treatment and to prevent recurrence of tubo-ovarian abscesses (patient has had 2 in the past 4 years, requiring hospitalizations and surgical drainage). The patient firmly declined surgical management throughout her hospitalization due to a desire to preserve fertility.   Patient is currently on Loestrin Fe due to ineffectiveness of Aygestin to help manage her bleeding. She notes that she is bleeding much less (lighter flow), but still has breakthrough bleeding several days even on the pills. Notes that her pain is only partly controlled by NSAIDs (currently taking Ibuprofen and Aleve). She also feels that she may be growing or feeling another fibroid in her upper stomach.   Significant preoperative concerns include hypertrophic cardiomyopathy (stable based on last Cardiology appointment in May, with EF of 55%), obstructive sleep apnea, mild obesity, and HTN.  Proposed surgery: Robotic myomectomy with possible chromotubation, possible right salpingectomy.     Pertinent Gynecological History: Menses: Prior to medications, flow was excessive, with  heavy bleeding leading to clothing accidents, lasting for 5-6 days.   Currently now steady light flow daily.  Bleeding: dysfunctional uterine bleeding Contraception: OCP (estrogen/progesterone) Last pap: normal Date: 07/09/2017   Past Medical History:  Diagnosis Date  . Chronic blood loss anemia   . Endometriosis   . Enlarged heart   . Fibroid uterus   . Heart murmur   . Herpes simplex virus (HSV) infection   . History of blood transfusion 2016   2 units, given for chronic blood loss anemia   . Hx MRSA infection    wound that required a drain  . Hypertension   . TOA (tubo-ovarian abscess) 11/28/2014  . Vaginal Pap smear, abnormal 2017   ASCUS with HPV+    Past Surgical History:  Procedure Laterality Date  . endometreoisis     surgery  . IR IMAGE GUIDED DRAINAGE BY PERCUTANEOUS CATHETER  2016   left tubo-ovarian abscess, performed at [place]  05/2010   [facility]    OB History  Gravida Para Term Preterm AB Living  0 0 0 0 0 0  SAB TAB Ectopic Multiple Live Births  0 0 0 0 0    Family History  Problem Relation Age of Onset  . Hypertension Mother   . Hypertension Father     Social History   Socioeconomic History  . Marital status: Single    Spouse name: Not on file  . Number of children: Not on file  . Years of education: Not on file  . Highest education level: Not on file  Occupational History  . Occupation: Training and development officer: [employer]  . Financial resource strain: Not on file  . Food insecurity:    Worry:  Not on file    Inability: Not on file  . Transportation needs:    Medical: Not on file    Non-medical: Not on file  Tobacco Use  . Smoking status: Never Smoker  . Smokeless tobacco: Never Used  Substance and Sexual Activity  . Alcohol use: Not Currently    Alcohol/week: 0.0 standard drinks    Comment: socially  . Drug use: No  . Sexual activity: Yes    Birth control/protection: None   Lifestyle  . Physical activity:    Days per week: Not on file    Minutes per session: Not on file  . Stress: Not on file  Relationships  . Social connections:    Talks on phone: Not on file    Gets together: Not on file    Attends religious service: Not on file    Active member of club or organization: Not on file    Attends meetings of clubs or organizations: Not on file    Relationship status: Not on file  . Intimate partner violence:    Fear of current or ex partner: Not on file    Emotionally abused: Not on file    Physically abused: Not on file    Forced sexual activity: Not on file  Other Topics Concern  . Not on file  Social History Narrative  . Not on file    Current Outpatient Medications on File Prior to Visit  Medication Sig Dispense Refill  . acetaminophen (TYLENOL) 500 MG tablet Take 1-2 tablets by mouth every 8 (eight) hours as needed for moderate pain.     Marland Kitchen azelastine (ASTELIN) 0.1 % nasal spray Place 1 spray into both nostrils daily as needed for rhinitis. Use in each nostril as directed    . cholecalciferol (VITAMIN D3) 25 MCG (1000 UT) tablet Take 400 Units by mouth daily.    . clindamycin-benzoyl peroxide (BENZACLIN) gel Apply 1 application topically every morning.    . metoprolol tartrate (LOPRESSOR) 50 MG tablet Take 50 mg by mouth daily.    . naproxen sodium (ANAPROX) 220 MG tablet Take 440 mg by mouth every 8 (eight) hours as needed (menstrual cramps).    . norethindrone-ethinyl estradiol-iron (LOESTRIN FE 1.5/30) 1.5-30 MG-MCG tablet Take 1 tablet by mouth daily. 1 Package 11  . spironolactone (ALDACTONE) 25 MG tablet Take 50 mg by mouth 2 (two) times daily.     Marland Kitchen tretinoin (RETIN-A) 0.025 % cream Apply 1 application topically at bedtime.     . valACYclovir (VALTREX) 500 MG tablet Take 500 mg by mouth 2 (two) times daily as needed (flare-ups).    . ferrous sulfate 325 (65 FE) MG EC tablet Take 325 mg by mouth daily with breakfast.      No current  facility-administered medications on file prior to visit.     Allergies  Allergen Reactions  . Bactrim [Sulfamethoxazole-Trimethoprim] Swelling  . Latex Rash     Review of Systems Constitutional: No recent fever/chills/sweats Respiratory: No recent cough/bronchitis Cardiovascular: No chest pain Gastrointestinal: No recent nausea/vomiting/diarrhea Genitourinary: No UTI symptoms Hematologic/lymphatic:No history of coagulopathy or recent blood thinner use    Objective:   Blood pressure (!) 147/99, pulse 65, height 5\' 2"  (1.575 m), weight 184 lb 6.4 oz (83.6 kg). CONSTITUTIONAL: Well-developed, well-nourished female in no acute distress.  HENT:  Normocephalic, atraumatic, External right and left ear normal. Oropharynx is clear and moist EYES: Conjunctivae and EOM are normal. Pupils are equal, round, and reactive to light. No scleral  icterus.  NECK: Normal range of motion, supple, no masses SKIN: Skin is warm and dry. No rash noted. Not diaphoretic. No erythema. No pallor. NEUROLOGIC: Alert and oriented to person, place, and time. Normal reflexes, muscle tone coordination. No cranial nerve deficit noted. PSYCHIATRIC: Normal mood and affect. Normal behavior. Normal judgment and thought content. CARDIOVASCULAR: Normal heart rate noted, regular rhythm RESPIRATORY: Effort and breath sounds normal, no problems with respiration noted ABDOMEN:  Soft, mildly tender in lower abdomen.  A 2 x 3 cm supraumbilical hernia present, reducible, non-tender. Uterus palpable up to 14-15 cm.  PELVIC: Deferred MUSCULOSKELETAL: Normal range of motion. No edema and no tenderness. 2+ distal pulses.    Imaging:  - Overall stable appearance of left tuboovarian abscess. Slight interval  increase in fluid within the left fallopian tube status post drain  removal, now measuring up 1.5 cm. Interval decrease in residual fluid  within the right fallopian tube.  - New wedge-shaped hypodensity in the upper pole  right kidney, concerning  for infarction versus infection. Previously noted hypodensities in the  mid/inferior right kidney are decreased in conspicuity.  - Right lower quadrant near-fluid attenuation collection is decreased in  size from prior, favor ascites.  - Enlarged fibroid uterus with slight increase in size of pedunculated  right lower quadrant fibroid.  - Continued decreased conspicuity of omental caking.   Other Result Information  This result has an attachment that is not available.  Result Narrative  EXAM: CT Abdomen Pelvis W Contrast DATE: 06/20/18 ACCESSION: [id number] UN DICTATED: 06/20/2018 1:52 PM INTERPRETATION LOCATION: [place]   CLINICAL INDICATION: 33 y.o. female with TOA (tubo-ovarian abscess)  [N70.93 (ICD-10-CM)] follow up tuboovarian abscesses  COMPARISON: CT abdomen pelvis 06/01/18 and earlier.  TECHNIQUE: A spiral CT scan was obtained with IV contrast from the lung  bases to the pubic symphysis. Images were reconstructed in the axial  plane. Coronal and sagittal reformatted images were also provided for  further evaluation.  FINDINGS:   LINES AND TUBES: Interval removal of left lower quadrant pigtail drain.  LOWER THORAX: Unremarkable.  HEPATOBILIARY: Hepatomegaly unchanged. No focal hepatic lesions. The  gallbladder is present with a prominent pharyngeal cap. Mild gallbladder  wall thickening is new from prior, likely reactive. No biliary dilatation.   SPLEEN: Unremarkable. PANCREAS: Unremarkable. ADRENALS: Unremarkable. KIDNEYS/URETERS: Symmetric nephrograms. Irregular focus of hypoattenuation  in the anterior upper pole of the right kidney is new from prior.  Previously noted wedge-shaped foci of hypoattenuation in the mid/inferior  right kidney are less conspicuous compared to prior. No hydronephrosis.  BLADDER: Compressed by enlarged fibroid uterus, limiting evaluation. PELVIC/REPRODUCTIVE ORGANS: Enlarged fibroid uterus is  again noted similar  to prior. Slight interval increase in left hydrosalpinx status post drain  removal now measuring up to 1.5 cm (3:107). Similar appearance of 3.0 x  1.9 cm peripherally enhancing multiloculated collection in the left adnexa  concerning for tuboovarian abscess (3:99).  Questionable redemonstration of previously described fluid-filled tubular  structure in the right adnexa measuring up to 1.0 cm, previously 1.5 cm  (3:113) likely representing improving right hydrosalpinx.  Right lower quadrant 11.5 x 9.4 cm soft tissue mass previously measuring  10.0 x 9.4 cm (3:70), likely representing a uterine fibroid questionably  pedunculated; however, no definite stalk identified again.  Ovaries not visualized.  GI TRACT: Oral contrast opacifies the stomach, small bowel, and colon. No  bowel obstruction Lack of oral contrast limits evaluation. Scattered fecal  lysed loops of jejunum are again noted. There has been  interval decrease  jejunal loop dilation.  PERITONEUM/RETROPERITONEUM AND MESENTERY: Diffuse mesenteric edema is  slightly decreased. Interval decrease in omental caking. Near fluid  attenuation collection in the right lower quadrant measures 4.6 x 1.8 cm,  previously 6.0 x 2.5 cm, favored to represent ascites. Small volume pelvic  free fluid. No free intraperitoneal air.  LYMPH NODES: No enlarged lymph nodes. Subcentimeter retroperitoneal and  pelvic lymph nodes are decreased in prominence. VESSELS: The aorta is normal in caliber. No significant calcified  atherosclerotic disease. The portal venous system appears patent however  evaluation is limited. The hepatic veins and IVC are unremarkable.  BONES AND SOFT TISSUES: No acute osseous abnormalities. Unchanged  fat-containing umbilical hernia. Soft tissue stranding in the subcutaneous  fat of the anterior left lower quadrant at the site of prior pigtail  drain.    Ultrasound CLINICAL DATA: Uterine  fibroids, on Lupron  EXAM: TRANSABDOMINAL AND TRANSVAGINAL ULTRASOUND OF PELVIS  TECHNIQUE: Both transabdominal and transvaginal ultrasound examinations of the pelvis were performed. Transabdominal technique was performed for global imaging of the pelvis including uterus, ovaries, adnexal regions, and pelvic cul-de-sac. It was necessary to proceed with endovaginal exam following the transabdominal exam to visualize the endometrium.  COMPARISON: 10/03/2015  FINDINGS: Uterus  Measurements: 15.0 x 8.0 x 12.2 cm = volume: 770 mL. Enlarged and heterogeneous containing multiple leiomyomata. Measured lesions include a posterior upper uterine submucosal leiomyoma 5.6 x 5.4 x 5.7 cm, a subserosal upper uterine leiomyoma on RIGHT near fundus 3.9 x 3.8 x 4.0 cm, and a submucosal central leiomyoma 4.8 x 4.7 x 4.2 cm. Additional smaller lesions are also identified.  Endometrium  Thickness: Obscured by multiple uterine masses unable to assess.  Right ovary  Measurements: 5.9 x 3.0 x 4.9 cm = volume: 44.5 mL. Dominant cyst with scattered low level internal echoes, lesion measuring 5.0 x 3.0 x 3.2 cm.  Left ovary  Measurements: 4.7 x 4.0 x 3.4 cm = volume: 32.9 mL. Normal morphology without mass  Other findings  No free pelvic fluid. No additional adnexal masses.  IMPRESSION: Enlarged uterus containing multiple leiomyomata several of which are submucosal.  Endometrial complex is obscured, unable to assess thickness.  5.0 cm greatest diameter RIGHT ovarian cyst complicated by scattered low level internal echogenicity; this has significantly decreased in size since a RIGHT ovarian/adnexal cystic lesion of 10/03/2015 question hemorrhagic cyst versus endometrioma.   Electronically Signed By: Lavonia Dana M.D. On: 04/15/2018 16:25    Labs:  06/14/2018 8.1>8.3/26.7<320   Assessment:   Fibroid uterus Pelvic pain DUB Prior history of myomectomy  History of anemia History of endometriosis History of tubo-ovarian abscess Hypertension H/o cardiac enlargement  Ventral hernia  Plan:   - Patient continues to desire surgical management with myomectomy (robotic). The risks of surgery were discussed in detail with the patient including but not limited to: bleeding which may require transfusion or reoperation; infection which may require prolonged hospitalization or re-hospitalization and antibiotic therapy; injury to bowel, bladder, ureters and major vessels or other surrounding organs; need for additional procedures including laparotomy; thromboembolic phenomenon, incisional problems and other postoperative or anesthesia complications. Patient was told that the likelihood that her condition and symptoms will be treated effectively with this surgical management was very high; the postoperative expectations were also discussed in detail. The patient also understands the alternative treatment options which were discussed in full. All questions were answered.  - Patient requests something for pain. She is currently alternating with Aleve and Ibuprofen. Advised not to take  both as they are both NSAIDs, but should chose one.  Has taken both Toradol and Tramadol in the past which helped initially, but have recently become less effective.  Will prescribe Vicodin.  - History of anemia, recently had iron infusion, awaiting on labs .  - Patient has been evaluated by her Cardiologist earlier this month.  Notes that patient is moderate risk for a moderate-risk surgery.  Recent Echo notes LVH, with EF of 55%. Will inform Anesthesiology of findings.  - Encouraged patient to strongly consider removal of right adnexa to prevent further TOA's. Can also consider chromotubation to assess patency of other fallopian tube. - Omental caking noted on recent CT scan. Unclear if due to recent inflammation due to recent significant illness with TOA, or due to prior history  of endometriosis, or concern for malignancy (however a recent CA-125 was normal).  Will likely need to consider intraoperative consultation with either General Surgery or GYN Oncology.   - Ventral hernia present, patient notes that it is not very bothersome. Offered to have patient referred to General Surgery for possible repair, however patient declines at this time.  - HTN better controlled now that medications have been adjusted.  - Continue management of bleeding with Lo-Estrin for now since BPs are better controlled. Failed management with Lupron and Aygestin.  - Instructions reviewed, including NPO after midnight.      Rubie Maid, MD [facility]

## 2018-08-17 ENCOUNTER — Inpatient Hospital Stay: Admission: RE | Admit: 2018-08-17 | Payer: BLUE CROSS/BLUE SHIELD | Source: Ambulatory Visit

## 2018-08-18 ENCOUNTER — Encounter
Admission: RE | Admit: 2018-08-18 | Discharge: 2018-08-18 | Disposition: A | Discharge status: Home / Self Care | Payer: BC Managed Care – PPO | Source: Ambulatory Visit | Attending: Obstetrics and Gynecology | Admitting: Obstetrics and Gynecology

## 2018-08-18 HISTORY — DX: Cardiomyopathy, unspecified: I42.9

## 2018-08-18 HISTORY — DX: Sleep apnea, unspecified: G47.30

## 2018-08-18 NOTE — Patient Instructions (Addendum)
Your procedure is scheduled on:08/24/18 Report to Day Surgery. [facility]ICAL [facility] SECOND FLOOR To find out your arrival time please call [phone] between 1PM - 3PM on 08/23/18.  Remember: Instructions that are not followed completely may result in serious medical risk,  up to and including death, or upon the discretion of your surgeon and anesthesiologist your  surgery may need to be rescheduled.     _X__ 1. Do not eat food after midnight the night before your procedure.                 No gum chewing or hard candies. You may drink clear liquids up to 2 hours                 before you are scheduled to arrive for your surgery- DO not drink clear                 liquids within 2 hours of the start of your surgery.                 Clear Liquids include:  water, apple juice without pulp, clear carbohydrate                 drink such as Clearfast of Gatorade, Black Coffee or Tea (Do not add                 anything to coffee or tea). FINISH CARB DRINK 2 HOURS BEFORE ARRIVAL TIME DAY OF SURGERY  __X__2.  On the morning of surgery brush your teeth with toothpaste and water, you                may rinse your mouth with mouthwash if you wish.  Do not swallow any toothpaste of mouthwash.     _X__ 3.  No Alcohol for 24 hours before or after surgery.   _X__ 4.  Do Not Smoke or use e-cigarettes For 24 Hours Prior to Your Surgery.                 Do not use any chewable tobacco products for at least 6 hours prior to                 surgery.  ____  5.  Bring all medications with you on the day of surgery if instructed.   __X__  6.  Notify your doctor if there is any change in your medical condition      (cold, fever, infections).     Do not wear jewelry, make-up, hairpins, clips or nail polish. Do not wear lotions, powders, or perfumes. You may wear deodorant. Do not shave 48 hours prior to surgery. Men may shave face and neck. Do not bring valuables to the hospital.     [facility] is not responsible for any belongings or valuables.  Contacts, dentures or bridgework may not be worn into surgery. Leave your suitcase in the car. After surgery it may be brought to your room. For patients admitted to the hospital, discharge time is determined by your treatment team.   Patients discharged the day of surgery will not be allowed to drive home.   Please read over the following fact sheets that you were given:   Surgical Site Infection Prevention / SPIROMETRY         ____ Take these medicines the morning of surgery with A SIP OF WATER:    1. METOPROLOL  2. BIRTH CONTROL PILL  3.  4.  5.  6.  ____ Fleet Enema (as directed)   X____ Use CHG Soap as directed  ____ Use inhalers on the day of surgery  ____ Stop metformin 2 days prior to surgery    ____ Take 1/2 of usual insulin dose the night before surgery. No insulin the morning          of surgery.   ____ Stop Coumadin/Plavix/aspirin on   __X__ Stop Anti-inflammatories on  08/18/18   ____ Stop supplements until after surgery.    ____ Bring C-Pap to the hospital.   PRACTICE WITH SPIROMETRY AND BRING DAY OF SURGERY

## 2018-08-19 ENCOUNTER — Other Ambulatory Visit
Admission: RE | Admit: 2018-08-19 | Discharge: 2018-08-19 | Disposition: A | Discharge status: Home / Self Care | Payer: BC Managed Care – PPO | Source: Ambulatory Visit | Attending: Obstetrics and Gynecology | Admitting: Obstetrics and Gynecology

## 2018-08-19 ENCOUNTER — Other Ambulatory Visit: Payer: Self-pay | Admitting: Obstetrics and Gynecology

## 2018-08-19 ENCOUNTER — Other Ambulatory Visit: Payer: Self-pay

## 2018-08-19 DIAGNOSIS — Z9889 Other specified postprocedural states: Secondary | ICD-10-CM | POA: Insufficient documentation

## 2018-08-19 DIAGNOSIS — D649 Anemia, unspecified: Secondary | ICD-10-CM | POA: Insufficient documentation

## 2018-08-19 DIAGNOSIS — Z01812 Encounter for preprocedural laboratory examination: Secondary | ICD-10-CM | POA: Diagnosis present

## 2018-08-19 DIAGNOSIS — D252 Subserosal leiomyoma of uterus: Secondary | ICD-10-CM | POA: Insufficient documentation

## 2018-08-19 DIAGNOSIS — D250 Submucous leiomyoma of uterus: Secondary | ICD-10-CM | POA: Insufficient documentation

## 2018-08-19 DIAGNOSIS — R102 Pelvic and perineal pain: Secondary | ICD-10-CM | POA: Diagnosis not present

## 2018-08-19 DIAGNOSIS — Z1159 Encounter for screening for other viral diseases: Secondary | ICD-10-CM | POA: Diagnosis not present

## 2018-08-19 DIAGNOSIS — N83201 Unspecified ovarian cyst, right side: Secondary | ICD-10-CM | POA: Diagnosis not present

## 2018-08-19 LAB — COMPREHENSIVE METABOLIC PANEL
ALT: 9 U/L (ref 0–44)
AST: 11 U/L — ABNORMAL LOW (ref 15–41)
Albumin: 3.5 g/dL (ref 3.5–5.0)
Alkaline Phosphatase: 45 U/L (ref 38–126)
Anion gap: 8 (ref 5–15)
BUN: 13 mg/dL (ref 6–20)
CO2: 22 mmol/L (ref 22–32)
Calcium: 8.9 mg/dL (ref 8.9–10.3)
Chloride: 104 mmol/L (ref 98–111)
Creatinine, Ser: 0.95 mg/dL (ref 0.44–1.00)
GFR calc Af Amer: >60 mL/min (ref >60)
GFR calc non Af Amer: >60 mL/min (ref >60)
Glucose, Bld: 93 mg/dL (ref 70–99)
Potassium: 3.8 mmol/L (ref 3.5–5.1)
Sodium: 134 mmol/L — ABNORMAL LOW (ref 135–145)
Total Bilirubin: 0.3 mg/dL (ref 0.3–1.2)
Total Protein: 7.5 g/dL (ref 6.5–8.1)

## 2018-08-19 LAB — CBC
HCT: 36.5 % (ref 36.0–46.0)
Hemoglobin: 11.8 g/dL — ABNORMAL LOW (ref 12.0–15.0)
MCH: 28 pg (ref 26.0–34.0)
MCHC: 32.3 g/dL (ref 30.0–36.0)
MCV: 86.5 fL (ref 80.0–100.0)
Platelets: 227 10*3/uL (ref 150–400)
RBC: 4.22 MIL/uL (ref 3.87–5.11)
RDW: 15.2 % (ref 11.5–15.5)
WBC: 4.8 10*3/uL (ref 4.0–10.5)
nRBC: 0 % (ref 0.0–0.2)

## 2018-08-19 MED ORDER — HYDROCODONE-ACETAMINOPHEN 5-325 MG PO TABS: 1.0000 | ORAL_TABLET | Freq: Four times a day (QID) | ORAL | 0 refills | Status: DC | PRN

## 2018-08-20 LAB — NOVEL CORONAVIRUS, NAA (HOSP ORDER, SEND-OUT TO REF LAB; TAT 18-24 HRS): SARS-CoV-2, NAA: NOT DETECTED

## 2018-08-20 LAB — TYPE AND SCREEN
ABO/RH(D): O NEG
Antibody Screen: NEGATIVE

## 2018-08-23 MED ORDER — CLINDAMYCIN PHOSPHATE 900 MG/50ML IV SOLN
900.0000 mg | Freq: Once | INTRAVENOUS | Status: AC
  Administered 2018-08-24: 900 mg via INTRAVENOUS

## 2018-08-23 MED ORDER — GENTAMICIN SULFATE 40 MG/ML IJ SOLN
5.0000 mg/kg | Freq: Once | INTRAVENOUS | Status: AC
  Administered 2018-08-24: 418 mg via INTRAVENOUS
  Filled 2018-08-23: qty 10.5

## 2018-08-24 ENCOUNTER — Encounter: Payer: Self-pay | Admitting: *Deleted

## 2018-08-24 ENCOUNTER — Encounter
Admission: RE | Disposition: A | Discharge status: Home / Self Care | Payer: Self-pay | Source: Home / Self Care | Attending: Obstetrics and Gynecology

## 2018-08-24 ENCOUNTER — Ambulatory Visit: Payer: BC Managed Care – PPO | Admitting: Anesthesiology

## 2018-08-24 ENCOUNTER — Observation Stay
Admission: RE | Admit: 2018-08-24 | Discharge: 2018-08-26 | Disposition: A | Discharge status: Home / Self Care | Payer: BC Managed Care – PPO | Attending: Obstetrics and Gynecology | Admitting: Obstetrics and Gynecology

## 2018-08-24 DIAGNOSIS — G4733 Obstructive sleep apnea (adult) (pediatric): Secondary | ICD-10-CM | POA: Diagnosis not present

## 2018-08-24 DIAGNOSIS — E669 Obesity, unspecified: Secondary | ICD-10-CM | POA: Insufficient documentation

## 2018-08-24 DIAGNOSIS — D252 Subserosal leiomyoma of uterus: Secondary | ICD-10-CM | POA: Diagnosis not present

## 2018-08-24 DIAGNOSIS — N803 Endometriosis of pelvic peritoneum, unspecified: Secondary | ICD-10-CM

## 2018-08-24 DIAGNOSIS — Z8742 Personal history of other diseases of the female genital tract: Secondary | ICD-10-CM

## 2018-08-24 DIAGNOSIS — R102 Pelvic and perineal pain: Secondary | ICD-10-CM

## 2018-08-24 DIAGNOSIS — Z79899 Other long term (current) drug therapy: Secondary | ICD-10-CM | POA: Insufficient documentation

## 2018-08-24 DIAGNOSIS — I10 Essential (primary) hypertension: Secondary | ICD-10-CM | POA: Insufficient documentation

## 2018-08-24 DIAGNOSIS — N938 Other specified abnormal uterine and vaginal bleeding: Secondary | ICD-10-CM | POA: Insufficient documentation

## 2018-08-24 DIAGNOSIS — N736 Female pelvic peritoneal adhesions (postinfective): Secondary | ICD-10-CM

## 2018-08-24 DIAGNOSIS — D500 Iron deficiency anemia secondary to blood loss (chronic): Secondary | ICD-10-CM | POA: Diagnosis not present

## 2018-08-24 DIAGNOSIS — Z6833 Body mass index (BMI) 33.0-33.9, adult: Secondary | ICD-10-CM | POA: Diagnosis not present

## 2018-08-24 DIAGNOSIS — D259 Leiomyoma of uterus, unspecified: Secondary | ICD-10-CM | POA: Diagnosis present

## 2018-08-24 DIAGNOSIS — Z8614 Personal history of Methicillin resistant Staphylococcus aureus infection: Secondary | ICD-10-CM | POA: Diagnosis not present

## 2018-08-24 DIAGNOSIS — K439 Ventral hernia without obstruction or gangrene: Secondary | ICD-10-CM | POA: Diagnosis not present

## 2018-08-24 DIAGNOSIS — Z793 Long term (current) use of hormonal contraceptives: Secondary | ICD-10-CM | POA: Diagnosis not present

## 2018-08-24 DIAGNOSIS — D251 Intramural leiomyoma of uterus: Principal | ICD-10-CM | POA: Insufficient documentation

## 2018-08-24 DIAGNOSIS — D250 Submucous leiomyoma of uterus: Secondary | ICD-10-CM

## 2018-08-24 DIAGNOSIS — Z9889 Other specified postprocedural states: Secondary | ICD-10-CM

## 2018-08-24 HISTORY — PX: ROBOTIC ASSISTED LAPAROSCOPIC LYSIS OF ADHESION: SHX6080

## 2018-08-24 HISTORY — PX: ROBOT ASSISTED MYOMECTOMY: SHX5142

## 2018-08-24 LAB — CBC
HCT: 31.2 % — ABNORMAL LOW (ref 36.0–46.0)
Hemoglobin: 10.5 g/dL — ABNORMAL LOW (ref 12.0–15.0)
MCH: 28.5 pg (ref 26.0–34.0)
MCHC: 33.7 g/dL (ref 30.0–36.0)
MCV: 84.6 fL (ref 80.0–100.0)
Platelets: 246 10*3/uL (ref 150–400)
RBC: 3.69 MIL/uL — ABNORMAL LOW (ref 3.87–5.11)
RDW: 15.2 % (ref 11.5–15.5)
WBC: 7.6 10*3/uL (ref 4.0–10.5)
nRBC: 0 % (ref 0.0–0.2)

## 2018-08-24 LAB — POCT PREGNANCY, URINE: Preg Test, Ur: NEGATIVE

## 2018-08-24 SURGERY — ROBOTIC ASSISTED LAPAROSCOPIC LYSIS OF ADHESION
Anesthesia: General | Site: Abdomen

## 2018-08-24 MED ORDER — SUGAMMADEX SODIUM 200 MG/2ML IV SOLN
INTRAVENOUS | Status: AC
  Filled 2018-08-24: qty 2

## 2018-08-24 MED ORDER — FENTANYL CITRATE (PF) 100 MCG/2ML IJ SOLN
INTRAMUSCULAR | Status: DC | PRN
  Administered 2018-08-24: 50 ug via INTRAVENOUS
  Administered 2018-08-24: 100 ug via INTRAVENOUS
  Administered 2018-08-24: 50 ug via INTRAVENOUS

## 2018-08-24 MED ORDER — MENTHOL 3 MG MT LOZG
1.0000 | LOZENGE | OROMUCOSAL | Status: DC | PRN
  Administered 2018-08-24: 3 mg via ORAL
  Filled 2018-08-24 (×2): qty 9

## 2018-08-24 MED ORDER — SPIRONOLACTONE 25 MG PO TABS
50.0000 mg | ORAL_TABLET | Freq: Every day | ORAL | Status: DC
  Administered 2018-08-25 – 2018-08-26 (×2): 50 mg via ORAL
  Filled 2018-08-24 (×2): qty 2

## 2018-08-24 MED ORDER — SUCCINYLCHOLINE CHLORIDE 20 MG/ML IJ SOLN
INTRAMUSCULAR | Status: AC
  Filled 2018-08-24: qty 1

## 2018-08-24 MED ORDER — HYDRALAZINE HCL 20 MG/ML IJ SOLN
INTRAMUSCULAR | Status: DC | PRN
  Administered 2018-08-24: 10 mg via INTRAVENOUS

## 2018-08-24 MED ORDER — ONDANSETRON HCL 4 MG/2ML IJ SOLN
INTRAMUSCULAR | Status: DC | PRN
  Administered 2018-08-24: 4 mg via INTRAVENOUS

## 2018-08-24 MED ORDER — FENTANYL CITRATE (PF) 100 MCG/2ML IJ SOLN
INTRAMUSCULAR | Status: AC
  Administered 2018-08-24: 25 ug via INTRAVENOUS
  Filled 2018-08-24: qty 2

## 2018-08-24 MED ORDER — ROCURONIUM BROMIDE 100 MG/10ML IV SOLN
INTRAVENOUS | Status: DC | PRN
  Administered 2018-08-24: 30 mg via INTRAVENOUS
  Administered 2018-08-24: 20 mg via INTRAVENOUS
  Administered 2018-08-24: 10 mg via INTRAVENOUS
  Administered 2018-08-24: 20 mg via INTRAVENOUS
  Administered 2018-08-24: 30 mg via INTRAVENOUS

## 2018-08-24 MED ORDER — HYDROMORPHONE HCL 1 MG/ML IJ SOLN
INTRAMUSCULAR | Status: AC
  Filled 2018-08-24: qty 1

## 2018-08-24 MED ORDER — FERROUS SULFATE 325 (65 FE) MG PO TABS
325.0000 mg | ORAL_TABLET | Freq: Two times a day (BID) | ORAL | Status: DC
  Administered 2018-08-24 – 2018-08-26 (×5): 325 mg via ORAL
  Filled 2018-08-24 (×6): qty 1

## 2018-08-24 MED ORDER — MAGNESIUM CITRATE PO SOLN
1.0000 | Freq: Once | ORAL | Status: DC | PRN
  Filled 2018-08-24: qty 296

## 2018-08-24 MED ORDER — FENTANYL CITRATE (PF) 100 MCG/2ML IJ SOLN
25.0000 ug | INTRAMUSCULAR | Status: AC | PRN
  Administered 2018-08-24 (×6): 25 ug via INTRAVENOUS

## 2018-08-24 MED ORDER — BUPIVACAINE HCL (PF) 0.5 % IJ SOLN
INTRAMUSCULAR | Status: AC
  Filled 2018-08-24: qty 30

## 2018-08-24 MED ORDER — HYDRALAZINE HCL 20 MG/ML IJ SOLN
INTRAMUSCULAR | Status: AC
  Filled 2018-08-24: qty 1

## 2018-08-24 MED ORDER — MIDAZOLAM HCL 2 MG/2ML IJ SOLN
INTRAMUSCULAR | Status: DC | PRN
  Administered 2018-08-24: 2 mg via INTRAVENOUS

## 2018-08-24 MED ORDER — FAMOTIDINE 20 MG PO TABS
ORAL_TABLET | ORAL | Status: AC
  Administered 2018-08-24: 20 mg via ORAL
  Filled 2018-08-24: qty 1

## 2018-08-24 MED ORDER — VASOPRESSIN 20 UNIT/ML IV SOLN
INTRAVENOUS | Status: DC | PRN
  Administered 2018-08-24: 20 [IU]

## 2018-08-24 MED ORDER — ZOLPIDEM TARTRATE 5 MG PO TABS: 5.0000 mg | ORAL_TABLET | Freq: Every evening | ORAL | Status: DC | PRN

## 2018-08-24 MED ORDER — HYDROMORPHONE HCL 1 MG/ML IJ SOLN
INTRAMUSCULAR | Status: DC | PRN
  Administered 2018-08-24: .2 mg via INTRAVENOUS
  Administered 2018-08-24: .1 mg via INTRAVENOUS
  Administered 2018-08-24: .2 mg via INTRAVENOUS

## 2018-08-24 MED ORDER — ACETAMINOPHEN 500 MG PO TABS
ORAL_TABLET | ORAL | Status: AC
  Administered 2018-08-24: 1000 mg via ORAL
  Filled 2018-08-24: qty 2

## 2018-08-24 MED ORDER — VASOPRESSIN 20 UNIT/ML IV SOLN
INTRAVENOUS | Status: AC
  Filled 2018-08-24: qty 1

## 2018-08-24 MED ORDER — LACTATED RINGERS IV SOLN
INTRAVENOUS | Status: DC
  Administered 2018-08-24: 08:00:00 via INTRAVENOUS

## 2018-08-24 MED ORDER — DEXAMETHASONE SODIUM PHOSPHATE 10 MG/ML IJ SOLN
INTRAMUSCULAR | Status: DC | PRN
  Administered 2018-08-24: 8 mg via INTRAVENOUS

## 2018-08-24 MED ORDER — KETOROLAC TROMETHAMINE 30 MG/ML IJ SOLN
INTRAMUSCULAR | Status: AC
  Filled 2018-08-24: qty 1

## 2018-08-24 MED ORDER — ONDANSETRON HCL 4 MG/2ML IJ SOLN: 4.0000 mg | Freq: Four times a day (QID) | INTRAMUSCULAR | Status: DC | PRN

## 2018-08-24 MED ORDER — HEMOSTATIC AGENTS (NO CHARGE) OPTIME
TOPICAL | Status: DC | PRN
  Administered 2018-08-24: 1 via TOPICAL

## 2018-08-24 MED ORDER — MIDAZOLAM HCL 2 MG/2ML IJ SOLN
INTRAMUSCULAR | Status: AC
  Filled 2018-08-24: qty 2

## 2018-08-24 MED ORDER — ROCURONIUM BROMIDE 50 MG/5ML IV SOLN
INTRAVENOUS | Status: AC
  Filled 2018-08-24: qty 1

## 2018-08-24 MED ORDER — DEXAMETHASONE SODIUM PHOSPHATE 4 MG/ML IJ SOLN
INTRAMUSCULAR | Status: AC
  Filled 2018-08-24: qty 2

## 2018-08-24 MED ORDER — METHYLENE BLUE 0.5 % INJ SOLN
INTRAVENOUS | Status: AC
  Filled 2018-08-24: qty 10

## 2018-08-24 MED ORDER — DOCUSATE SODIUM 100 MG PO CAPS
100.0000 mg | ORAL_CAPSULE | Freq: Two times a day (BID) | ORAL | Status: DC
  Administered 2018-08-24 – 2018-08-26 (×4): 100 mg via ORAL
  Filled 2018-08-24 (×4): qty 1

## 2018-08-24 MED ORDER — LACTATED RINGERS IV SOLN
INTRAVENOUS | Status: DC
  Administered 2018-08-24 – 2018-08-25 (×2): via INTRAVENOUS

## 2018-08-24 MED ORDER — ONDANSETRON HCL 4 MG PO TABS: 4.0000 mg | ORAL_TABLET | Freq: Four times a day (QID) | ORAL | Status: DC | PRN

## 2018-08-24 MED ORDER — BUPIVACAINE HCL 0.5 % IJ SOLN
INTRAMUSCULAR | Status: DC | PRN
  Administered 2018-08-24: 23 mL

## 2018-08-24 MED ORDER — OXYCODONE HCL 5 MG PO TABS
5.0000 mg | ORAL_TABLET | ORAL | Status: DC | PRN
  Administered 2018-08-25 (×3): 10 mg via ORAL
  Filled 2018-08-24 (×3): qty 2

## 2018-08-24 MED ORDER — ONDANSETRON HCL 4 MG/2ML IJ SOLN
INTRAMUSCULAR | Status: AC
  Filled 2018-08-24: qty 2

## 2018-08-24 MED ORDER — FENTANYL CITRATE (PF) 100 MCG/2ML IJ SOLN
INTRAMUSCULAR | Status: AC
  Filled 2018-08-24: qty 2

## 2018-08-24 MED ORDER — LACTATED RINGERS IV SOLN
INTRAVENOUS | Status: DC
  Administered 2018-08-24 (×2): via INTRAVENOUS

## 2018-08-24 MED ORDER — SODIUM CHLORIDE (PF) 0.9 % IJ SOLN
INTRAMUSCULAR | Status: AC
  Filled 2018-08-24: qty 10

## 2018-08-24 MED ORDER — ACETAMINOPHEN 500 MG PO TABS
1000.0000 mg | ORAL_TABLET | ORAL | Status: AC
  Administered 2018-08-24: 08:00:00 1000 mg via ORAL

## 2018-08-24 MED ORDER — PHENYLEPHRINE HCL (PRESSORS) 10 MG/ML IV SOLN
INTRAVENOUS | Status: AC
  Filled 2018-08-24: qty 1

## 2018-08-24 MED ORDER — PROPOFOL 10 MG/ML IV BOLUS
INTRAVENOUS | Status: DC | PRN
  Administered 2018-08-24: 150 mg via INTRAVENOUS

## 2018-08-24 MED ORDER — CLINDAMYCIN PHOSPHATE 900 MG/50ML IV SOLN
INTRAVENOUS | Status: AC
  Filled 2018-08-24: qty 50

## 2018-08-24 MED ORDER — PHENYLEPHRINE HCL (PRESSORS) 10 MG/ML IV SOLN
INTRAVENOUS | Status: DC | PRN
  Administered 2018-08-24 (×2): 100 ug via INTRAVENOUS
  Administered 2018-08-24: 150 ug via INTRAVENOUS
  Administered 2018-08-24 (×2): 100 ug via INTRAVENOUS
  Administered 2018-08-24: 150 ug via INTRAVENOUS
  Administered 2018-08-24: 100 ug via INTRAVENOUS

## 2018-08-24 MED ORDER — LIDOCAINE HCL (PF) 2 % IJ SOLN
INTRAMUSCULAR | Status: AC
  Filled 2018-08-24: qty 10

## 2018-08-24 MED ORDER — PANTOPRAZOLE SODIUM 40 MG PO TBEC
40.0000 mg | DELAYED_RELEASE_TABLET | Freq: Every day | ORAL | Status: DC
  Administered 2018-08-24 – 2018-08-26 (×3): 40 mg via ORAL
  Filled 2018-08-24 (×3): qty 1

## 2018-08-24 MED ORDER — ALUM & MAG HYDROXIDE-SIMETH 200-200-20 MG/5ML PO SUSP
30.0000 mL | ORAL | Status: DC | PRN
  Administered 2018-08-25: 30 mL via ORAL
  Filled 2018-08-24: qty 30

## 2018-08-24 MED ORDER — SUGAMMADEX SODIUM 200 MG/2ML IV SOLN
INTRAVENOUS | Status: DC | PRN
  Administered 2018-08-24: 170 mg via INTRAVENOUS

## 2018-08-24 MED ORDER — HYDROMORPHONE HCL 1 MG/ML IJ SOLN
0.2000 mg | INTRAMUSCULAR | Status: DC | PRN
  Administered 2018-08-24 (×3): 0.6 mg via INTRAVENOUS
  Administered 2018-08-25: 0.4 mg via INTRAVENOUS
  Administered 2018-08-25 (×3): 0.6 mg via INTRAVENOUS
  Administered 2018-08-25: 0.4 mg via INTRAVENOUS
  Filled 2018-08-24 (×8): qty 1

## 2018-08-24 MED ORDER — ACETAMINOPHEN 500 MG PO TABS
1000.0000 mg | ORAL_TABLET | Freq: Four times a day (QID) | ORAL | Status: DC
  Administered 2018-08-24 – 2018-08-26 (×8): 1000 mg via ORAL
  Filled 2018-08-24 (×9): qty 2

## 2018-08-24 MED ORDER — ONDANSETRON HCL 4 MG/2ML IJ SOLN: 4.0000 mg | Freq: Once | INTRAMUSCULAR | Status: DC | PRN

## 2018-08-24 MED ORDER — SIMETHICONE 80 MG PO CHEW
80.0000 mg | CHEWABLE_TABLET | Freq: Four times a day (QID) | ORAL | Status: DC | PRN
  Administered 2018-08-26: 80 mg via ORAL
  Filled 2018-08-24: qty 1

## 2018-08-24 MED ORDER — BISACODYL 10 MG RE SUPP: 10.0000 mg | Freq: Every day | RECTAL | Status: DC | PRN

## 2018-08-24 MED ORDER — PROPOFOL 10 MG/ML IV BOLUS
INTRAVENOUS | Status: AC
  Filled 2018-08-24: qty 20

## 2018-08-24 MED ORDER — ENOXAPARIN SODIUM 40 MG/0.4ML ~~LOC~~ SOLN
40.0000 mg | SUBCUTANEOUS | Status: DC
  Administered 2018-08-24: 40 mg via SUBCUTANEOUS
  Filled 2018-08-24 (×3): qty 0.4

## 2018-08-24 MED ORDER — LIDOCAINE HCL (CARDIAC) PF 100 MG/5ML IV SOSY
PREFILLED_SYRINGE | INTRAVENOUS | Status: DC | PRN
  Administered 2018-08-24: 60 mg via INTRAVENOUS

## 2018-08-24 MED ORDER — SENNOSIDES-DOCUSATE SODIUM 8.6-50 MG PO TABS: 1.0000 | ORAL_TABLET | Freq: Every evening | ORAL | Status: DC | PRN

## 2018-08-24 MED ORDER — FAMOTIDINE 20 MG PO TABS
20.0000 mg | ORAL_TABLET | Freq: Once | ORAL | Status: AC
  Administered 2018-08-24: 08:00:00 20 mg via ORAL

## 2018-08-24 SURGICAL SUPPLY — 83 items
ADH SKN CLS APL DERMABOND .7 (GAUZE/BANDAGES/DRESSINGS) ×2
ANCHOR TIS RET SYS 235ML (MISCELLANEOUS) ×3 IMPLANT
APL PRP STRL LF DISP 70% ISPRP (MISCELLANEOUS) ×2
APL SRG 38 LTWT LNG FL B (MISCELLANEOUS) ×2
APPLICATOR ARISTA FLEXITIP XL (MISCELLANEOUS) ×3 IMPLANT
BAG TISS RTRVL C235 10X14 (MISCELLANEOUS) ×2
BAG URINE DRAINAGE (UROLOGICAL SUPPLIES) ×4 IMPLANT
BARRIER ADHS 3X4 INTERCEED (GAUZE/BANDAGES/DRESSINGS) ×9 IMPLANT
BLADE SURG SZ11 CARB STEEL (BLADE) ×4 IMPLANT
BRR ADH 4X3 ABS CNTRL BYND (GAUZE/BANDAGES/DRESSINGS) ×6
BRR ADH 6X5 SEPRAFILM 1 SHT (MISCELLANEOUS) ×2
BULB RESERV EVAC DRAIN JP 100C (MISCELLANEOUS) ×3 IMPLANT
CANISTER SUCT 1200ML W/VALVE (MISCELLANEOUS) ×4 IMPLANT
CATH FOLEY 2WAY  5CC 16FR (CATHETERS) ×2
CATH FOLEY 2WAY 5CC 16FR (CATHETERS) ×2
CATH URTH 16FR FL 2W BLN LF (CATHETERS) ×2 IMPLANT
CHLORAPREP W/TINT 26 (MISCELLANEOUS) ×4 IMPLANT
CLOSURE WOUND 1/2 X4 (GAUZE/BANDAGES/DRESSINGS)
COVER LIGHT HANDLE STERIS (MISCELLANEOUS) ×3 IMPLANT
COVER TIP SHEARS 8 DVNC (MISCELLANEOUS) ×1 IMPLANT
COVER TIP SHEARS 8MM DA VINCI (MISCELLANEOUS) ×2
COVER WAND RF STERILE (DRAPES) ×4 IMPLANT
CUP MEDICINE 2OZ PLAST GRAD ST (MISCELLANEOUS) ×3 IMPLANT
DEFOGGER SCOPE WARMER CLEARIFY (MISCELLANEOUS) ×3 IMPLANT
DERMABOND ADVANCED (GAUZE/BANDAGES/DRESSINGS) ×2
DERMABOND ADVANCED .7 DNX12 (GAUZE/BANDAGES/DRESSINGS) ×2 IMPLANT
DISSECTOR MARYLAND 5MM DA VINC (INSTRUMENTS) ×2
DISSECTOR MARYLAND DVNC (INSTRUMENTS) ×1 IMPLANT
DRAIN CHANNEL JP 15F RND 16 (MISCELLANEOUS) ×3 IMPLANT
DRAPE ARM DVNC X/XI (DISPOSABLE) ×4 IMPLANT
DRAPE COLUMN DVNC XI (DISPOSABLE) ×1 IMPLANT
DRAPE DA VINCI XI ARM (DISPOSABLE) ×8
DRAPE DA VINCI XI COLUMN (DISPOSABLE) ×2
DRAPE SHEET LG 3/4 BI-LAMINATE (DRAPES) ×3 IMPLANT
ELECT REM PT RETURN 9FT ADLT (ELECTROSURGICAL) ×4
ELECTRODE REM PT RTRN 9FT ADLT (ELECTROSURGICAL) ×2 IMPLANT
FORCEPS BPLR R/ABLATION 8 DVNC (INSTRUMENTS) ×3 IMPLANT
GLOVE BIO SURGEON STRL SZ 6.5 (GLOVE) ×7 IMPLANT
GLOVE BIO SURGEON STRL SZ8 (GLOVE) ×4 IMPLANT
GLOVE BIO SURGEONS STRL SZ 6.5 (GLOVE) ×3
GLOVE INDICATOR 7.0 STRL GRN (GLOVE) ×10 IMPLANT
GLOVE INDICATOR 8.0 STRL GRN (GLOVE) ×4 IMPLANT
GOWN STRL REUS W/ TWL LRG LVL3 (GOWN DISPOSABLE) ×4 IMPLANT
GOWN STRL REUS W/TWL LRG LVL3 (GOWN DISPOSABLE) ×8
HEMOSTAT ARISTA ABSORB 3G PWDR (HEMOSTASIS) ×3 IMPLANT
IRRIGATION STRYKERFLOW (MISCELLANEOUS) ×2 IMPLANT
IRRIGATOR STRYKERFLOW (MISCELLANEOUS) ×4
IV LACTATED RINGERS 1000ML (IV SOLUTION) ×4 IMPLANT
KIT PINK PAD W/HEAD ARE REST (MISCELLANEOUS) ×4
KIT PINK PAD W/HEAD ARM REST (MISCELLANEOUS) ×2 IMPLANT
KIT TURNOVER CYSTO (KITS) ×4 IMPLANT
MANIPULATOR UTERINE 4.5 ZUMI (MISCELLANEOUS) ×3 IMPLANT
MORCELLATOR XCISE  COR (MISCELLANEOUS)
MORCELLATOR XCISE COR (MISCELLANEOUS) IMPLANT
NS IRRIG 500ML POUR BTL (IV SOLUTION) ×4 IMPLANT
PACK GYN LAPAROSCOPIC (MISCELLANEOUS) ×4 IMPLANT
PAD OB MATERNITY 4.3X12.25 (PERSONAL CARE ITEMS) ×4 IMPLANT
PAD PREP 24X41 OB/GYN DISP (PERSONAL CARE ITEMS) ×4 IMPLANT
RETRACTOR WOUND ALXS 18CM SML (MISCELLANEOUS) ×1 IMPLANT
RTRCTR WOUND ALEXIS O 18CM SML (MISCELLANEOUS) ×4
SEAL CANN UNIV 5-8 DVNC XI (MISCELLANEOUS) ×3 IMPLANT
SEAL XI 5MM-8MM UNIVERSAL (MISCELLANEOUS) ×6
SEALER VESSEL DA VINCI XI (MISCELLANEOUS)
SEALER VESSEL EXT DVNC XI (MISCELLANEOUS) IMPLANT
SEPRAFILM MEMBRANE 5X6 (MISCELLANEOUS) ×3 IMPLANT
SHEARS HARMONIC ACE PLUS 36CM (ENDOMECHANICALS) ×4 IMPLANT
SLEEVE ENDOPATH XCEL 5M (ENDOMECHANICALS) ×8 IMPLANT
SOLUTION ELECTROLUBE (MISCELLANEOUS) ×3 IMPLANT
STAPLER CANNULA SEAL DVNC XI (STAPLE) ×1 IMPLANT
STAPLER CANNULA SEAL XI (STAPLE) ×2
STRIP CLOSURE SKIN 1/2X4 (GAUZE/BANDAGES/DRESSINGS) ×1 IMPLANT
SUT ETHILON 3-0 FS-10 30 BLK (SUTURE) ×4
SUT VIC AB 0 CT2 27 (SUTURE) ×4 IMPLANT
SUT VICRYL 0 AB UR-6 (SUTURE) ×3 IMPLANT
SUT VLOC 90 2/L VL 12 GS22 (SUTURE) ×3 IMPLANT
SUTURE EHLN 3-0 FS-10 30 BLK (SUTURE) ×1 IMPLANT
SUTURE MNCRYL 4-0 (SUTURE) ×4 IMPLANT
SYR 10ML LL (SYRINGE) ×10 IMPLANT
SYR 30ML LL (SYRINGE) ×3 IMPLANT
TROCAR 130MM GELPORT  DAV (MISCELLANEOUS) ×3 IMPLANT
TROCAR ENDO BLADELESS 11MM (ENDOMECHANICALS) ×3 IMPLANT
TROCAR XCEL NON-BLD 5MMX100MML (ENDOMECHANICALS) ×4 IMPLANT
TUBING EVAC SMOKE HEATED PNEUM (TUBING) ×4 IMPLANT

## 2018-08-24 NOTE — Op Note (Signed)
Procedure(s): ROBOTIC ASSISTED LAPAROSCOPIC LYSIS OF ADHESION ROBOTIC ASSISTED MYOMECTOMY Procedure Note  Lynn Boyd female 33 y.o. 08/24/2018  Indications: The patient is a 33 y.o. G0P0000 female with fibroid uterus s/p failed Lupron therapy, endometriosis, pelvic pain, dysfunctional uterine bleeding, history of anemia requiring iron infusions, recent left tubo-ovarian abscess, history of previous myomectomy.  Pre-operative Diagnosis: fibroid uterus s/p failed Lupron therapy, endometriosis, pelvic pain, dysfunctional uterine bleeding, history of anemia requiring iron infusions, recent left tubo-ovarian abscess, h/o previous myomectomy, ventral hernia.   Post-operative Diagnosis: Same, with extensive abdominal/pelvic adhesions, Lynn Boyd -Hugh-Curtis syndrome  Surgeon: Rubie Maid, MD  Assistants:  Surgical scrub tech  Anesthesia: General endotracheal anesthesia     Findings: The uterus was sounded to 16 cm. Extensive abdominal and pelvic adhesive disease. The majority of the omentum was noted to be adherent to the anterior abdominal wall and to the anterior surface of the uterus.  A large fat-containing ventral hernia where the omentum was removed (no bowel was noted to be contained).  The upper abdomen was also noted to have extensive adhesions of the liver, significant for Fitz-Hugh-Curtis syndrome. The gallbladder was unable to be visualized. The anterior and posterior cul-de-sacs were nearly obliterated.  There were 2 small implants along the lower pelvis suspicious for endometriosis implants but were near what was thought to be the bladder so were not excised.  The left ovary and fallopian tube were encased in adhesions.  The right fallopian tube appeared to be significantly dilated. The right ovary was not visualized.   Procedure Details: The patient was seen in the Holding Room. The risks, benefits, complications, treatment options, and expected outcomes were discussed with the  patient.  The patient concurred with the proposed plan, giving informed consent.  The site of surgery properly noted/marked. The patient was taken to the Operating Room, identified as Lynn Boyd and the procedure verified as Procedure(s) (LRB): ROBOTIC ASSISTED MYOMECTOMY (N/A). A Time Out was held and the above information confirmed.  She was then placed under general anesthesia without difficulty. She was placed in the dorsal lithotomy position, and was prepped and draped in a sterile manner.  A straight catheterization was performed. A sterile speculum was inserted into the vagina and the cervix was grasped at the anterior lip using a single-toothed tenaculum.  The uterus was sounded to 16 cm, and a RUMI clamp was placed for uterine manipulation.  The speculum and tenaculum were then removed.   Attention was then turned to the abdomen, where  an 8 mm incision was made supraumubilcally.  The incision was carried down to the level of the fascia using blunt and sharp dissection.  The fascia was grasped with Kocher clamps and tented upward.  The fascia was incised and the peritoneum was entered.The fascia was tagged on each each with a suture of 0-Vicryl.  There were noted to be filmy adhesions at the area of the incision of the omentum which were bluntly freed. A Hasson trochar was then inserted into the incision. The abdomen was allowed to insufflate.  The daVinci camera was then placed supraumbilically. Three more ports were then placed. There were two 8 mm ports that were placed 10 cm laterally to the umbilicus and 2 cm inferiorly on either side.  The 11 mm assistant port was then placed in the right lower quadrant 2 cm medial and superiorr to the iliac crest. All incisions were injected with local anesthetic (Sensorcaine 0.5%, total of 22 cc) prior to port placement. The daVinci robot  was then docked in the normal fashion. The patient was placed in steep Trendelenburg positioning.  Inspection of the  pelvis showed extensive abdominal and pelvic adhesive disease. The majority of the omentum was noted to be adherent to the anterior abdominal wall and to the anterior surface of the uterus. There was a large fat-containing ventral hernia where the omentum was removed (no bowel was noted to be contained). The upper abdomen was also noted to have extensive adhesions of the liver, significant for Fitz-Hugh-Curtis syndrome. The gallbladder was unable to be visualized. Then anterior and posterior cul-de-sacs were nearly obliterated.  There were 2 small implants along the lower pelvis suspicious for endometriosis implants but were near what was thought to be the bladder so were not excised. The left ovary and fallopian tube were encased in adhesions. The right fallopian tube appeared to be significantly dilated. The right ovary was not visualized. Next a Harmonic scalpel was used to perform extensive lysis of adhesions, utilizing approximately 65 minutes of operative time.   Attention was then turned to the uterus where the the largest myoma was noted.   Vasopressin solution was injected over the surface of the fundal leiomyoma to aid with hemostasis.   A vertical incision was made over the uterine leiomyoma into the leiomyoma, and the capsule was recognized.  Using blunt methods, the leiomyoma was freed from the surrounding myometrial tissue and removed intact. The incision was closed using a 2-0 V-lock suture.  Other leiomyomata were removed in similar fashion.  There was at least 1 large anterior myoma, however due to the remaining adhesive disease, there was no area with ease of access and the decision was made not to attempt removal due to possible bladder injury or increased bleeding due to limited visibility. After removal of all the leiomyomata which were accessible (both on the posterior and left lateral aspects of the uterus), the incisions were closed using 2-0 V-lock sutures.     The uterine leiomyomas  were placed in an Endocatch bag and brought to the assistant port site. The port was removed and the incision was extended.  Morcellation of the fibroids were performed until they were able to be removed through the incision.  The fascia was reapproximated with 0-Vicryl.  The subcutaneous layer was reapproximated with 2-0 Vicryl suture.     Hemostasis was noted at incision sites however oozing noted from adhesiolysis.at posterior cul-de-sac.  Arista was placed over the omentum, uterus, and posterior cul-de-sac. Intercede was placed over myomectomy incisions.A Jackson-Pratt drain was placed through one of the port sites and placed in the lower pelvis and placed to gravity suction. The remaining ports were removed and the abdomen was allowed to deflate. The  camera port site's fascia was closed with a cross-tie of the 0-Vicryl sutures. The skin of all incisions was closed with 4-0 Monocryl using subcuticular stitches. Dermabond was placed over all incisions.  The final needle, sponge, and instrument count was correct. The patient tolerated the procedure well. Patient to the recovery room in good condition.   .     Estimated Blood Loss:  750 ml      Drains: straight catheterization prior to procedure with  400 ml of clear urine         Total IV Fluids:  Total I/O In: 1400 [I.V.:1400] Out: 1370 [Urine:400; Drains:220; Blood:750]  ml  Specimens: Uterine leiomyomas         Implants: None         Complications:  None; patient tolerated the procedure well.         Disposition: PACU - hemodynamically stable.         Condition: stable    Rubie Maid, MD [facility]

## 2018-08-24 NOTE — Anesthesia Post-op Follow-up Note (Signed)
Anesthesia QCDR form completed.        

## 2018-08-24 NOTE — Transfer of Care (Signed)
Immediate Anesthesia Transfer of Care Note  Patient: Lynn Boyd  Procedure(s) Performed: ROBOTIC ASSISTED LAPAROSCOPIC LYSIS OF ADHESION (N/A Abdomen) ROBOTIC ASSISTED MYOMECTOMY (N/A Abdomen)  Patient Location: PACU  Anesthesia Type:General  Level of Consciousness: sedated and responds to stimulation  Airway & Oxygen Therapy: Patient Spontanous Breathing and Patient connected to face mask oxygen  Post-op Assessment: Report given to RN and Post -op Vital signs reviewed and stable  Post vital signs: Reviewed and stable  Last Vitals:  Vitals Value Taken Time  BP    Temp 36.3 C 08/24/18 1548  Pulse 86 08/24/18 1548  Resp 28 08/24/18 1548  SpO2 99 % 08/24/18 1548    Last Pain:  Vitals:   08/24/18 1548  TempSrc:   PainSc: Asleep         Complications: No apparent anesthesia complications

## 2018-08-24 NOTE — Anesthesia Postprocedure Evaluation (Signed)
Anesthesia Post Note  Patient: [name]  Procedure(s) Performed: ROBOTIC ASSISTED LAPAROSCOPIC LYSIS OF ADHESION (N/A Abdomen) ROBOTIC ASSISTED MYOMECTOMY (N/A Abdomen)  Patient location during evaluation: PACU Anesthesia Type: General Level of consciousness: awake and alert Pain management: pain level controlled Vital Signs Assessment: post-procedure vital signs reviewed and stable Respiratory status: spontaneous breathing, nonlabored ventilation and respiratory function stable Cardiovascular status: blood pressure returned to baseline and stable Postop Assessment: no apparent nausea or vomiting Anesthetic complications: no     Last Vitals:  Vitals:   08/24/18 1840 08/24/18 1945  BP: (!) 130/94 (!) 137/96  Pulse: 98 (!) 105  Resp: 20 19  Temp: 37 C 36.9 C  SpO2: 98% 100%    Last Pain:  Vitals:   08/24/18 1945  TempSrc: Oral  PainSc:                  Lynn Boyd

## 2018-08-24 NOTE — Interval H&P Note (Signed)
Addendum:  Correction to previous H&P update, surgery scheduled as robotic myomectomy with chromotubation    Rubie Maid, MD [facility]

## 2018-08-24 NOTE — Interval H&P Note (Signed)
History and Physical Interval Note:  08/24/2018 8:05 AM  Lynn Boyd  has presented today for surgery, with the diagnosis of SUBMUCOUS AND SUBSEROUS LEIOMYOMA OF UTERUS, CYST OF RIGHT OVARY, PELVIC PAIN, DUB, H/O ANEMIA, H/O MYOMECTOMY.  The various methods of treatment have been discussed with the patient and family. After consideration of risks, benefits and other options for treatment, the patient has consented to  Procedure(s): LAPAROSCOPIC ABDOMINAL MYOMECTOMY (N/A) AND CHROMOTUBATION as a surgical intervention.  The patient's history has been reviewed, patient examined, no change in status, stable for surgery. I have had a lengthy discussion with patient regarding benefits and risks of bilateral salpingectomy cue to likely tubal occlusion however patient still declines at this time.  Notes that if she develops another abscess at that time she will consider removal.  I have reviewed the patient's chart and labs.  Questions were answered to the patient's satisfaction.     Rubie Maid, MD [facility]

## 2018-08-24 NOTE — Anesthesia Preprocedure Evaluation (Addendum)
Anesthesia Evaluation  Patient identified by MRN, date of birth, ID band Patient awake    Reviewed: Allergy & Precautions, NPO status , Patient's Chart, lab work & pertinent test results, reviewed documented beta blocker date and time   History of Anesthesia Complications Negative for: history of anesthetic complications  Airway Mallampati: III       Dental   Pulmonary sleep apnea (not using CPAP) and Continuous Positive Airway Pressure Ventilation , neg COPD,           Cardiovascular hypertension, Pt. on medications and Pt. on home beta blockers (-) Past MI and (-) CHF (-) dysrhythmias + Valvular Problems/Murmurs      Neuro/Psych neg Seizures    GI/Hepatic Neg liver ROS, Bowel prep,neg GERD  ,  Endo/Other  neg diabetes  Renal/GU negative Renal ROS     Musculoskeletal   Abdominal   Peds  Hematology  (+) anemia ,   Anesthesia Other Findings   Reproductive/Obstetrics                            Anesthesia Physical Anesthesia Plan  ASA: III  Anesthesia Plan: General   Post-op Pain Management:    Induction:   PONV Risk Score and Plan: 3 and Dexamethasone, Ondansetron and Midazolam  Airway Management Planned: Oral ETT  Additional Equipment:   Intra-op Plan:   Post-operative Plan:   Informed Consent: I have reviewed the patients History and Physical, chart, labs and discussed the procedure including the risks, benefits and alternatives for the proposed anesthesia with the patient or authorized representative who has indicated his/her understanding and acceptance.       Plan Discussed with:   Anesthesia Plan Comments:         Anesthesia Quick Evaluation

## 2018-08-24 NOTE — Anesthesia Procedure Notes (Signed)
Procedure Name: Intubation Date/Time: 08/24/2018 9:18 AM Performed by: Jonna Clark, CRNA Pre-anesthesia Checklist: Patient identified, Patient being monitored, Timeout performed, Emergency Drugs available and Suction available Patient Re-evaluated:Patient Re-evaluated prior to induction Oxygen Delivery Method: Circle system utilized Preoxygenation: Pre-oxygenation with 100% oxygen Induction Type: IV induction Ventilation: Mask ventilation without difficulty Laryngoscope Size: Mac and 3 Grade View: Grade I Tube type: Oral Tube size: 7.0 mm Number of attempts: 1 Placement Confirmation: ETT inserted through vocal cords under direct vision,  positive ETCO2 and breath sounds checked- equal and bilateral Secured at: 21 cm Tube secured with: Tape Dental Injury: Teeth and Oropharynx as per pre-operative assessment

## 2018-08-25 ENCOUNTER — Encounter: Payer: Self-pay | Admitting: Obstetrics and Gynecology

## 2018-08-25 DIAGNOSIS — D251 Intramural leiomyoma of uterus: Secondary | ICD-10-CM | POA: Diagnosis not present

## 2018-08-25 LAB — CBC
HCT: 30 % — ABNORMAL LOW (ref 36.0–46.0)
Hemoglobin: 10 g/dL — ABNORMAL LOW (ref 12.0–15.0)
MCH: 28.4 pg (ref 26.0–34.0)
MCHC: 33.3 g/dL (ref 30.0–36.0)
MCV: 85.2 fL (ref 80.0–100.0)
Platelets: 232 10*3/uL (ref 150–400)
RBC: 3.52 MIL/uL — ABNORMAL LOW (ref 3.87–5.11)
RDW: 15.4 % (ref 11.5–15.5)
WBC: 5.2 10*3/uL (ref 4.0–10.5)
nRBC: 0 % (ref 0.0–0.2)

## 2018-08-25 MED ORDER — KETOROLAC TROMETHAMINE 30 MG/ML IJ SOLN
30.0000 mg | Freq: Four times a day (QID) | INTRAMUSCULAR | Status: AC
  Administered 2018-08-25 – 2018-08-26 (×4): 30 mg via INTRAVENOUS
  Filled 2018-08-25 (×4): qty 1

## 2018-08-25 MED ORDER — HYDROMORPHONE HCL 2 MG PO TABS
2.0000 mg | ORAL_TABLET | ORAL | Status: DC | PRN
  Administered 2018-08-25 – 2018-08-26 (×5): 2 mg via ORAL
  Filled 2018-08-25 (×5): qty 1

## 2018-08-25 MED ORDER — MORPHINE SULFATE (PF) 2 MG/ML IV SOLN: 2.0000 mg | INTRAVENOUS | Status: DC | PRN

## 2018-08-25 MED ORDER — METOPROLOL TARTRATE 50 MG PO TABS
50.0000 mg | ORAL_TABLET | Freq: Two times a day (BID) | ORAL | Status: DC
  Administered 2018-08-26: 50 mg via ORAL
  Filled 2018-08-25 (×2): qty 1

## 2018-08-25 NOTE — Progress Notes (Signed)
   08/25/18 1300  Clinical Encounter Type  Visited With Patient  Visit Type Initial;Spiritual support  Referral From Nurse  Consult/Referral To Chaplain  Spiritual Encounters  Spiritual Needs Emotional  Stress Factors  Patient Stress Factors Exhausted;Health changes  Chaplain visit patient upon nurse request. Chaplain enter patient room and patient was lying on back with eyes close. Chaplain introduce herself to patient and patient seemed distant. Chaplain ask patient how was she feeling today and patient said she was fine just In some pain. Chaplain express her concerns for her discomfort. Patient was very quite during the visit, as if she was not interested. Chaplain ask patient if there was something she could do for her and patient decline by stating "no she is ok".

## 2018-08-25 NOTE — Progress Notes (Addendum)
Post-Operative Day # 1, s/p Robotic Myomectomy, extensive adhesiolysis  Subjective: Notes soreness of incisions but otherwise pain is controlled with pain medications.  Has not yet passed flatus or had a BM. Not yet ambulating, has foley catheter in place.  Notes she die not eat much last night as she "wasn't in the mood".  Notes she has not had much bleeding after surgery.   Objective: Temp:  [97.3 F (36.3 C)-98.6 F (37 C)] 97.4 F (36.3 C) (06/18 0700) Pulse Rate:  [84-112] 112 (06/18 0700) Resp:  [11-28] 18 (06/18 0700) BP: (107-148)/(59-96) 135/96 (06/18 0700) SpO2:  [96 %-100 %] 96 % (06/18 0700)  I/O last 3 completed shifts: In: 2607.3 [I.V.:2607.3] Out: 2730 [Urine:1600; Drains:380; Blood:750] Total I/O In: 327.9 [I.V.:327.9] Out: 400 [Urine:400]   Physical Exam:  General: alert and no distress  Lungs: clear to auscultation bilaterally Heart: regular rate and rhythm, S1, S2 normal, no murmur, click, rub or gallop Abdomen: soft, non-tender; bowel sounds normal; no masses,  no organomegaly Pelvis:Bleeding: appropriate,  Incision: healing well, no significant drainage, no dehiscence, no significant erythema.  JP drain in place in right lower incision, with 25 cc of serosanguinous fluid.  Extremities: DVT Evaluation: No evidence of DVT seen on physical exam. Negative Homan's sign. No cords or calf tenderness. No significant calf/ankle edema.  Recent Labs    08/24/18 2151 08/25/18 0730  HGB 10.5* 10.0*  HCT 31.2* 30.0*     Lab Results  Component Value Date   CREATININE 0.95 08/19/2018     Assessment: s/p Procedure(s): ROBOTIC ASSISTED LAPAROSCOPIC LYSIS OF ADHESION (N/A) ROBOTIC ASSISTED MYOMECTOMY (N/A): stable and progressing well  Plan: Advance diet Encourage ambulation Advance to PO medication Discontinue IV fluids Discontinue foley catheter  Discussed intra-operative findings with patient. Discussed that is highly unlikely that she will be able to  reproduce due to the extensive pelvic adhesive disease and tubular damage, as well as the fibroids. Also discussed a trial of Orilissa post-recovery as other treatments for bleeding including Lupron have not worked.  Dispo: likely d/c home later today. Will go home with drain, and return to office next week for removal.    LOS: 1 day    Rubie Maid 08/25/2018, 9:10 AM

## 2018-08-25 NOTE — Progress Notes (Addendum)
Post-Operative Day # 1, s/p Robotic Myomectomy, extensive adhesiolysis  Subjective: voiding and tolerating PO however still not eating much.   Notes she die not eat much last night as she "wasn't in the mood".  Notes she has not had much bleeding after surgery.  Notes she's been falling asleep due to the pain medication and so hasn't had much of an appetite.  Is tolerating liquids and soft foods.  States that she does not think the Roxicodone helps and does not like the way it makes her feel, but the Dilaudid IV does help. Is ambulating with assistance.  Has not yet passed flatus.  Objective: Temp:  [97.4 F (36.3 C)-98.6 F (37 C)] 98.5 F (36.9 C) (06/18 1630) Pulse Rate:  [98-121] 121 (06/18 1630) Resp:  [18-20] 18 (06/18 1630) BP: (122-137)/(87-96) 122/87 (06/18 1630) SpO2:  [96 %-100 %] 99 % (06/18 1630)  I/O last 3 completed shifts: In: 2607.3 [I.V.:2607.3] Out: 2730 [Urine:1600; Drains:380; Blood:750] Total I/O In: 327.9 [I.V.:327.9] Out: 835 [Urine:775; Drains:60]   Physical Exam:  General: alert and no distress  Lungs: clear to auscultation bilaterally Heart: regular rate and rhythm, S1, S2 normal, no murmur, click, rub or gallop Abdomen: soft, tender at incision sites, abdomen mildly distended. Bowel sounds present.  Pelvis:Bleeding: appropriate,  Incision: healing well, no significant drainage, no dehiscence, no significant erythema.  JP drain in place in right lower incision, with 75 cc of serosanguinous fluid, dressing however is moderately soaked. Extremities: DVT Evaluation: No evidence of DVT seen on physical exam. Negative Homan's sign. No cords or calf tenderness. No significant calf/ankle edema. SCDs in place.     CBC Latest Ref Rng & Units 08/25/2018 08/24/2018 08/19/2018  WBC 4.0 - 10.5 K/uL 5.2 7.6 4.8  Hemoglobin 12.0 - 15.0 g/dL 10.0(L) 10.5(L) 11.8(L)  Hematocrit 36.0 - 46.0 % 30.0(L) 31.2(L) 36.5  Platelets 150 - 400 K/uL 232 246 227     Lab Results   Component Value Date   CREATININE 0.95 08/19/2018     Assessment: s/p Procedure(s): ROBOTIC ASSISTED LAPAROSCOPIC LYSIS OF ADHESION (N/A) ROBOTIC ASSISTED MYOMECTOMY (N/A): stable and progressing well  Plan: Regular diet as tolerated Encourage ambulation Will change PO medication from Roxicodone to Dilaudid. Will also start NSAIDS (held initially post-op due to concern for worsening of bleeding, however Hgb has been relatively stable and JP drain with serosanguinous fluid).  This may also help with patient's pain.  Will give milk of magnesium for bloating.  Nurse has noted that patient has been tearful several times throughout the day.  Had Chaplain consultation, however patient did not engage at that time.  Patient notes that she has been grappling with the fact that she may never bear children and at some point may have to consider a hysterectomy due to her bleeding. I sympathized with the patient and explained that what she was feeling is normal, and is a form of grief. Will continue to monitor.  Will continue to hold BP meds for today as BPs still normal to low-normal, can resume tomorrow. Dispo: will remain inpatient for 1 more day. Will go home with drain, and return to office next week for removal.     LOS: 1 day    Rubie Maid, MD 08/25/2018, 6:08 PM

## 2018-08-26 DIAGNOSIS — D251 Intramural leiomyoma of uterus: Secondary | ICD-10-CM | POA: Diagnosis not present

## 2018-08-26 LAB — SURGICAL PATHOLOGY

## 2018-08-26 MED ORDER — NORETHIN-ETH ESTRADIOL-FE 0.4-35 MG-MCG PO CHEW
1.0000 | CHEWABLE_TABLET | Freq: Once | ORAL | Status: DC
  Filled 2018-08-26 (×2): qty 1

## 2018-08-26 MED ORDER — MAGNESIUM HYDROXIDE 400 MG/5ML PO SUSP
30.0000 mL | Freq: Once | ORAL | Status: AC
  Administered 2018-08-26: 30 mL via ORAL
  Filled 2018-08-26: qty 30

## 2018-08-26 MED ORDER — KETOROLAC TROMETHAMINE 10 MG PO TABS: 10.0000 mg | ORAL_TABLET | Freq: Four times a day (QID) | ORAL | 0 refills | Status: DC | PRN

## 2018-08-26 MED ORDER — ONDANSETRON HCL 4 MG PO TABS: 4.0000 mg | ORAL_TABLET | Freq: Four times a day (QID) | ORAL | 0 refills | Status: DC | PRN

## 2018-08-26 MED ORDER — SIMETHICONE 80 MG PO CHEW: 80.0000 mg | CHEWABLE_TABLET | Freq: Four times a day (QID) | ORAL | 0 refills | Status: DC | PRN

## 2018-08-26 MED ORDER — ACETAMINOPHEN 500 MG PO TABS: 1000.0000 mg | ORAL_TABLET | Freq: Three times a day (TID) | ORAL | 0 refills | Status: DC | PRN

## 2018-08-26 MED ORDER — DOCUSATE SODIUM 100 MG PO CAPS: 100.0000 mg | ORAL_CAPSULE | Freq: Two times a day (BID) | ORAL | 2 refills | Status: DC | PRN

## 2018-08-26 MED ORDER — HYDROMORPHONE HCL 2 MG PO TABS: 2.0000 mg | ORAL_TABLET | ORAL | 0 refills | Status: DC | PRN

## 2018-08-26 NOTE — Progress Notes (Signed)
Discharge instructions provided. Pt verbalizes understanding of all instructions and follow-up care.  Pt discharged to home at 1905 on 08/27/18 via wheelchair by RN. Reed Breech, RN 08/26/2018 7:46 PM

## 2018-08-26 NOTE — Progress Notes (Signed)
Post-Operative Day # 2, s/p Robotic Myomectomy, extensive adhesiolysis  Subjective: voiding and tolerating PO.  Has begun to have a little more of an appetite.  Still has not passed gas or had BM but "feels it rumbling in her stomach". Has ambulated in room. Feels new pain medications are working much better for her.   Objective: Temp:  [97.5 F (36.4 C)-98.6 F (37 C)] 97.5 F (36.4 C) (06/19 0431) Pulse Rate:  [95-121] 101 (06/19 0431) Resp:  [16-20] 16 (06/19 0431) BP: (102-131)/(61-89) 103/63 (06/19 0431) SpO2:  [99 %-100 %] 100 % (06/19 0431)  I/O last 3 completed shifts: In: 1715.1 [P.O.:480; I.V.:1235.1] Out: 2780 [Urine:2525; Drains:255] No intake/output data recorded.   Physical Exam:  General: alert and no distress  Lungs: clear to auscultation bilaterally Heart: regular rate and rhythm, S1, S2 normal, no murmur, click, rub or gallop Abdomen: soft, tender at incision sites, abdomen mildly distended. Bowel sounds present.  Pelvis:Bleeding: appropriate,  Incision: healing well, no significant drainage, no dehiscence, no significant erythema.  JP drain in place in right lower incision, with 15 cc of serosanguinous fluid, dressing clean and dry. Extremities: DVT Evaluation: No evidence of DVT seen on physical exam. Negative Homan's sign. No cords or calf tenderness. No significant calf/ankle edema.      CBC Latest Ref Rng & Units 08/25/2018 08/24/2018 08/19/2018  WBC 4.0 - 10.5 K/uL 5.2 7.6 4.8  Hemoglobin 12.0 - 15.0 g/dL 10.0(L) 10.5(L) 11.8(L)  Hematocrit 36.0 - 46.0 % 30.0(L) 31.2(L) 36.5  Platelets 150 - 400 K/uL 232 246 227     Lab Results  Component Value Date   CREATININE 0.95 08/19/2018     Assessment: s/p Procedure(s): ROBOTIC ASSISTED LAPAROSCOPIC LYSIS OF ADHESION (N/A) ROBOTIC ASSISTED MYOMECTOMY (N/A): stable and progressing well  Plan: Regular diet as tolerated, slowly advancing.  Encourage aggressive ambulation Continue use of incentive  spirometer.  Given a dose of milk of magnesium for bloating yesterday. Can administer again today. If no flatus or BM by end of the day, will administer enema. Also will prescribe Simethicone.  Resume home BP mess Continue to monitor output from JP drain. Will d/c home with drain.  Dispo: D/C home later today.     LOS: 1 day    Rubie Maid, MD 08/26/2018, 7:35 AM

## 2018-08-26 NOTE — Discharge Instructions (Signed)
Call your doctor for incision concerns including redness, swelling, bleeding or drainage, or if begins to come apart.  No strenuous activity or heavy lifting for 6 weeks.  No intercourse, tampons, or douching for 6 weeks.  No tub baths- showers only.  No driving for 2 weeks or while taking Dilaudid.  Call your doctor for increased pain or vaginal bleeding, temperature above 100.4, depression, or concerns.

## 2018-08-28 ENCOUNTER — Other Ambulatory Visit: Payer: Self-pay | Admitting: Obstetrics and Gynecology

## 2018-08-29 NOTE — Discharge Summary (Signed)
Gynecology Physician Postoperative Discharge Summary  Patient ID: Lynn Boyd MRN: [id number] DOB/AGE: 35-Jul-1987 35 y.o.  Admit Date: 08/24/2018 Discharge Date: 08/26/2018   Preoperative Diagnoses:  Intramural and submucous leiomyoma of uterus Iron deficiency anemia due to chronic blood loss  Endometriosis of pelvic peritoneum  History of PID  Pelvic pain  DUB (dysfunctional uterine bleeding)  History of myomectomy    Procedures: Procedure(s) (LRB): ROBOTIC ASSISTED LAPAROSCOPIC LYSIS OF ADHESION (N/A) ROBOTIC ASSISTED MYOMECTOMY (N/A)  Hospital Course:  Lynn Boyd is a 33 y.o. G0P0000  admitted for scheduled surgery.  She underwent the procedures as mentioned above, her operation was uncomplicated. For further details about surgery, please refer to the operative report. Patient had an uncomplicated postoperative course. By time of discharge on POD#2, her pain was controlled on oral pain medications; she was ambulating, voiding without difficulty, tolerating regular diet and passing flatus. She was deemed stable for discharge to home.   Significant Labs: CBC Latest Ref Rng & Units 08/25/2018 08/24/2018 08/19/2018  WBC 4.0 - 10.5 K/uL 5.2 7.6 4.8  Hemoglobin 12.0 - 15.0 g/dL 10.0(L) 10.5(L) 11.8(L)  Hematocrit 36.0 - 46.0 % 30.0(L) 31.2(L) 36.5  Platelets 150 - 400 K/uL 232 246 227    Discharge Exam: Blood pressure 100/63, pulse 98, temperature 98.6 F (37 C), temperature source Oral, resp. rate 18, SpO2 100 %. General appearance: alert and no distress  Resp: clear to auscultation bilaterally  Cardio: regular rate and rhythm  GI: soft, non-tender; bowel sounds normal; no masses, no organomegaly.  Incision: C/D/I, no erythema, no drainage noted. JP drain in place in right lower incision.  Pelvic: scant blood on pad  Extremities: extremities normal, atraumatic, no cyanosis or edema and Homans sign is negative, no sign of DVT  Discharged Condition: Stable  Disposition:  Home  Discharge Instructions    Discharge patient   Complete by: As directed    Can d/c home at 5 pm if tolerating diet, ambulating, and has passed flatus/BM.   Discharge disposition: 01-Home or Self Care   Discharge patient date: 08/26/2018     Allergies as of 08/26/2018      Reactions   Bactrim [sulfamethoxazole-trimethoprim] Swelling   Latex Rash      Medication List    STOP taking these medications   HYDROcodone-acetaminophen 5-325 MG tablet Commonly known as: NORCO/VICODIN     TAKE these medications   acetaminophen 500 MG tablet Commonly known as: TYLENOL Take 2 tablets (1,000 mg total) by mouth every 8 (eight) hours as needed for mild pain.   azelastine 0.1 % nasal spray Commonly known as: ASTELIN Place 1 spray into both nostrils daily as needed for rhinitis. Use in each nostril as directed   cholecalciferol 25 MCG (1000 UT) tablet Commonly known as: VITAMIN D3 Take 1,000 Units by mouth daily.   clindamycin-benzoyl peroxide gel Commonly known as: BENZACLIN Apply 1 application topically once a week.   docusate sodium 100 MG capsule Commonly known as: COLACE Take 1 capsule (100 mg total) by mouth 2 (two) times daily as needed.   ferrous sulfate 325 (65 FE) MG EC tablet Take 325 mg by mouth daily with breakfast.   HYDROmorphone 2 MG tablet Commonly known as: DILAUDID Take 1 tablet (2 mg total) by mouth every 3 (three) hours as needed for moderate pain or severe pain.   ketorolac 10 MG tablet Commonly known as: TORADOL Take 1 tablet (10 mg total) by mouth every 6 (six) hours as needed. Do not take with Ibuprofen  or Aleve   metoprolol tartrate 50 MG tablet Commonly known as: LOPRESSOR Take 50 mg by mouth daily.   naproxen sodium 220 MG tablet Commonly known as: ALEVE Take 440 mg by mouth every 8 (eight) hours as needed (pain).   norethindrone-ethinyl estradiol-iron 1.5-30 MG-MCG tablet Commonly known as: Loestrin Fe 1.5/30 Take 1 tablet by mouth  daily.   ondansetron 4 MG tablet Commonly known as: ZOFRAN Take 1 tablet (4 mg total) by mouth every 6 (six) hours as needed for nausea.   simethicone 80 MG chewable tablet Commonly known as: MYLICON Chew 1 tablet (80 mg total) by mouth 4 (four) times daily as needed for flatulence.   spironolactone 25 MG tablet Commonly known as: ALDACTONE Take 50 mg by mouth daily.   tretinoin 0.025 % cream Commonly known as: RETIN-A Apply 1 application topically at bedtime.   valACYclovir 500 MG tablet Commonly known as: VALTREX Take 500 mg by mouth 2 (two) times daily as needed (flare-ups).      Future Appointments  Date Time Provider [place]  08/30/2018  1:45 PM Rubie Maid, MD [facility] None   Follow-up Information    Rubie Maid, MD. Go in 4 day(s).   Specialties: Obstetrics and Gynecology, Radiology Why: Appointment scheduled with Dr. Marcelline Mates for Tuesday, August 30, 2018 at 1:45 pm for post-op check and drain removal. Contact information: [address] [place] [address] [phone]           Signed:   Rubie Maid, MD [facility]

## 2018-08-30 ENCOUNTER — Encounter: Payer: Self-pay | Admitting: Obstetrics and Gynecology

## 2018-08-30 ENCOUNTER — Encounter: Payer: BLUE CROSS/BLUE SHIELD | Admitting: Obstetrics and Gynecology

## 2018-08-30 ENCOUNTER — Telehealth: Payer: Self-pay

## 2018-08-30 NOTE — Telephone Encounter (Signed)
Pt called to reschedule her appointment this evening. From 1:45pm-3pm. Pt stated that she was able to do that.

## 2018-08-31 ENCOUNTER — Encounter: Payer: Self-pay | Admitting: Obstetrics and Gynecology

## 2018-08-31 ENCOUNTER — Other Ambulatory Visit: Payer: Self-pay

## 2018-08-31 ENCOUNTER — Ambulatory Visit (INDEPENDENT_AMBULATORY_CARE_PROVIDER_SITE_OTHER): Payer: BC Managed Care – PPO | Admitting: Obstetrics and Gynecology

## 2018-08-31 VITALS — BP 124/72 | HR 90 | Ht 62.0 in | Wt 180.0 lb

## 2018-08-31 DIAGNOSIS — D252 Subserosal leiomyoma of uterus: Secondary | ICD-10-CM

## 2018-08-31 DIAGNOSIS — N809 Endometriosis, unspecified: Secondary | ICD-10-CM

## 2018-08-31 DIAGNOSIS — Z09 Encounter for follow-up examination after completed treatment for conditions other than malignant neoplasm: Secondary | ICD-10-CM

## 2018-08-31 DIAGNOSIS — Z9889 Other specified postprocedural states: Secondary | ICD-10-CM

## 2018-08-31 DIAGNOSIS — D251 Intramural leiomyoma of uterus: Secondary | ICD-10-CM

## 2018-08-31 DIAGNOSIS — N938 Other specified abnormal uterine and vaginal bleeding: Secondary | ICD-10-CM

## 2018-08-31 MED ORDER — ORILISSA 150 MG PO TABS: 150.0000 mg | ORAL_TABLET | Freq: Every day | ORAL | 11 refills | Status: DC

## 2018-08-31 NOTE — Progress Notes (Signed)
    OBSTETRICS/GYNECOLOGY POST-OPERATIVE CLINIC VISIT  Subjective:     Lynn Boyd is a 33 y.o. female who presents to the clinic 1 weeks status post robotic myomectomy  for robotic myomectomy with extensive lysis of adhesions. Eating a regular diet without difficulty. Bowel movements are normal. Pain is controlled with current analgesics. Medications being used: prescription NSAID's including ketorolac (Toradol) and narcotic analgesics including hydromorphone (Dilaudid).  Notes that she only has to take them occasionally.  Notes bleeding is down to only spotting.  Empties the JP drain ~ 4 times a day (when she goes to the restroom), however is only 1/4th full (~ 25 cc).   The following portions of the patient's history were reviewed and updated as appropriate: allergies, current medications, past family history, past medical history, past social history, past surgical history and problem list.  Review of Systems Pertinent items noted in HPI and remainder of comprehensive ROS otherwise negative.    Objective:    BP 124/72   Pulse 90   Ht 5\' 2"  (1.575 m)   Wt 180 lb (81.6 kg)   LMP  (LMP Unknown)   BMI 32.92 kg/m  General:  alert and no distress  Abdomen: soft, bowel sounds active, non-tender  Incision:   healing well, no erythema, no hernia, no seroma, no swelling, no dehiscence, incision well approximated.  JP drain in place, with 25 cc of serosanguinous drainage present.     Labs:  Lab Results  Component Value Date   WBC 5.2 08/25/2018   HGB 10.0 (L) 08/25/2018   HCT 30.0 (L) 08/25/2018   MCV 85.2 08/25/2018   PLT 232 08/25/2018    Pathology:  DIAGNOSIS:  A. UTERUS; MYOMECTOMY:  - LEIOMYOMAS, MULTIPLE FRAGMENTS, WITH HYALINIZATION AND ISCHEMIC-TYPE  CHANGES.  Assessment:    Postop check S/P myomectomy Intramural and subserous leiomyoma of uterus Endometriosis  DUB (dysfunctional uterine bleeding)  Plan:   1. Continue any current medications. 2. Wound care  discussed.  Will return in 1 week for drain removal.  3. Operative findings again reviewed. Pathology report discussed.  Discussed options for patient regarding further management of her endometriosis, DUB, and fibroids.  Would recommend a trial of Orilissa. Discussed risks/benefits. Given 2 weeks of samples.  4. Activity restrictions: no bending, stooping, or squatting and no lifting more than 10-15 pounds 5. Anticipated return to work: 4 weeks. 6. Follow up: 4 weeks for final post-op check.     Rubie Maid, MD [facility]

## 2018-08-31 NOTE — Progress Notes (Signed)
Pt is present today for her 1 week post op visit. Pt stated that she was still a little sore but was doing well.

## 2018-09-05 ENCOUNTER — Telehealth: Payer: Self-pay

## 2018-09-05 NOTE — Telephone Encounter (Signed)
No answer [facility] for prescreening.

## 2018-09-06 ENCOUNTER — Encounter: Payer: Self-pay | Admitting: Obstetrics and Gynecology

## 2018-09-06 ENCOUNTER — Ambulatory Visit (INDEPENDENT_AMBULATORY_CARE_PROVIDER_SITE_OTHER): Payer: BC Managed Care – PPO | Admitting: Obstetrics and Gynecology

## 2018-09-06 ENCOUNTER — Other Ambulatory Visit: Payer: Self-pay

## 2018-09-06 VITALS — BP 128/86 | HR 78 | Ht 62.0 in | Wt 179.5 lb

## 2018-09-06 DIAGNOSIS — N938 Other specified abnormal uterine and vaginal bleeding: Secondary | ICD-10-CM

## 2018-09-06 DIAGNOSIS — Z09 Encounter for follow-up examination after completed treatment for conditions other than malignant neoplasm: Secondary | ICD-10-CM

## 2018-09-06 DIAGNOSIS — N803 Endometriosis of pelvic peritoneum, unspecified: Secondary | ICD-10-CM

## 2018-09-06 DIAGNOSIS — Z9889 Other specified postprocedural states: Secondary | ICD-10-CM

## 2018-09-06 NOTE — Progress Notes (Signed)
Pt is present today to have her tubes removed from surgery. Pt stated that she was still sore in the around the outer abd areas.

## 2018-09-06 NOTE — Progress Notes (Signed)
    OBSTETRICS/GYNECOLOGY POST-OPERATIVE CLINIC VISIT  Subjective:     Lynn Boyd is a 33 y.o. female who presents to the clinic 2 weeks status post robotic myomectomy  for robotic myomectomy with extensive lysis of adhesions. Presents for JP drain removal. Notes no significant drainage in the past 1-2 days.   Of note, patient states that after she started the [place], she began to have a slight increase in her bleeding, when she had previously just gotten down to no bleeding. Notes she stopped use of the medication after several days.  The bleeding has stopped again.    The following portions of the patient's history were reviewed and updated as appropriate: allergies, current medications, past family history, past medical history, past social history, past surgical history and problem list.  Review of Systems Pertinent items noted in HPI and remainder of comprehensive ROS otherwise negative.    Objective:    BP 128/86   Pulse 78   Ht 5\' 2"  (1.575 m)   Wt 179 lb 8 oz (81.4 kg)   BMI 32.83 kg/m  General:  alert and no distress  Abdomen: soft, bowel sounds active, non-tender  Incision:   healing well, no erythema, no hernia, no seroma, no swelling, no dehiscence, incision well approximated.  JP drain in place, with 5 cc of serous drainage present.     Assessment:   Postop check S/P myomectomy Endometriosis  DUB (dysfunctional uterine bleeding)  Plan:   1. Patient notes that she desires to hold on use of the Orilissa until after her 6 week check.  Ok to do so.  2. JP drain removed today.  Incision approximated with Steri-strips. Wound care discussed.   3. Activity restrictions: no bending, stooping, or squatting and no lifting more than 10-15 pounds 4. 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 5. Follow up: 4 weeks for final post-op check.     Rubie Maid, MD [facility]

## 2018-10-03 NOTE — Progress Notes (Signed)
Pt is present for post op visit after surgery. Pt stated that she is healing well.

## 2018-10-04 ENCOUNTER — Other Ambulatory Visit: Payer: Self-pay

## 2018-10-04 ENCOUNTER — Ambulatory Visit (INDEPENDENT_AMBULATORY_CARE_PROVIDER_SITE_OTHER): Payer: BC Managed Care – PPO | Admitting: Obstetrics and Gynecology

## 2018-10-04 ENCOUNTER — Encounter: Payer: Self-pay | Admitting: Obstetrics and Gynecology

## 2018-10-04 VITALS — BP 148/74 | HR 66 | Ht 62.0 in | Wt 181.8 lb

## 2018-10-04 DIAGNOSIS — D250 Submucous leiomyoma of uterus: Secondary | ICD-10-CM

## 2018-10-04 DIAGNOSIS — N809 Endometriosis, unspecified: Secondary | ICD-10-CM

## 2018-10-04 DIAGNOSIS — Z9889 Other specified postprocedural states: Secondary | ICD-10-CM

## 2018-10-04 DIAGNOSIS — D251 Intramural leiomyoma of uterus: Secondary | ICD-10-CM

## 2018-10-04 DIAGNOSIS — Z09 Encounter for follow-up examination after completed treatment for conditions other than malignant neoplasm: Secondary | ICD-10-CM

## 2018-10-04 DIAGNOSIS — Z862 Personal history of diseases of the blood and blood-forming organs and certain disorders involving the immune mechanism: Secondary | ICD-10-CM

## 2018-10-04 DIAGNOSIS — N938 Other specified abnormal uterine and vaginal bleeding: Secondary | ICD-10-CM

## 2018-10-04 NOTE — Progress Notes (Signed)
    OBSTETRICS/GYNECOLOGY POST-OPERATIVE CLINIC VISIT  Subjective:     Lynn Boyd is a 33 y.o. female who presents to the clinic 6 weeks status post robotic myomectomy  for robotic myomectomy with extensive lysis of adhesions. Indications for surgery: Fibroid uterus, DUB, pelvici pain, recent h/o left TOA.  Doing well post-operatively. Has no complaints. Notes that she had her first menstrual cycle since her surgery. Cycle was only heavy for 3-4 days, now down to spotting.  Overall pleased with most recent cycle. Also initiated [place] last week.    The following portions of the patient's history were reviewed and updated as appropriate: allergies, current medications, past family history, past medical history, past social history, past surgical history and problem list.  Review of Systems Pertinent items noted in HPI and remainder of comprehensive ROS otherwise negative.    Objective:    BP (!) 148/74   Pulse 66   Ht 5\' 2"  (1.575 m)   Wt 181 lb 12.8 oz (82.5 kg)   LMP 09/27/2018   BMI 33.25 kg/m  General:  alert and no distress  Abdomen: soft, bowel sounds active, non-tender  Incision:   well-healed, no erythema, no hernia, no seroma, no swelling, no dehiscence, incision well approximated.      Assessment:   Postop check S/P myomectomy Endometriosis  DUB (dysfunctional uterine bleeding)  Plan:   1. Patient has now recently resumed Orilissa for management of fibroids, DUB, and h/o endometriosis. Denies any side effects currently.  2. Patient notes that she has been thinking more regarding removal of her fallopian tube(s) due to history of tubo-ovarian abscess x 2.  Reports that she has also looked into "having her tubes unblocked" by a specialist in [place] vs egg freezing. Discussed options with patient.  Advised that egg freezing may be a more viable option.  3. Activity restrictions: none 4. Anticipated return to work: now. 5. Follow up: 1 month for f/u of bleeding  and endometriosis.     Rubie Maid, MD [facility]

## 2018-11-03 ENCOUNTER — Ambulatory Visit (INDEPENDENT_AMBULATORY_CARE_PROVIDER_SITE_OTHER): Payer: BC Managed Care – PPO | Admitting: Obstetrics and Gynecology

## 2018-11-03 ENCOUNTER — Encounter: Payer: Self-pay | Admitting: Obstetrics and Gynecology

## 2018-11-03 ENCOUNTER — Other Ambulatory Visit: Payer: Self-pay

## 2018-11-03 VITALS — BP 121/74 | HR 65 | Ht 62.0 in | Wt 185.0 lb

## 2018-11-03 DIAGNOSIS — N938 Other specified abnormal uterine and vaginal bleeding: Secondary | ICD-10-CM

## 2018-11-03 DIAGNOSIS — Z8742 Personal history of other diseases of the female genital tract: Secondary | ICD-10-CM

## 2018-11-03 DIAGNOSIS — D219 Benign neoplasm of connective and other soft tissue, unspecified: Secondary | ICD-10-CM

## 2018-11-03 DIAGNOSIS — Z9889 Other specified postprocedural states: Secondary | ICD-10-CM

## 2018-11-03 DIAGNOSIS — L7682 Other postprocedural complications of skin and subcutaneous tissue: Secondary | ICD-10-CM

## 2018-11-03 DIAGNOSIS — K439 Ventral hernia without obstruction or gangrene: Secondary | ICD-10-CM

## 2018-11-03 NOTE — Progress Notes (Signed)
    GYNECOLOGY PROGRESS NOTE  Subjective:    Patient ID: Lynn Boyd, female    DOB: 1985-03-26, 33 y.o.   MRN: [id number]  HPI  Patient is a 33 y.o. G0P0000 female who presents for 1 month follow up of medication.  Patient was started on Orilissa 1 month ago after undergoing a robotic myomectomy. She states that she had her first period, cycle lasted for 7 days, however was much lighter than it used to be.  She also reports some spotting approximately 1 week prior to her cycle. However this is light.  She notes that overall she is pleased so far with the medication compared to her prior history of abnormal bleeding.   She is also complaining of occasional pinching pain from her right lateral laparoscopic incision site.    The following portions of the patient's history were reviewed and updated as appropriate: allergies, current medications, past family history, past medical history, past social history, past surgical history and problem list.  Review of Systems Pertinent items noted in HPI and remainder of comprehensive ROS otherwise negative.   Objective:   Blood pressure 121/74, pulse 65, height 5\' 2"  (1.575 m), weight 185 lb (83.9 kg). General appearance: alert and no distress Abdomen: soft, non-tender; bowel sounds normal; no masses,  no organomegaly. Moderate supraumbilical ventral hernia palpable.  Incisions well healed.  Pelvic: pelvic deferred.    Assessment:   Fibroid uterus History of myomectomy Ventral hernia without obstruction or gangrene History of endometriosis DUB (dysfunctional uterine bleeding) Incisional pain  Plan:   1. Continue use of Orilissa for management of uterine fibroids, endometriosis.  Overall patient doing well. Discussed that she would note maximum response to current medication after 3 months of use. Will f/u again in 2 months.  2. Ventral hernia present, stable.  Patient not yet looking to have surgically repaired at this time.  3. DUB  improved since myomectomy and Orilissa use.  Continue use.  4. Incisional pain sporadic, sometimes sharp shooting. May possibly be nerve regeneration. Discussed use of topical lidocaine gel as needed to area.    Rubie Maid, MD [facility]

## 2018-11-03 NOTE — Progress Notes (Signed)
Pt present for 1 month follow up. Pt stated that she is doing better but is still noticing some spotting.

## 2019-01-04 ENCOUNTER — Encounter: Payer: Self-pay | Admitting: Obstetrics and Gynecology

## 2019-01-04 ENCOUNTER — Other Ambulatory Visit: Payer: Self-pay

## 2019-01-04 ENCOUNTER — Ambulatory Visit: Payer: BC Managed Care – PPO | Admitting: Obstetrics and Gynecology

## 2019-01-04 VITALS — BP 130/81 | HR 65 | Ht 62.0 in | Wt 197.1 lb

## 2019-01-04 DIAGNOSIS — Z8742 Personal history of other diseases of the female genital tract: Secondary | ICD-10-CM

## 2019-01-04 DIAGNOSIS — Z9889 Other specified postprocedural states: Secondary | ICD-10-CM

## 2019-01-04 DIAGNOSIS — D251 Intramural leiomyoma of uterus: Secondary | ICD-10-CM

## 2019-01-04 DIAGNOSIS — D252 Subserosal leiomyoma of uterus: Secondary | ICD-10-CM

## 2019-01-04 DIAGNOSIS — Z79899 Other long term (current) drug therapy: Secondary | ICD-10-CM

## 2019-01-04 NOTE — Progress Notes (Signed)
    GYNECOLOGY PROGRESS NOTE  Subjective:    Patient ID: Lynn Boyd, female    DOB: December 09, 1985, 33 y.o.   MRN: [id number]  HPI  Patient is a 33 y.o. G0P0000 female who presents for 3 month follow up of medication.  Patient was started on Orilissa  after undergoing a robotic myomectomy.  Last period was 2 weeks ago, only lasted 2-3 days but did have some moderate cramping. Notes that she is having a watery discharge. Was treated by her PCP last month with Flagyl for suspected BV infection but patient notes she was only partially compliant with meds. Does note 20 lb weight gain since starting medication, but also has been under a lot of stress lately with school and has not been eating healthy. Also reports being more emotional on Orilissa.    The following portions of the patient's history were reviewed and updated as appropriate: allergies, current medications, past family history, past medical history, past social history, past surgical history and problem list.  Review of Systems Pertinent items noted in HPI and remainder of comprehensive ROS otherwise negative.   Objective:   Blood pressure 130/81, pulse 65, height 5\' 2"  (1.575 m), weight 197 lb 1.6 oz (89.4 kg). General appearance: alert and no distress Abdomen: soft, non-tender; bowel sounds normal; no masses,  no organomegaly. Moderate supraumbilical ventral hernia palpable.  Incisions well healed.  Pelvic: pelvic deferred.    Assessment:   1. Encounter for medication management   2. Intramural and subserous leiomyoma of uterus   3. History of endometriosis   4. History of myomectomy     Plan:   1. Continue use of Orilissa for management of uterine fibroids, DUB, and endometriosis.  Can continue at current dose (150 mg).  If bleeding or pain becomes persistent can increase back to 200 mg dosing.  2. RTC in 3-4 months for f/u [facility]. Will be due for 6 month f/u ultrasound. Also will be due for annual exam at  that time.     Rubie Maid, MD [facility]

## 2019-01-04 NOTE — Progress Notes (Signed)
Pt is present for a 2 month follow up after surgery. Pt stated that she was doing well no problems.

## 2019-01-17 ENCOUNTER — Telehealth: Payer: Self-pay

## 2019-01-17 NOTE — Telephone Encounter (Signed)
Spoke with pt and she is aware that her form is completed and ready for pick up, fax or emailed. Pt stated that she would like her form to be emailed to her. email [email or web address].

## 2019-01-17 NOTE — Telephone Encounter (Signed)
Pt calling to check status of forms completion. Please return call to pt.

## 2019-04-06 ENCOUNTER — Ambulatory Visit: Payer: Self-pay | Admitting: Podiatry

## 2019-05-02 ENCOUNTER — Other Ambulatory Visit: Payer: Self-pay

## 2019-05-02 ENCOUNTER — Ambulatory Visit (INDEPENDENT_AMBULATORY_CARE_PROVIDER_SITE_OTHER): Payer: BC Managed Care – PPO

## 2019-05-02 ENCOUNTER — Encounter: Payer: Self-pay | Admitting: Obstetrics and Gynecology

## 2019-05-02 ENCOUNTER — Ambulatory Visit: Payer: BC Managed Care – PPO | Admitting: Obstetrics and Gynecology

## 2019-05-02 VITALS — BP 144/87 | HR 74 | Ht 62.0 in | Wt 208.9 lb

## 2019-05-02 DIAGNOSIS — K439 Ventral hernia without obstruction or gangrene: Secondary | ICD-10-CM | POA: Diagnosis not present

## 2019-05-02 DIAGNOSIS — D252 Subserosal leiomyoma of uterus: Secondary | ICD-10-CM | POA: Diagnosis not present

## 2019-05-02 DIAGNOSIS — Z8742 Personal history of other diseases of the female genital tract: Secondary | ICD-10-CM

## 2019-05-02 DIAGNOSIS — D250 Submucous leiomyoma of uterus: Secondary | ICD-10-CM

## 2019-05-02 DIAGNOSIS — D251 Intramural leiomyoma of uterus: Secondary | ICD-10-CM

## 2019-05-02 DIAGNOSIS — Z9889 Other specified postprocedural states: Secondary | ICD-10-CM

## 2019-05-02 DIAGNOSIS — N7011 Chronic salpingitis: Secondary | ICD-10-CM

## 2019-05-02 DIAGNOSIS — Z79899 Other long term (current) drug therapy: Secondary | ICD-10-CM | POA: Diagnosis not present

## 2019-05-02 DIAGNOSIS — N83201 Unspecified ovarian cyst, right side: Secondary | ICD-10-CM

## 2019-05-02 MED ORDER — ORIAHNN 300-1-0.5 & 300 MG PO CPPK: 1.0000 | ORAL_CAPSULE | Freq: Two times a day (BID) | ORAL | 12 refills | Status: DC

## 2019-05-02 NOTE — Patient Instructions (Signed)
Elagolix; Estradiol; Norethindrone acetate oral capsules What is this medicine? ELAGOLIX; ESTRADIOL; NORETHINDRONE (el a GOE lix; es tra DYE ole; nor eth IN drone) is used to treat heavy menstrual bleeding due to uterine fibroids. This medicine may be used for other purposes; ask your health care provider or pharmacist if you have questions. COMMON BRAND NAME(S): ORIAHNN What should I tell my health care provider before I take this medicine? They need to know if you have any of these conditions:  blood vessel disease or blood clots  breast, cervical, endometrial, or uterine cancer  cigarette smoker  diabetes  gallbladder disease  heart disease  high blood pressure  high cholesterol  kidney disease  liver disease  lupus  migraine headaches  mental illness  osteoporosis  porphyria  stroke  suicidal thoughts, plans, or attempt; a previous suicide attempt by you or a family member  unexplained vaginal bleeding  an unusual or allergic reaction to elagolix, estrogens, progestins, other medicines, foods, dyes, or preservatives  pregnant or trying to get pregnant  breast-feeding How should I use this medicine? Take this medicine by mouth with a glass of water. You can take it with or without food. Follow the directions on the prescription label. Take this medicine at the same times each day. Do not take your medicine more often than directed. A special MedGuide will be given to you by the pharmacist with each prescription and refill. Be sure to read this information carefully each time. Talk to your pediatrician regarding the use of this medicine in children. This medicine is not approved for use in children. Overdosage: If you think you have taken too much of this medicine contact a poison control center or emergency room at once. NOTE: This medicine is only for you. Do not share this medicine with others. What if I miss a dose? If you miss a dose, take it within 4  hours of the time that it was supposed to be taken and take the next dose at the usual time. If more than 4 hours have passed, skip the missed dose. The next dose should be taken at the usual time. Do not take double or extra doses. What may interact with this medicine? Do not take this medicine with any of the following medications:  aromatase inhibitors like aminoglutethimide, anastrozole, exemestane, letrozole, testolactone  cyclosporine  gemfibrozil This medicine may also interact with the following medications:  benzodiazepines  bosentan  bromocriptine  certain antivirals for HIV or hepatitis  certain medicines for seizures like carbamazepine, phenobarbital, phenytoin  cimetidine  citalopram  cyclosporine  dantrolene  digoxin  grapefruit juice  griseofulvin  hydrocortisone, cortisone, or prednisolone  isoniazid (INH)  medicines for diabetes  methadone  methotrexate  midazolam  mineral oil  omeprazole  other female hormones, like estrogens or progestins and birth control pills, patches, rings, or injections  raloxifene  rifabutin, rifampin, or rifapentine  rosuvastatin  tamoxifen  thyroid hormones  topiramate  tricyclic antidepressants  warfarin This list may not describe all possible interactions. Give your health care provider a list of all the medicines, herbs, non-prescription drugs, or dietary supplements you use. Also tell them if you smoke, drink alcohol, or use illegal drugs. Some items may interact with your medicine. What should I watch for while using this medicine? Visit your doctor or health care professional for regular checks on your progress. You will need a regular breast and pelvic exam while on this medicine. It may take several cycles of use to   see improvement in your condition. You may have a change in bleeding pattern or irregular periods. Many females stop having periods while taking this drug. Smoking increases the  risk of getting a blood clot or having a stroke while you are taking this medicine, especially if you are more than 35 years old. You are strongly advised not to smoke. This medicine can make your body retain fluid, making your fingers, hands, or ankles swell. Your blood pressure can go up. Contact your doctor or health care professional if you feel you are retaining fluid. This medicine may cause weak bones (osteoporosis). Only use this product for the amount of time your health care professional tells you to. The longer you use this product the more likely you will be at risk for weak bones. Ask your health care professional how you can keep strong bones. This medicine does not prevent pregnancy. Women must use effective birth control with this medicine. Use a non-hormonal form of birth control while taking this medicine and for 1 week after stopping it. Talk to your health care professional about how to prevent pregnancy. Do not become pregnant while taking this medicine. Women should inform their doctor if they wish to become pregnant or think they might be pregnant. There is a potential for serious side effects to an unborn child. Talk to your health care professional or pharmacist for more information. If you are going to have elective surgery, you may need to stop taking this medicine beforehand. Consult your health care professional for advice prior to scheduling the surgery. If you wear contact lenses and notice visual changes, or if the lenses begin to feel uncomfortable, consult your eye care specialist. This medicine does not protect you against HIV infection (AIDS) or any other sexually transmitted diseases. What side effects may I notice from receiving this medicine? Side effects that you should report to your doctor or health care professional as soon as possible:  allergic reactions like skin rash, itching or hives, swelling of the face, lips, or tongue  anxious  breast tissue changes  or discharge  breathing problems  depressed mood  high blood pressure  signs and symptoms of a blood clot such as chest pain; shortness of breath; pain, swelling, or warmth in the leg  signs and symptoms of a stroke like changes in vision; confusion; trouble speaking or understanding; severe headaches; sudden numbness or weakness of the face, arm or leg; trouble walking; dizziness; loss of balance or coordination  signs and symptoms of liver injury like dark yellow or brown urine; general ill feeling or flu-like symptoms; light-colored stools; loss of appetite; nausea; right upper belly pain; unusually weak or tired; yellowing of the eyes or skin  suicidal thoughts or other mood changes  swelling of the ankles, feet, hands  unusual vaginal bleeding Side effects that usually do not require medical attention (report these to your doctor or health care professional if they continue or are bothersome):  reduced or absent menstrual periods  hair loss  headache  hot flashes or night sweats  nausea  tiredness This list may not describe all possible side effects. Call your doctor for medical advice about side effects. You may report side effects to FDA at 1-800-FDA-1088. Where should I keep my medicine? Keep out of the reach of children. Store at room temperature between 15 and 30 degrees C (59 and 86 degrees F). Throw away any unused medicine after the expiration date on the label. NOTE: This sheet is a   summary. It may not cover all possible information. If you have questions about this medicine, talk to your doctor, pharmacist, or health care provider.  2020 Elsevier/Gold Standard (2018-08-15 11:26:16)  

## 2019-05-02 NOTE — Progress Notes (Signed)
GYNECOLOGY PROGRESS NOTE  Subjective:    Patient ID: [name], female    DOB: 21-Jul-1985, 34 y.o.   MRN: [id number]  HPI  Patient is a 34 y.o. [place] female who presents for 3 month follow up of Freida Busman use for h/o endometriosis, fibroid uterus (s/p robotic myomectomy in June 2020) and abnormal bleeding.  She has been on the [place] since July 2020.  Patient reports that her cycles have been regular and light, however this month she has noticed more spotting outside of her cycle. Denies pelvic pain.   Shamane also reports that she thinks her hernia is getting worse.  She is feeling more of a bulge around her umbilicus as well as on the left. Also noting feeling something on her right abdomen near one of her previous incision sites.   The following portions of the patient's history were reviewed and updated as appropriate: allergies, current medications, past family history, past medical history, past social history, past surgical history and problem list.  Review of Systems Pertinent items noted in HPI and remainder of comprehensive ROS otherwise negative.   Objective:   Blood pressure (!) 144/87, pulse 74, height 5\' 2"  (1.575 m), weight 208 lb 14.4 oz (94.8 kg), last menstrual period 04/23/2019. General appearance: alert and no distress Abdomen: soft, non-tender; bowel sounds normal; no masses,  no organomegaly. Ventral hernia present with Valsalva, supraumbilical. Diastasis recti also present. No mass palpable on right lower abdomen.  Pelvic: deferred.    Imaging:  Patient Name: Liadan Piche DOB: 04-Jan-1986 MRN: [id number] ULTRASOUND REPORT  Location: [facility]  Date of Service: 05/02/2019     Indications:AUB/ 8 month post op. Findings:  The uterus is anteverted and measures 14 x 7.4 x 9.1 cm. Echo texture is heterogenous with evidence of focal masses. Within the uterus are multiple suspected fibroids measuring: Fibroid 1:SM 3.3 x 2.4 x 3.3 cm Fibroid  2:Anterior IM 4.8 x 3.3 x 3.8 cm Fibroid 3: Posterior IM 4.4 x 3.8x 3.8 cm  The Endometrium measures 3 mm.  Right Ovary measures 2.6 x 2.1 X 2.4 cm. It is normal in appearance.Rt ovarian 2.6 x 2.1 hypoechoic lesion with septation. Left Ovary measures 54.3 x 2.8 x 3.2 cm. It is normal in appearance. Left hydrosalpinx.  Survey of the adnexa demonstrates no adnexal masses. There is no free fluid in the cul de sac.  Impression: 1. Rt ovarian septated cyst 2. Lt hydrosalpinx 3. Enlarge fibroid uterus 4. Large SM fibroid 5. Free fluid mostly lt adnexal area.  Recommendations: 1.Clinical correlation with the patient's History and Physical Exam.   Jenine M. Albertine Grates    RDMS  Assessment:   Encounter for medication management Intramural and submucous leiomyoma of uterus History of endometriosis History of myomectomy Ventral hernia without obstruction or gangrene  Right ovarian cyst Hydrosalpinx  Plan:   1. Patient presents for medication management. Notes spotting this month which is unusual for her on the medication. Again discussed the option of changing from [place] to [place] to better help manage (and possibly shrink) her current fibroids and better management her bleeding. Patient now willing to try.  Will order.  2. Small fibroids present s/p myomectomy last year.  Unclear if these were fibroids that remained after prior surgery due to complexity of case. Or if new fibroids are developing. Will continue to monitor on new medication and perform [place] surveillance ever 3-6 months while on the medication.  3. Ventral wall hernia, patient has had for several years,  was hesitant to have repaired, however now is at the point where she knows she needs to do something. Ready to see a General Surgeon to discuss management. Referral placed.  4. Patient with history of persistent right ovarian cyst, asymptomatic. Has reduced in size since prior ultrasound however now with septation. Will  continue to monitor.  5. Left hydrosalpinx, stable.   RTC in 3 months for f/u of new medication and symptoms.    Rubie Maid, MD [facility]

## 2019-05-02 NOTE — Progress Notes (Signed)
Pt present for follow up u/s and [place] review. Pt stated that the Lynn Boyd is working wonderful. Pt stated having her cycle on 04/20/19 that was light and lasted longer than her other cycles.

## 2019-05-04 ENCOUNTER — Telehealth: Payer: Self-pay | Admitting: Obstetrics and Gynecology

## 2019-05-04 NOTE — Telephone Encounter (Signed)
I had a representative call from my [employer] saying that the patient is missing  sections 1,2, or 3 and they need proof of insurance and income so that this patient can get assistance affording her medication. Could you please give them a call back at [phone] and their fax is [phone]  -TC

## 2019-05-09 ENCOUNTER — Telehealth: Payer: Self-pay | Admitting: Obstetrics and Gynecology

## 2019-05-10 ENCOUNTER — Ambulatory Visit (INDEPENDENT_AMBULATORY_CARE_PROVIDER_SITE_OTHER): Payer: BC Managed Care – PPO | Admitting: Surgery

## 2019-05-10 ENCOUNTER — Encounter: Payer: Self-pay | Admitting: Surgery

## 2019-05-10 ENCOUNTER — Other Ambulatory Visit: Payer: Self-pay

## 2019-05-10 VITALS — BP 143/99 | HR 62 | Temp 98.1°F | Resp 14 | Ht 62.0 in | Wt 206.0 lb

## 2019-05-10 DIAGNOSIS — K439 Ventral hernia without obstruction or gangrene: Secondary | ICD-10-CM

## 2019-05-10 NOTE — Progress Notes (Signed)
05/10/2019  Reason for Visit:  Ventral hernia  Referring Provider:  Rubie Maid, MD  History of Present Illness: Lynn Boyd is a 34 y.o. female presenting for evaluation of a ventral hernia.  The patient has a long history of endometriosis and uterine fibroids, as well as tubo-ovarian abscess, s/p percutaneous drainage.  Patient reports she had surgery in 2012 for fibroids, and just recently had surgery with Dr. Marcelline Mates in 08/2018 where she did a robotic assisted laparoscopic myomectomy and lysis of adhesions.  The patient has a history of a ventral hernia, supraumbilical location, for probably about 5 years or more.  At first, she reports that she noticed something bulging and she had just been started on a diuretic, and thought may be related to that.  However, she was later diagnosed with a hernia.  The hernia had not been causing any troubles in the past, but more recently reports that the bulging is larger and that sometimes it's harder to push in.  Denies any nausea or vomiting, denies any persistent/constant pain in the hernia area, constipation or diarrhea.  She also mentions that the right latera incision from her surgery last year feels different, sometimes "squeashy" like fluid, and is unsure if there is a hernia there or not.  Reports that she's doing much better from the fibroid standpoint, and the medication she's on now is working well, and she's going to start a new medication when insurance authorization is given.  Past Medical History: Past Medical History:  Diagnosis Date  . Cardiomyopathy ([place])    HYPERTROPHIC  . Chronic blood loss anemia    iron infusions  . Endometriosis   . Enlarged heart   . Fibroid uterus   . Heart murmur   . Herpes simplex virus (HSV) infection   . History of blood transfusion 2016   2 units, given for chronic blood loss anemia   . Hx MRSA infection    wound that required a drain  . Hypertension   . Sleep apnea    OSA  . TOA (tubo-ovarian  abscess) 11/28/2014  . Vaginal Pap smear, abnormal 2017   ASCUS with HPV+     Past Surgical History: Past Surgical History:  Procedure Laterality Date  . endometreoisis     surgery  . IR IMAGE GUIDED DRAINAGE BY PERCUTANEOUS CATHETER  2016   left tubo-ovarian abscess, performed at [place]  05/2010   [facility]  . ROBOT ASSISTED MYOMECTOMY N/A 08/24/2018   Procedure: ROBOTIC ASSISTED MYOMECTOMY;  Surgeon: Rubie Maid, MD;  Location: [facility] OR[facility];  Service: Gynecology;  Laterality: N/A;  . ROBOTIC ASSISTED LAPAROSCOPIC LYSIS OF ADHESION N/A 08/24/2018   Procedure: ROBOTIC ASSISTED LAPAROSCOPIC LYSIS OF ADHESION;  Surgeon: Rubie Maid, MD;  Location: [facility] ORS;  Service: Gynecology;  Laterality: N/A;    Home Medications: Prior to Admission medications   Medication Sig Start Date End Date Taking? Authorizing Provider  acetaminophen (TYLENOL) 500 MG tablet Take 2 tablets (1,000 mg total) by mouth every 8 (eight) hours as needed for mild pain. 08/26/18  Yes Rubie Maid, MD  azelastine (ASTELIN) 0.1 % nasal spray Place 1 spray into both nostrils daily as needed for rhinitis. Use in each nostril as directed   Yes [provider]  cholecalciferol (VITAMIN D3) 25 MCG (1000 UT) tablet Take 1,000 Units by mouth daily.    Yes [provider]  Clindamycin-Benzoyl Per, Refr, gel Apply 1 application topically daily. 04/24/19  Yes [provider]  clindamycin-benzoyl peroxide (BENZACLIN) gel Apply 1 application topically daily.  08/15/17  Yes [provider]  docusate sodium (COLACE) 100 MG capsule Take 1 capsule (100 mg total) by mouth 2 (two) times daily as needed. 08/26/18  Yes Rubie Maid, MD  Elagolix Sodium (ORILISSA) 150 MG TABS Take 150 mg by mouth daily. 08/31/18  Yes Rubie Maid, MD  Elagolix-[facility]str[facility]) 300-1-0.5 & 300 MG CPPK Take 1 tablet by mouth in the morning and at bedtime. 05/02/19  Yes Rubie Maid, MD   ferrous sulfate 325 (65 FE) MG EC tablet Take 325 mg by mouth daily with breakfast.    Yes [provider]  metoprolol tartrate (LOPRESSOR) 50 MG tablet Take 50 mg by mouth daily.   Yes [provider]  naproxen sodium (ANAPROX) 220 MG tablet Take 440 mg by mouth every 8 (eight) hours as needed (pain).    Yes [provider]  simethicone (MYLICON) 80 MG chewable tablet Chew 1 tablet (80 mg total) by mouth 4 (four) times daily as needed for flatulence. 08/26/18  Yes Rubie Maid, MD  spironolactone (ALDACTONE) 25 MG tablet Take 50 mg by mouth daily.    Yes [provider]  tretinoin (RETIN-A) 0.025 % cream Apply 1 application topically at bedtime.    Yes [provider]  valACYclovir (VALTREX) 500 MG tablet Take 500 mg by mouth 2 (two) times daily as needed (flare-ups).   Yes [provider]    Allergies: Allergies  Allergen Reactions  . Bactrim [Sulfamethoxazole-Trimethoprim] Swelling  . Latex Rash    Social History:  reports that she has never smoked. She has never used smokeless tobacco. She reports current alcohol use. She reports that she does not use drugs.   Family History: Family History  Problem Relation Age of Onset  . Hypertension Mother   . Diabetes Mother   . Hypertension Father     Review of Systems: Review of Systems  Constitutional: Negative for chills and fever.  HENT: Negative for hearing loss.   Respiratory: Negative for shortness of breath.   Cardiovascular: Negative for chest pain.  Gastrointestinal: Negative for abdominal pain, nausea and vomiting.  Genitourinary: Negative for dysuria.  Musculoskeletal: Negative for myalgias.  Skin: Negative for rash.  Neurological: Negative for dizziness.  Psychiatric/Behavioral: Negative for depression.    Physical Exam BP (!) 143/99   Pulse 62   Temp 98.1 F (36.7 C)   Resp 14   Ht 5\' 2"  (1.575 m)   Wt 206 lb (93.4 kg)   LMP 04/23/2019   SpO2 98%   BMI  37.68 kg/m  CONSTITUTIONAL: No acute distress HEENT:  Normocephalic, atraumatic, extraocular motion intact. NECK: Trachea is midline, and there is no jugular venous distension.  RESPIRATORY:  Lungs are clear, and breath sounds are equal bilaterally. Normal respiratory effort without pathologic use of accessory muscles. CARDIOVASCULAR: Heart is regular without murmurs, gallops, or rubs. GI: The abdomen is soft, non-distended, currently non-tender to palpation.  The patient has diastasis recti and also a supraumbilical hernia.  Unclear if it is two hernias just next to each other, or if it is a large one.  Her right lateral port incision is well healed, and I do not palpate a hernia defect.  Her other incisions are well healed.  MUSCULOSKELETAL:  Normal muscle strength and tone in all four extremities.  No peripheral edema or cyanosis. SKIN: Skin turgor is normal. There are no pathologic skin lesions.  NEUROLOGIC:  Motor and sensation is  grossly normal.  Cranial nerves are grossly intact. PSYCH:  Alert and oriented to person, place and time. Affect is normal.  Laboratory Analysis: No results found for this or any previous visit (from the past 24 hour(s)).  Imaging: No results found.  Assessment and Plan: This is a 34 y.o. female with a ventral hernia.  --Discussed with the patient that it would be useful to obtain a new CT scan of the abdomen/pelvis.  This will help [place] more clearly identify her hernia defect and the extent if any of loss of domain.  This will also help [place] evaluate the right lateral incision to see if there is a hernia there as well or not. --She will follow up with me after CT scan is done to review the results and discuss surgery and timing.  Briefly discussed with her the options for open surgery vs laparoscopic, and this would in part depend on the size/extent of her hernia. --Also discussed with her return precautions, particularly symptoms of strangulated hernia, and that  she should come to ER immediately as this could be an emergency type of scenario.  She understands this and all of her questions have been answered.  Face-to-face time spent with the patient and care providers was 40 minutes, with more than 50% of the time spent counseling, educating, and coordinating care of the patient.     Melvyn Neth, [facility]

## 2019-05-10 NOTE — Patient Instructions (Addendum)
We will get you scheduled for a CT abdomen pelvis with contrast to assess the area.   We will call you with this appointment.  We will have you follow up with Dr Hampton Abbot after this study.      Hernia, Adult     A hernia happens when tissue inside your body pushes out through a weak spot in your belly muscles (abdominal wall). This makes a round lump (bulge). The lump may be:  In a scar from surgery that was done in your belly (incisional hernia).  Near your belly button (umbilical hernia).  In your groin (inguinal hernia). Your groin is the area where your leg meets your lower belly (abdomen). This kind of hernia could also be: ? In your scrotum, if you are female. ? In folds of skin around your vagina, if you are female.  In your upper thigh (femoral hernia).  Inside your belly (hiatal hernia). This happens when your stomach slides above the muscle between your belly and your chest (diaphragm). If your hernia is small and it does not cause pain, you may not need treatment. If your hernia is large or it causes pain, you may need surgery. Follow these instructions at home: Activity  Avoid stretching or overusing (straining) the muscles near your hernia. Straining can happen when you: ? Lift something heavy. ? Poop (have a bowel movement).  Do not lift anything that is heavier than 10 lb (4.5 kg), or the limit that you are told, until your doctor says that it is safe.  Use the strength of your legs when you lift something heavy. Do not use only your back muscles to lift. General instructions  Do these things if told by your doctor so you do not have trouble pooping (constipation): ? Drink enough fluid to keep your pee (urine) pale yellow. ? Eat foods that are high in fiber. These include fresh fruits and vegetables, whole grains, and beans. ? Limit foods that are high in fat and processed sugars. These include foods that are fried or sweet. ? Take medicine for trouble  pooping.  When you cough, try to cough gently.  You may try to push your hernia in by very gently pressing on it when you are lying down. Do not try to force the bulge back in if it will not push in easily.  If you are overweight, work with your doctor to lose weight safely.  Do not use any products that have nicotine or tobacco in them. These include cigarettes and e-cigarettes. If you need help quitting, ask your doctor.  If you will be having surgery (hernia repair), watch your hernia for changes in shape, size, or color. Tell your doctor if you see any changes.  Take over-the-counter and prescription medicines only as told by your doctor.  Keep all follow-up visits as told by your doctor. Contact a doctor if:  You get new pain, swelling, or redness near your hernia.  You poop fewer times in a week than normal.  You have trouble pooping.  You have poop (stool) that is more dry than normal.  You have poop that is harder or larger than normal. Get help right away if:  You have a fever.  You have belly pain that gets worse.  You feel sick to your stomach (nauseous).  You throw up (vomit).  Your hernia cannot be pushed in by very gently pressing on it when you are lying down. Do not try to force the bulge back  in if it will not push in easily.  Your hernia: ? Changes in shape or size. ? Changes color. ? Feels hard or it hurts when you touch it. These symptoms may represent a serious problem that is an emergency. Do not wait to see if the symptoms will go away. Get medical help right away. Call your local emergency services (911 in the [place] Summary  A hernia happens when tissue inside your body pushes out through a weak spot in the belly muscles. This creates a bulge.  If your hernia is small and it does not hurt, you may not need treatment. If your hernia is large or it hurts, you may need surgery.  If you will be having surgery, watch your hernia for changes in  shape, size, or color. Tell your doctor about any changes. This information is not intended to replace advice given to you by your health care provider. Make sure you discuss any questions you have with your health care provider. Document Revised: 06/16/2018 Document Reviewed: 11/25/2016 Elsevier Patient Education  [place].

## 2019-05-11 ENCOUNTER — Telehealth: Payer: Self-pay

## 2019-05-11 ENCOUNTER — Other Ambulatory Visit: Payer: Self-pay

## 2019-05-11 DIAGNOSIS — R1084 Generalized abdominal pain: Secondary | ICD-10-CM

## 2019-05-11 DIAGNOSIS — K439 Ventral hernia without obstruction or gangrene: Secondary | ICD-10-CM

## 2019-05-11 NOTE — Telephone Encounter (Signed)
Patient has been scheduled for a CT abdomen/pelvis with contrast at [facility] on 06/12/19 at 10:00 am. She will need to arrive there by 9:45 am. Prep: NPO 4 hours prior and pick up prep kit. Patient verbalizes understanding.  She will follow up with Dr Hampton Abbot after this as scheduled.

## 2019-05-11 NOTE — Telephone Encounter (Signed)
Spoke with [place] from [facility] concerning the missing information. Informed that Lynn Boyd needs was information that needed to given by the pt. They were calling to inform the provider that they had contacted the pt several times to get the information needed and still waiting on the pt reply. Forms has been mailed to the patient to be completed.

## 2019-05-12 ENCOUNTER — Telehealth: Payer: Self-pay | Admitting: Obstetrics and Gynecology

## 2019-05-12 NOTE — Telephone Encounter (Signed)
Pt called in and stated that she has some questions for the nurse for her meds Freida Busman). The pt is requesting a call back please advise

## 2019-05-16 NOTE — Telephone Encounter (Signed)
The pt called in and stated that she has more questions on her oriahnn. Pt is requesting a call back. Please advise

## 2019-05-18 NOTE — Telephone Encounter (Signed)
Spoke with Arbie Cookey concerning pt and her approval for [employer]. Arbie Cookey stated that she would look more into the pt's case to see why she has not heard anything from the company after mailing in her information.

## 2019-05-18 NOTE — Telephone Encounter (Signed)
Spoke with pt to see if she had heard anything from [facility]. Pt stated that she sent in her paperwork about a week ago. Pt was given number to contact [place] for help. Pt stated that she would call them today.

## 2019-05-18 NOTE — Telephone Encounter (Signed)
Spoke with pt via mychart

## 2019-05-25 ENCOUNTER — Telehealth: Payer: Self-pay

## 2019-05-25 NOTE — Telephone Encounter (Signed)
Lynn Boyd Repo called to check on the status of pt's medication Oriahnn. Lynn Boyd did not answer LM via VM to call the office to speak more about the pt and her medication.

## 2019-05-30 ENCOUNTER — Telehealth: Payer: Self-pay | Admitting: Obstetrics and Gynecology

## 2019-05-30 NOTE — Telephone Encounter (Signed)
Patient called in saying that she was denied for the medication assistance program. She would like to know what the next steps would be?

## 2019-05-31 NOTE — Telephone Encounter (Signed)
Pt is aware that I did another prior authorization and spoke to [facility] concerning her prior authorization for [place]. Was informed by [facility] that they were still reviewing the authorization.

## 2019-06-01 MED ORDER — ORIAHNN 300-1-0.5 & 300 MG PO CPPK: 1.0000 | ORAL_CAPSULE | Freq: Two times a day (BID) | ORAL | 12 refills | Status: DC

## 2019-06-01 NOTE — Addendum Note (Signed)
Addended by: Edwyna Shell on: 06/01/2019 11:46 AM   Modules accepted: Orders

## 2019-06-01 NOTE — Telephone Encounter (Signed)
Reviewed letter from [facility] with prior authorization information with approval of medication Oriahnn. Pt called and is aware that her medication Barbette Merino has been approved.

## 2019-06-06 ENCOUNTER — Telehealth: Payer: Self-pay | Admitting: Obstetrics and Gynecology

## 2019-06-06 NOTE — Telephone Encounter (Signed)
Arbie Cookey from [place] called  [phone]. She stated that the order for Barbette Merino was approved and her med card was too for this pt. Please advise

## 2019-06-08 NOTE — Telephone Encounter (Signed)
Error

## 2019-06-12 ENCOUNTER — Ambulatory Visit
Admission: RE | Admit: 2019-06-12 | Discharge: 2019-06-12 | Disposition: A | Discharge status: Home / Self Care | Payer: BC Managed Care – PPO | Source: Ambulatory Visit | Attending: Surgery | Admitting: Surgery

## 2019-06-12 ENCOUNTER — Other Ambulatory Visit: Payer: Self-pay

## 2019-06-12 DIAGNOSIS — K439 Ventral hernia without obstruction or gangrene: Secondary | ICD-10-CM | POA: Diagnosis present

## 2019-06-12 DIAGNOSIS — R1084 Generalized abdominal pain: Secondary | ICD-10-CM | POA: Diagnosis present

## 2019-06-12 LAB — POCT I-STAT CREATININE: Creatinine, Ser: 0.9 mg/dL (ref 0.44–1.00)

## 2019-06-12 MED ORDER — IOHEXOL 300 MG/ML  SOLN
100.0000 mL | Freq: Once | INTRAMUSCULAR | Status: AC | PRN
  Administered 2019-06-12: 10:00:00 100 mL via INTRAVENOUS

## 2019-06-13 ENCOUNTER — Telehealth: Payer: Self-pay | Admitting: Obstetrics and Gynecology

## 2019-06-13 NOTE — Telephone Encounter (Signed)
Pt called in and stated that she was told that oriahnn meds would be $5, but her pharmacy stated that it is $20. The pt is trying to see why its not $5. The pt is requesting a call back. Please advise

## 2019-06-13 NOTE — Telephone Encounter (Signed)
Spoke with Arbie Cookey concerning the pt calling the office stating that her medication was 20.00 and not 5.00 as informed. Was informed that pt should had gotten a letter to given to her pharmacy. Was given the information for the card. Will call pharmacy and given them the information.

## 2019-06-14 NOTE — Telephone Encounter (Signed)
Called pharmacy to give them the discount card numbers for the pt's discount. Was informed that the discount card could not be used with her insurance.

## 2019-06-14 NOTE — Telephone Encounter (Signed)
Spoke with pt and informed her that I had spoke with the pharmacy.  Pt stated that she had spoke to the pharmacy as well. Pt stated that her medication has been fixed and it is now $5.00.

## 2019-06-16 ENCOUNTER — Other Ambulatory Visit: Payer: Self-pay | Admitting: Surgery

## 2019-06-16 ENCOUNTER — Encounter: Payer: Self-pay | Admitting: Surgery

## 2019-06-16 ENCOUNTER — Ambulatory Visit: Payer: BC Managed Care – PPO | Admitting: Surgery

## 2019-06-16 ENCOUNTER — Other Ambulatory Visit: Payer: Self-pay

## 2019-06-16 VITALS — BP 165/116 | HR 85 | Temp 97.7°F | Resp 12 | Ht 62.0 in | Wt 212.4 lb

## 2019-06-16 DIAGNOSIS — K439 Ventral hernia without obstruction or gangrene: Secondary | ICD-10-CM

## 2019-06-16 DIAGNOSIS — R1900 Intra-abdominal and pelvic swelling, mass and lump, unspecified site: Secondary | ICD-10-CM

## 2019-06-16 NOTE — Patient Instructions (Signed)
You will be scheduled for a CT Biopsy. Once we get this approved and have an appointment for you I will call you with appointment dates. At that time we will also schedule a follow up visit with Dr Hampton Abbot after your CT Biopsy to discuss results.   Continue walking/running as you are doing. Just avoid doing sit ups or doing heavy weights for the mean time.   Call the office if you have any questions or concerns.   Ventral Hernia  A ventral hernia is a bulge of tissue from inside the abdomen that pushes through a weak area of the muscles that form the front wall of the abdomen. The tissues inside the abdomen are inside a sac (peritoneum). These tissues include the small intestine, large intestine, and the fatty tissue that covers the intestines (omentum). Sometimes, the bulge that forms a hernia contains intestines. Other hernias contain only fat. Ventral hernias do not go away without surgical treatment. There are several types of ventral hernias. You may have:  A hernia at an incision site from previous abdominal surgery (incisional hernia).  A hernia just above the belly button (epigastric hernia), or at the belly button (umbilical hernia). These types of hernias can develop from heavy lifting or straining.  A hernia that comes and goes (reducible hernia). It may be visible only when you lift or strain. This type of hernia can be pushed back into the abdomen (reduced).  A hernia that traps abdominal tissue inside the hernia (incarcerated hernia). This type of hernia does not reduce.  A hernia that cuts off blood flow to the tissues inside the hernia (strangulated hernia). The tissues can start to die if this happens. This is a very painful bulge that cannot be reduced. A strangulated hernia is a medical emergency. What are the causes? This condition is caused by abdominal tissue putting pressure on an area of weakness in the abdominal muscles. What increases the risk? The following factors  may make you more likely to develop this condition:  Being female.  Being 69 or older.  Being overweight or obese.  Having had previous abdominal surgery, especially if there was an infection after surgery.  Having had an injury to the abdominal wall.  Having had several pregnancies.  Having a buildup of fluid inside the abdomen (ascites). What are the signs or symptoms? The only symptom of a ventral hernia may be a painless bulge in the abdomen. A reducible hernia may be visible only when you strain, cough, or lift. Other symptoms may include:  Dull pain.  A feeling of pressure. Signs and symptoms of a strangulated hernia may include:  Increasing pain.  Nausea and vomiting.  Pain when pressing on the hernia.  The skin over the hernia turning red or purple.  Constipation.  Blood in the stool (feces). How is this diagnosed? This condition may be diagnosed based on:  Your symptoms.  Your medical history.  A physical exam. You may be asked to cough or strain while standing. These actions increase the pressure inside your abdomen and force the hernia through the opening in your muscles. Your health care provider may try to reduce the hernia by pressing on it.  Imaging studies, such as an ultrasound or CT scan. How is this treated? This condition is treated with surgery. If you have a strangulated hernia, surgery is done as soon as possible. If your hernia is small and not incarcerated, you may be asked to lose some weight before surgery. Follow  these instructions at home:  Follow instructions from your health care provider about eating or drinking restrictions.  If you are overweight, your health care provider may recommend that you increase your activity level and eat a healthier diet.  Do not lift anything that is heavier than 10 lb (4.5 kg).  Return to your normal activities as told by your health care provider. Ask your health care provider what activities are safe  for you. You may need to avoid activities that increase pressure on your hernia.  Take over-the-counter and prescription medicines only as told by your health care provider.  Keep all follow-up visits as told by your health care provider. This is important. Contact a health care provider if:  Your hernia gets larger.  Your hernia becomes painful. Get help right away if:  Your hernia becomes increasingly painful.  You have pain along with any of the following: ? Changes in skin color in the area of the hernia. ? Nausea. ? Vomiting. ? Fever. Summary  A ventral hernia is a bulge of tissue from inside the abdomen that pushes through a weak area of the muscles that form the front wall of the abdomen.  This condition is treated with surgery, which may be urgent depending on your hernia.  Do not lift anything that is heavier than 10 lb (4.5 kg), and follow activity instructions from your health care provider. This information is not intended to replace advice given to you by your health care provider. Make sure you discuss any questions you have with your health care provider. Document Revised: 04/07/2017 Document Reviewed: 09/14/2016 Elsevier Patient Education  [place].

## 2019-06-16 NOTE — Progress Notes (Signed)
06/16/2019  History of Present Illness: Lynn Boyd is a 34 y.o. female presenting for follow-up of ventral hernia.  She was originally seen on 05/10/2019 for initial consult for a ventral hernia at the umbilical area.  She has a history of robotic assisted myomectomy 2012 as well as 2020.  At that point, a CT scan was ordered to evaluate her anatomy better and evaluate the size of her hernia defect.  CT scan found that she actually has 2 hernias that are located at the supraumbilical level as well as the right lower quadrant at the prior port site.  The hernia at the supraumbilical level is fat-containing and the 1 in the right lower quadrant contains small bowel but there is no evidence of obstruction from the hernia.  However the CT scan also shows an enlarging mass in the right upper quadrant measuring 12.5 x 8.7 x 9.9 cm, previously approximately 5.6 x 4.7 in 2016.  The etiology of this mass is unclear but this could potentially be a peritoneal implant from a previous leiomyoma morcellation.  However this could potentially be other primary neoplasms of the mesentery.  The patient today reports that she has been doing well.  She does report feeling gassy at times and bloated.  Denies any nausea or vomiting.  Denies any worsening abdominal pain.  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  Denies any fevers, chills, chest pain, shortness of breath, constipation.  Past Medical History: Past Medical History:  Diagnosis Date  . Cardiomyopathy ([place])    HYPERTROPHIC  . Chronic blood loss anemia    iron infusions  . Endometriosis   . Enlarged heart   . Fibroid uterus   . Heart murmur   . Herpes simplex virus (HSV) infection   . History of blood transfusion 2016   2 units, given for chronic blood loss anemia   . Hx MRSA infection    wound that required a drain  . Hypertension   . Sleep apnea    OSA  . TOA  (tubo-ovarian abscess) 11/28/2014  . Vaginal Pap smear, abnormal 2017   ASCUS with HPV+     Past Surgical History: Past Surgical History:  Procedure Laterality Date  . endometreoisis     surgery  . IR IMAGE GUIDED DRAINAGE BY PERCUTANEOUS CATHETER  2016   left tubo-ovarian abscess, performed at [place]  05/2010   [facility]  . ROBOT ASSISTED MYOMECTOMY N/A 08/24/2018   Procedure: ROBOTIC ASSISTED MYOMECTOMY;  Surgeon: Rubie Maid, MD;  Location: [facility] ORS;  Service: Gynecology;  Laterality: N/A;  . ROBOTIC ASSISTED LAPAROSCOPIC LYSIS OF ADHESION N/A 08/24/2018   Procedure: ROBOTIC ASSISTED LAPAROSCOPIC LYSIS OF ADHESION;  Surgeon: Rubie Maid, MD;  Location: [facility] ORS;  Service: Gynecology;  Laterality: N/A;    Home Medications: Prior to Admission medications   Medication Sig Start Date End Date Taking? Authorizing Provider  acetaminophen (TYLENOL) 500 MG tablet Take 2 tablets (1,000 mg total) by mouth every 8 (eight) hours as needed for mild pain. 08/26/18  Yes Rubie Maid, MD  azelastine (ASTELIN) 0.1 % nasal spray Place 1 spray into both nostrils daily as needed for rhinitis. Use in each nostril as directed   Yes [provider]  cholecalciferol (VITAMIN D3) 25 MCG (1000 UT) tablet Take 1,000 Units by mouth daily.    Yes [provider]  Clindamycin-Benzoyl Per, Refr, gel Apply 1 application topically daily. 04/24/19  Yes [provider]  clindamycin-benzoyl peroxide (BENZACLIN) gel Apply 1 application topically daily.  08/15/17  Yes [provider]  docusate sodium (COLACE) 100 MG capsule Take 1 capsule (100 mg total) by mouth 2 (two) times daily as needed. 08/26/18  Yes Rubie Maid, MD  Elagolix Sodium (ORILISSA) 150 MG TABS Take 150 mg by mouth daily. 08/31/18  Yes Rubie Maid, MD  Elagolix-Estrad-[place] & [facility]) 300-1-0.5 & 300 MG CPPK Take 1 tablet by mouth in the morning and at bedtime. 06/01/19  Yes Rubie Maid, MD  ferrous sulfate 325 (65 FE) MG EC tablet Take 325 mg by mouth daily with breakfast.    Yes [provider]  metoprolol succinate (TOPROL-XL) 50 MG 24 hr tablet Take 50 mg by mouth daily. 05/11/19  Yes [provider]  metoprolol tartrate (LOPRESSOR) 50 MG tablet Take 50 mg by mouth daily.   Yes [provider]  naproxen sodium (ANAPROX) 220 MG tablet Take 440 mg by mouth every 8 (eight) hours as needed (pain).    Yes [provider]  simethicone (MYLICON) 80 MG chewable tablet Chew 1 tablet (80 mg total) by mouth 4 (four) times daily as needed for flatulence. 08/26/18  Yes Rubie Maid, MD  spironolactone (ALDACTONE) 25 MG tablet Take 50 mg by mouth daily.    Yes [provider]  tretinoin (RETIN-A) 0.025 % cream Apply 1 application topically at bedtime.    Yes [provider]  valACYclovir (VALTREX) 500 MG tablet Take 500 mg by mouth 2 (two) times daily as needed (flare-ups).   Yes [provider]    Allergies: Allergies  Allergen Reactions  . Bactrim [Sulfamethoxazole-Trimethoprim] Swelling  . Latex Rash    Review of Systems: Review of Systems  Constitutional: Negative for chills and fever.  Respiratory: Negative for shortness of breath.   Cardiovascular: Negative for chest pain.  Gastrointestinal: Positive for abdominal pain (discomfort at the hernia sites, not worsening). Negative for constipation, diarrhea, nausea and vomiting.    Physical Exam BP (!) 165/116   Pulse 85   Temp 97.7 F (36.5 C)   Resp 12   Ht 5\' 2"  (1.575 m)   Wt 212 lb 6.4 oz (96.3 kg)   SpO2 98%   BMI 38.85 kg/m  CONSTITUTIONAL: No acute distress HEENT:  Normocephalic, atraumatic, extraocular motion intact. RESPIRATORY:  Lungs are clear, and breath sounds are equal bilaterally. Normal respiratory effort without pathologic use of accessory muscles. CARDIOVASCULAR: Heart is regular without murmurs, gallops, or rubs. GI: The abdomen is  soft, obese, nondistended, currently nontender to palpation.  Patient has 2 reducible hernias, one that is located at the supraumbilical level in the midline and the other one in the right lower quadrant at the site of a previous port.  There are some discomfort while reducing but they are both easily reducible.  There is a palpable mass in the right upper quadrant as noted on her CT scan. NEUROLOGIC:  Motor and sensation is grossly normal.  Cranial nerves are grossly intact. PSYCH:  Alert and oriented to person, place and time. Affect is normal.  Labs/Imaging: CT scan abdomen and pelvis on 06/12/2019: IMPRESSION: 1. Interval development of a right lower quadrant hernia perhaps the site of previous laparoscopic access, containing bowel and without signs of obstruction. 2. Diffuse mesenteric thickening indicative of peritoneal disease in this patient with reported history of endometriosis. 3. 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 Considerable enlargement since 2016. Correlate with any history of biopsy or surgery in this area since that time for any pathology results which may exist. Finding is not typical or suggestive of endometrioma. Malignant transformation of leiomyoma and or deposit in the peritoneum is also considered in addition to other primary neoplasms of the abdominal mesentery. Blood supply is derived from upper abdominal collaterals primarily. Findings related to prior myomectomy with presumed degeneration of fibroids in the uterus as described. No frankly malignant features are noted within the uterus though comparison with previous, interval imaging from [facility] and [facility] [place] may be helpful. 4. Cystic changes in the left adnexa, nonspecific. Consider correlation with ultrasound for pelvic findings or prior imaging as possible. 5. Pelvic ascites and mesenteric and peritoneal thickening. 6. Question mild hepatic  steatosis.  Assessment and Plan: This is a 34 y.o. female with 2 incisional hernias as well as uterine fibroids and an enlarging right upper quadrant mass of unclear etiology.  -Discussed with the patient that from the hernia standpoint alone, we would be able to offer her a robotic assisted incisional hernia repair with mesh.  However at this point given the enlarging right upper quadrant mass, we would need to find out what this is first before proceeding with any surgical interventions.  I recommended that we obtain an image guided biopsy with interventional radiology to find out what this mass truly is.  Depending on the pathology results, we could further discuss this with Dr. Marcelline Mates to see if this is a fibroid that could be removed versus need to refer her to a tertiary center if this is something more worrisome. -We will order the biopsy and once we know the date will call the patient with that information.  We will also set up a follow-up appointment based on the date for the biopsy so that we can know the results prior to visit. -Patient understands this plan and all of her questions have been answered.  I encouraged her to continue to stay active and continue walking as losing weight can only help for any future surgery.  Face-to-face time spent with the patient and care providers was 25 minutes, with more than 50% of the time spent counseling, educating, and coordinating care of the patient.     Melvyn Neth, [facility]

## 2019-06-19 ENCOUNTER — Telehealth: Payer: Self-pay | Admitting: Emergency Medicine

## 2019-06-19 NOTE — Telephone Encounter (Signed)
Pt needs to be aware of Biopsy appointment:  [facility] 7:30[facility]. Pt is to expect a call from a nurse to explain the procedure and any further instructions.   Called pt with no answer at this time. Unable to leave vm due to vm box being full.

## 2019-06-19 NOTE — Telephone Encounter (Signed)
Follow up appt with Dr Hampton Abbot 06/28/19 at 9:45am.

## 2019-06-19 NOTE — Telephone Encounter (Signed)
Pt made aware of appointments. No further questions.

## 2019-06-21 ENCOUNTER — Other Ambulatory Visit: Payer: Self-pay | Admitting: Radiology

## 2019-06-22 ENCOUNTER — Other Ambulatory Visit: Payer: Self-pay | Admitting: Radiology

## 2019-06-23 ENCOUNTER — Ambulatory Visit
Admission: RE | Admit: 2019-06-23 | Discharge: 2019-06-23 | Disposition: A | Discharge status: Home / Self Care | Payer: BC Managed Care – PPO | Source: Ambulatory Visit | Attending: Surgery | Admitting: Surgery

## 2019-06-23 ENCOUNTER — Other Ambulatory Visit: Payer: Self-pay

## 2019-06-23 DIAGNOSIS — R1900 Intra-abdominal and pelvic swelling, mass and lump, unspecified site: Secondary | ICD-10-CM

## 2019-06-23 LAB — PROTIME-INR
INR: 1 (ref 0.8–1.2)
Prothrombin Time: 13.5 seconds (ref 11.4–15.2)

## 2019-06-23 LAB — CBC
HCT: 39.8 % (ref 36.0–46.0)
Hemoglobin: 13.3 g/dL (ref 12.0–15.0)
MCH: 27.9 pg (ref 26.0–34.0)
MCHC: 33.4 g/dL (ref 30.0–36.0)
MCV: 83.6 fL (ref 80.0–100.0)
Platelets: 191 10*3/uL (ref 150–400)
RBC: 4.76 MIL/uL (ref 3.87–5.11)
RDW: 15.2 % (ref 11.5–15.5)
WBC: 3.8 10*3/uL — ABNORMAL LOW (ref 4.0–10.5)
nRBC: 0 % (ref 0.0–0.2)

## 2019-06-23 LAB — PREGNANCY, URINE: Preg Test, Ur: NEGATIVE

## 2019-06-23 MED ORDER — FENTANYL CITRATE (PF) 100 MCG/2ML IJ SOLN
INTRAMUSCULAR | Status: AC
  Filled 2019-06-23: qty 2

## 2019-06-23 MED ORDER — SODIUM CHLORIDE 0.9 % IV SOLN: INTRAVENOUS | Status: DC

## 2019-06-23 MED ORDER — MIDAZOLAM HCL 5 MG/5ML IJ SOLN
INTRAMUSCULAR | Status: AC
  Filled 2019-06-23: qty 5

## 2019-06-23 MED ORDER — FENTANYL CITRATE (PF) 100 MCG/2ML IJ SOLN
INTRAMUSCULAR | Status: AC | PRN
  Administered 2019-06-23 (×2): 50 ug via INTRAVENOUS

## 2019-06-23 MED ORDER — MIDAZOLAM HCL 2 MG/2ML IJ SOLN
INTRAMUSCULAR | Status: AC | PRN
  Administered 2019-06-23 (×2): 1 mg via INTRAVENOUS

## 2019-06-23 NOTE — Progress Notes (Signed)
Patient clinically stable post Intraabdominal mass biopsy per Dr Annamaria Boots, tolerated well, bandade dressing dry and intact. Denies complaints at this time. Received Versed 2mg  along with Fentanyl 144mcg IV for procedure. Report given to Verita Lamb in specials with questions answered.

## 2019-06-23 NOTE — H&P (Signed)
Chief Complaint: Patient was seen in consultation today for right abdominal mass biopsy at the request of [place]  Referring Physician(s): Story  Supervising Physician: Daryll Brod  Patient Status: [facility] - Out-pt  History of Present Illness: Lynn Boyd is a 34 y.o. female being worked up for intra-abdominal mass found on CT originally in 2016 and now much larger on recent imaging. She is referred for image guided biopsy. PMHx, meds, labs, imaging, allergies reviewed. Feels well, no recent fevers, chills, illness. Has been NPO today as directed.   Past Medical History:  Diagnosis Date  . Cardiomyopathy ([place])    HYPERTROPHIC  . Chronic blood loss anemia    iron infusions  . Endometriosis   . Enlarged heart   . Fibroid uterus   . Heart murmur   . Herpes simplex virus (HSV) infection   . History of blood transfusion 2016   2 units, given for chronic blood loss anemia   . Hx MRSA infection    wound that required a drain  . Hypertension   . Sleep apnea    OSA, not currently using CPAP  . TOA (tubo-ovarian abscess) 11/28/2014  . Vaginal Pap smear, abnormal 2017   ASCUS with HPV+    Past Surgical History:  Procedure Laterality Date  . endometreoisis     surgery  . IR IMAGE GUIDED DRAINAGE BY PERCUTANEOUS CATHETER  2016   left tubo-ovarian abscess, performed at [place]  05/2010   [facility]  . ROBOT ASSISTED MYOMECTOMY N/A 08/24/2018   Procedure: ROBOTIC ASSISTED MYOMECTOMY;  Surgeon: Rubie Maid, MD;  Location: [facility] ORS;  Service: Gynecology;  Laterality: N/A;  . ROBOTIC ASSISTED LAPAROSCOPIC LYSIS OF ADHESION N/A 08/24/2018   Procedure: ROBOTIC ASSISTED LAPAROSCOPIC LYSIS OF ADHESION;  Surgeon: Rubie Maid, MD;  Location: [facility] ORS;  Service: Gynecology;  Laterality: N/A;    Allergies: Bactrim [sulfamethoxazole-trimethoprim] and Latex  Medications: Prior to Admission medications   Medication Sig Start Date End Date  Taking? Authorizing Provider  acetaminophen (TYLENOL) 500 MG tablet Take 2 tablets (1,000 mg total) by mouth every 8 (eight) hours as needed for mild pain. 08/26/18  Yes Rubie Maid, MD  cholecalciferol (VITAMIN D3) 25 MCG (1000 UT) tablet Take 1,000 Units by mouth daily.    Yes [provider]  Clindamycin-Benzoyl Per, Refr, gel Apply 1 application topically daily. 04/24/19  Yes [provider]  clindamycin-benzoyl peroxide (BENZACLIN) gel Apply 1 application topically daily.  08/15/17  Yes [provider]  Elagolix Sodium (ORILISSA) 150 MG TABS Take 150 mg by mouth daily. 08/31/18  Yes Rubie Maid, MD  Elagolix-Estrad-Noreth & Elago [facility]) 300-1-0.5 & 300 MG CPPK Take 1 tablet by mouth in the morning and at bedtime. 06/01/19  Yes Rubie Maid, MD  ferrous sulfate 325 (65 FE) MG EC tablet Take 325 mg by mouth daily with breakfast.    Yes [provider]  metoprolol succinate (TOPROL-XL) 50 MG 24 hr tablet Take 50 mg by mouth daily. 05/11/19  Yes [provider]  Scar Treatment Products ([place]) GEL Apply 1 application topically.   Yes [provider]  spironolactone (ALDACTONE) 25 MG tablet Take 50 mg by mouth daily.    Yes [provider]  tretinoin (RETIN-A) 0.025 % cream Apply 1 application topically at bedtime.    Yes [provider]  azelastine (ASTELIN) 0.1 % nasal spray Place 1 spray into both nostrils daily as needed for rhinitis. Use in each nostril as directed  [provider]  docusate sodium (COLACE) 100 MG capsule Take 1 capsule (100 mg total) by mouth 2 (two) times daily as needed. 08/26/18   Rubie Maid, MD  metoprolol tartrate (LOPRESSOR) 50 MG tablet Take 50 mg by mouth daily.    [provider]  naproxen sodium (ANAPROX) 220 MG tablet Take 440 mg by mouth every 8 (eight) hours as needed (pain).     [provider]  simethicone (MYLICON) 80 MG chewable tablet Chew 1 tablet (80 mg  total) by mouth 4 (four) times daily as needed for flatulence. 08/26/18   Rubie Maid, MD  valACYclovir (VALTREX) 500 MG tablet Take 500 mg by mouth 2 (two) times daily as needed (flare-ups).    [provider]     Family History  Problem Relation Age of Onset  . Hypertension Mother   . Diabetes Mother   . Hypertension Father     Social History   Socioeconomic History  . Marital status: Single    Spouse name: Not on file  . Number of children: Not on file  . Years of education: Not on file  . Highest education level: Not on file  Occupational History  . Occupation: Training and development officer: [employer]  Tobacco Use  . Smoking status: Never Smoker  . Smokeless tobacco: Never Used  Substance and Sexual Activity  . Alcohol use: Yes    Alcohol/week: 0.0 standard drinks    Comment: socially  . Drug use: No  . Sexual activity: Yes    Birth control/protection: None  Other Topics Concern  . Not on file  Social History Narrative  . Not on file   Social Determinants of Health   Financial Resource Strain:   . Difficulty of Paying Living Expenses:   Food Insecurity:   . Worried About Charity fundraiser in the Last Year:   . Arboriculturist in the Last Year:   Transportation Needs:   . Film/video editor (Medical):   Marland Kitchen Lack of Transportation (Non-Medical):   Physical Activity:   . Days of Exercise per Week:   . Minutes of Exercise per Session:   Stress:   . Feeling of Stress :   Social Connections:   . Frequency of Communication with Friends and Family:   . Frequency of Social Gatherings with Friends and Family:   . Attends Religious Services:   . Active Member of Clubs or Organizations:   . Attends Archivist Meetings:   Marland Kitchen Marital Status:     Review of Systems: A 12 point ROS discussed and pertinent positives are indicated in the HPI above.  All other systems are negative.  Review of Systems  Vital Signs: BP (!) 139/96   Pulse 77    Temp 97.7 F (36.5 C) (Oral)   Resp 17   Ht 5\' 2"  (1.575 m)   Wt 89.8 kg   SpO2 99%   BMI 36.21 kg/m   Physical Exam Constitutional:      Appearance: Normal appearance.  HENT:     Mouth/Throat:     Mouth: Mucous membranes are moist.     Pharynx: Oropharynx is clear.  Cardiovascular:     Rate and Rhythm: Normal rate and regular rhythm.     Heart sounds: Normal heart sounds.  Pulmonary:     Effort: Pulmonary effort is normal. No respiratory distress.     Breath sounds: Normal breath sounds.  Abdominal:     General:  Abdomen is flat.     Palpations: Abdomen is soft.     Tenderness: There is no abdominal tenderness.     Hernia: A hernia is present.     Comments: Hernia palpable and palpable 'fullness' in right mid abdomen  Skin:    General: Skin is warm and dry.  Neurological:     General: No focal deficit present.     Mental Status: She is alert and oriented to person, place, and time.  Psychiatric:        Mood and Affect: Mood normal.        Thought Content: Thought content normal.        Judgment: Judgment normal.     Imaging: CT Abdomen Pelvis W Contrast  Result Date: 06/12/2019 CLINICAL DATA:  Abdominal pain, hernia suspected. EXAM: CT ABDOMEN AND PELVIS WITH CONTRAST TECHNIQUE: Multidetector CT imaging of the abdomen and pelvis was performed using the standard protocol following bolus administration of intravenous contrast. CONTRAST:  151mL OMNIPAQUE IOHEXOL 300 MG/ML  SOLN COMPARISON:  CT 11/28/2014 FINDINGS: Lower chest: Incidental imaging of the lung bases is unremarkable. Hepatobiliary: No focal, intrahepatic lesion. Lobular hepatic contours with suggestion of background steatosis. Portal vein is patent. Gallbladder is unremarkable and there are no signs of biliary ductal dilation. See below for perihepatic/subhepatic mass. Pancreas: Pancreas is normal without focal lesion or ductal dilation. Spleen: Spleen is normal size without focal lesion. Adrenals/Urinary Tract:  Adrenal glands are normal. Smooth renal contours without evidence of hydronephrosis. Stomach/Bowel: Bowel loops displaced by a large mass in the right hemiabdomen measuring 12.5 x 8.7 x 9.9 cm previously approximately 5.6 x 4.7 cm in greatest axial dimension. Extensive collateral pathways in the upper abdomen appear to supply this mass at numerous locations including upper abdominal contributions. Bowel contained in a right rectus hernia/spigelian hernia. Aperture of the hernia is approximately 2 cm. Small amount of fluid adjacent to bowel loops. No sign of obstruction. The appendix is normal. Vascular/Lymphatic: Patent abdominal vasculature. No adenopathy. No pelvic lymphadenopathy. Reproductive: Defect in the uterine fundus from previous myomectomy. The large submucosal fibroid with central low attenuation at the lower margin of the myoma ectomy defect measuring approximately 3.5 x 3.6 cm. The smaller intracavitary fibroid measuring 3.3 x 2.5 cm. 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 Pelvic ascites around the uterus. Cystic changes in the left adnexa not well assessed in the current context. Other: In addition to the right lower quadrant abdominal wall hernia there is an umbilical hernia that contains fat and a small amount of fluid. This hernia has enlarged slightly and is moderate size with a defect approximately 3.8 cm in axial dimension and craniocaudal span of approximately 4.4 cm. Nodularity and or loculated fluid anterior to small bowel loops serves as an example of some of the peritoneal/mesenteric abnormality seen on today's scan (image 48, series 2) 1.9 by 1.4 cm. Musculoskeletal: No acute bone finding or destructive bone process. IMPRESSION: 1. Interval development of a right lower quadrant hernia perhaps the site of previous laparoscopic access, containing bowel and without signs of obstruction. 2.  Diffuse mesenteric thickening indicative of peritoneal disease in this patient with reported history of endometriosis. 3. Enlarging mass in the right upper quadrant potentially peritoneal implant from previous leiomyoma morcellation. Considerable enlargement since 2016. Correlate with any history of biopsy or surgery in this area since that time for any pathology results which may exist. Finding  is not typical or suggestive of endometrioma. Malignant transformation of leiomyoma and or deposit in the peritoneum is also considered in addition to other primary neoplasms of the abdominal mesentery. Blood supply is derived from upper abdominal collaterals primarily. Findings related to prior myomectomy with presumed degeneration of fibroids in the uterus as described. No frankly malignant features are noted within the uterus though comparison with previous, interval imaging from [facility] and [facility] may be helpful. 4. Cystic changes in the left adnexa, nonspecific. Consider correlation with ultrasound for pelvic findings or prior imaging as possible. 5. Pelvic ascites and mesenteric and peritoneal thickening. 6. Question mild hepatic steatosis. These results were called by telephone at the time of interpretation on 06/12/2019 at 3:49 pm to provider [facility] , who verbally acknowledged these results. Electronically Signed   By: Zetta Bills M.D.   On: 06/12/2019 15:51    Labs:  CBC: Recent Labs    08/19/18 1135 08/24/18 2151 08/25/18 0730  WBC 4.8 7.6 5.2  HGB 11.8* 10.5* 10.0*  HCT 36.5 31.2* 30.0*  PLT 227 246 232    COAGS: No results for input(s): INR, APTT in the last 8760 hours.  BMP: Recent Labs    08/19/18 1135 06/12/19 1009  NA 134*  --   K 3.8  --   CL 104  --   CO2 22  --   GLUCOSE 93  --   BUN 13  --   CALCIUM 8.9  --   CREATININE 0.95 0.90  GFRNONAA >60  --   GFRAA >60  --     LIVER FUNCTION TESTS: Recent Labs    08/19/18 1135  BILITOT 0.3  AST 11*  ALT 9  ALKPHOS 45    PROT 7.5  ALBUMIN 3.5    TUMOR MARKERS: No results for input(s): AFPTM, CEA, CA199, CHROMGRNA in the last 8760 hours.  Assessment and Plan: Intra-abdominal mass For CT guided biopsy Labs reviewed. Risks and benefits of abd mass biopsy was discussed with the patient and/or patient's family including, but not limited to bleeding, infection, damage to adjacent structures or low yield requiring additional tests.  All of the questions were answered and there is agreement to proceed.  Consent signed and in chart.    Thank you for this interesting consult.  I greatly enjoyed meeting Merced Trenchard and look forward to participating in their care.  A copy of this report was sent to the requesting provider on this date.  Electronically Signed: Ascencion Dike, PA-C 06/23/2019, 8:07 AM   I spent a total of 20 minutes in face to face in clinical consultation, greater than 50% of which was counseling/coordinating care for abd mass biopsy

## 2019-06-23 NOTE — Procedures (Signed)
Interventional Radiology Procedure Note  Procedure: CT BX RT ABD INTRAPERITONEAL MASS  Complications: None  Estimated Blood Loss: MIN  Findings: 18 G CORES

## 2019-06-26 LAB — SURGICAL PATHOLOGY

## 2019-06-26 NOTE — Progress Notes (Signed)
Thank you for the update.  My OR day is typically Monday but I can do some Thursdays as well. I would love to have all of this completed robotically, but I must tell you I have my doubts considering the significant growth of this fibroid within the past year.  I removed several large fibroids last year with the uterus up to her umbilicus with decrease in uterine size shortly after. We can try robotically but I would have a low threshold for conversion due to how large this has become and it's location.   Thanks,  [employer]

## 2019-06-28 ENCOUNTER — Telehealth: Payer: Self-pay

## 2019-06-28 ENCOUNTER — Other Ambulatory Visit: Payer: Self-pay

## 2019-06-28 ENCOUNTER — Ambulatory Visit (INDEPENDENT_AMBULATORY_CARE_PROVIDER_SITE_OTHER): Payer: BC Managed Care – PPO | Admitting: Surgery

## 2019-06-28 ENCOUNTER — Encounter: Payer: Self-pay | Admitting: Surgery

## 2019-06-28 VITALS — BP 143/93 | HR 81 | Temp 92.8°F | Ht 62.0 in | Wt 213.6 lb

## 2019-06-28 DIAGNOSIS — K439 Ventral hernia without obstruction or gangrene: Secondary | ICD-10-CM | POA: Diagnosis not present

## 2019-06-28 DIAGNOSIS — R1900 Intra-abdominal and pelvic swelling, mass and lump, unspecified site: Secondary | ICD-10-CM | POA: Diagnosis not present

## 2019-06-28 NOTE — Progress Notes (Signed)
06/28/2019  History of Present Illness: Lynn Boyd is a 34 y.o. female presenting for follow up of incisional hernias as well as fibroids.  She had a CT scan on 4/5 to evaluate the abdominal anatomy given her hernia and fibroid.  CT showed an umbilical and a RLQ port site hernia, as well as an enlarging RUQ mass, suspicious for fibroid.  A biopsy was done on 4/16 and patient presents to discuss results.  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    The patient reports she's doing well, without any worsening abdominal pain.  She's having some cramping at the time due to menstrual cycle.  Past Medical History: Past Medical History:  Diagnosis Date  . Cardiomyopathy ([place])    HYPERTROPHIC  . Chronic blood loss anemia    iron infusions  . Endometriosis   . Enlarged heart   . Fibroid uterus   . Heart murmur   . Herpes simplex virus (HSV) infection   . History of blood transfusion 2016   2 units, given for chronic blood loss anemia   . Hx MRSA infection    wound that required a drain  . Hypertension   . Sleep apnea    OSA, not currently using CPAP  . TOA (tubo-ovarian abscess) 11/28/2014  . Vaginal Pap smear, abnormal 2017   ASCUS with HPV+     Past Surgical History: Past Surgical History:  Procedure Laterality Date  . endometreoisis     surgery  . IR IMAGE GUIDED DRAINAGE BY PERCUTANEOUS CATHETER  2016   left tubo-ovarian abscess, performed at [place]  05/2010   [facility]  . ROBOT ASSISTED MYOMECTOMY N/A 08/24/2018   Procedure: ROBOTIC ASSISTED MYOMECTOMY;  Surgeon: Rubie Maid, MD;  Location: [facility] ORS;  Service: Gynecology;  Laterality: N/A;  . ROBOTIC ASSISTED LAPAROSCOPIC LYSIS OF ADHESION N/A 08/24/2018   Procedure: ROBOTIC ASSISTED LAPAROSCOPIC LYSIS OF ADHESION;  Surgeon: Rubie Maid, MD;  Location: [facility] ORS;  Service: Gynecology;  Laterality: N/A;    Home Medications: Prior to Admission medications    Medication Sig Start Date End Date Taking? Authorizing Provider  acetaminophen (TYLENOL) 500 MG tablet Take 2 tablets (1,000 mg total) by mouth every 8 (eight) hours as needed for mild pain. 08/26/18  Yes Rubie Maid, MD  azelastine (ASTELIN) 0.1 % nasal spray Place 1 spray into both nostrils daily as needed for rhinitis. Use in each nostril as directed   Yes [provider]  cholecalciferol (VITAMIN D3) 25 MCG (1000 UT) tablet Take 1,000 Units by mouth daily.    Yes [provider]  clindamycin (CLINDAGEL) 1 % gel APPLY TO THE AFFECTED AREA(S) EXTERNALLY TWICE DAILY 06/17/19  Yes [provider]  Clindamycin-Benzoyl Per, Refr, gel Apply 1 application topically daily. 04/24/19  Yes [provider]  clindamycin-benzoyl peroxide (BENZACLIN) gel Apply 1 application topically daily.  08/15/17  Yes [provider]  docusate sodium (COLACE) 100 MG capsule Take 1 capsule (100 mg total) by mouth 2 (two) times daily as needed. 08/26/18  Yes Rubie Maid, MD  Elagolix Sodium (ORILISSA) 150 MG TABS Take 150 mg by mouth daily. 08/31/18  Yes Rubie Maid, MD  Elagolix-Estrad-[employer]ore[employer] & Elago [facility]) 300-1-0.5 & 300 MG CPPK Take 1 tablet by mouth in the morning and at bedtime. 06/01/19  Yes Rubie Maid, MD  ferrous sulfate 325 (65 FE) MG EC tablet Take 325 mg by mouth daily with breakfast.    Yes [provider]  metoprolol succinate (TOPROL-XL) 50 MG 24 hr tablet Take 50 mg by mouth daily. 05/11/19  Yes [provider]  metoprolol tartrate (LOPRESSOR) 50 MG tablet Take 50 mg by mouth daily.   Yes [provider]  naproxen sodium (ANAPROX) 220 MG tablet Take 440 mg by mouth every 8 (eight) hours as needed (pain).    Yes [provider]  Scar Treatment Products [facility]) GEL Apply 1 application topically.   Yes [provider]  simethicone (MYLICON) 80 MG chewable tablet Chew 1 tablet (80 mg total) by mouth 4 (four) times daily  as needed for flatulence. 08/26/18  Yes Rubie Maid, MD  spironolactone (ALDACTONE) 25 MG tablet Take 50 mg by mouth daily.    Yes [provider]  tretinoin (RETIN-A) 0.025 % cream Apply 1 application topically at bedtime.    Yes [provider]  tretinoin (RETIN-A) 0.1 % cream APPLY TO THE AFFECTED AREA(S) EXTERNALLY nightly AS DIRECTED 06/17/19  Yes [provider]  valACYclovir (VALTREX) 500 MG tablet Take 500 mg by mouth 2 (two) times daily as needed (flare-ups).   Yes [provider]    Allergies: Allergies  Allergen Reactions  . Bactrim [Sulfamethoxazole-Trimethoprim] Swelling  . Latex Rash    Review of Systems: Review of Systems  Constitutional: Negative for chills and fever.  Respiratory: Negative for shortness of breath.   Cardiovascular: Negative for chest pain.  Gastrointestinal: Negative for abdominal pain, nausea and vomiting.    Physical Exam BP (!) 143/93   Pulse 81   Temp (!) 92.8 F (33.8 C) (Temporal)   Ht 5\' 2"  (1.575 m)   Wt 213 lb 9.6 oz (96.9 kg)   SpO2 97%   BMI 39.07 kg/m  CONSTITUTIONAL: No acute distress HEENT:  Normocephalic, atraumatic, extraocular motion intact. RESPIRATORY:  Lungs are clear, and breath sounds are equal bilaterally. Normal respiratory effort without pathologic use of accessory muscles. CARDIOVASCULAR: Heart is regular without murmurs, gallops, or rubs. GI: The abdomen is soft, non-distended, non-tender.  Patient has umbilical and RLQ incisional hernias, reducible.  No peritonitis.  NEUROLOGIC:  Motor and sensation is grossly normal.  Cranial nerves are grossly intact. PSYCH:  Alert and oriented to person, place and time. Affect is normal.  Labs/Imaging: Pathology 06/23/19: DIAGNOSIS:  A. INTRAPERITONEAL MASS, RIGHT ABDOMEN; CT-GUIDED NEEDLE CORE BIOPSY:  - SMOOTH MUSCLE NEOPLASM; SEE COMMENT.  Comment:  There is no evidence of necrosis, nuclear atypia, or mitotic activity in this sample to  suggest a malignant process. In the proper clinical context, the findings could be compatible with leiomyoma. However, given the reported large size of the mass, the biopsy may not be  representative of the entire lesion. A more clinically significant lesion cannot be excluded based on this sample.  Assessment and Plan: This is a 34 y.o. female with incisional hernias and a large RUQ mass, possibly fibroid.  --Discussed with the patient that ideally, without the mass, we would approach her hernias robotically to be repaired with mesh.  However, the presence of the fibroid presents an additional complexity.  The mass is large and has a lot of vasculature.  Given that it is enlarging, I think it's still best to resect the mass.  Discussed with Dr. Marcelline Mates and she agrees.  There is a potential for still approaching this robotically, but we would have a low threshold to convert to open given the size of the mass, and likely adhesions and complexity.  If that's the case, then the patient would  have a midline incision.  The patient is concerned about the scar, as she's worried about the size and potential for keloid.  At the end, I informed the patient that we would start robotically, and convert to open if needed.  The goal is to be safe and do what's best for the patient.  This would be a combined surgery with Dr. Marcelline Mates as well as Dr. Genevive Bi to assist in the hernia repair. --Discussed the risks of bleeding, infection, and injury to surrounding structures.  Discussed need for COVID testing prior to surgery.  Patient reports that she would like to wait for surgery until after June 7th.  That actually will allow [place] to be able to coordinate better our schedules.  I will be on vacation between June 7th and June 18th, but we can hopefully schedule her for the following week. --In the meantime, we will set up cardiology and medical clearance, and she will try to lose weight.  Face-to-face time spent with the patient  and care providers was 25 minutes, with more than 50% of the time spent counseling, educating, and coordinating care of the patient.     Melvyn Neth, [facility]

## 2019-06-28 NOTE — Telephone Encounter (Signed)
Cardiac Clearance re-faxed to [place] office [phone].

## 2019-06-28 NOTE — Patient Instructions (Addendum)
One of our surgery schedulers Pamala Hurry or Marzetta Board will contact you within the next 24-48 hours. During that call, they will discuss the preparation prior to surgery and they will also discuss the different dates and times to get you scheduled. Please have the BLUE sheet available when one of the schedulers contact you. If you have any questions or concerns, please give our office a call.   Dr.Piscoya will send Medical and Cardiac Clearance at today's visit.   Open Hernia Repair, Adult  Open hernia repair is a surgical procedure to fix a hernia. A hernia occurs when an internal organ or tissue pushes out through a weak spot in the abdominal wall muscles. Hernias commonly occur in the groin and around the navel. Most hernias tend to get worse over time. Often, surgery is done to prevent the hernia from becoming bigger, uncomfortable, or an emergency. Emergency surgery may be needed if abdominal contents get stuck in the opening (incarcerated hernia) or the blood supply gets cut off (strangulated hernia). In an open repair, an incision is made in the abdomen to perform the surgery. Tell a health care provider about:  Any allergies you have.  All medicines you are taking, including vitamins, herbs, eye drops, creams, and over-the-counter medicines.  Any problems you or family members have had with anesthetic medicines.  Any blood or bone disorders you have.  Any surgeries you have had.  Any medical conditions you have, including any recent cold or flu symptoms.  Whether you are pregnant or may be pregnant. What are the risks? Generally, this is a safe procedure. However, problems may occur, including:  Long-lasting (chronic) pain.  Bleeding.  Infection.  Damage to the testicle. This can cause shrinking or swelling.  Damage to the bladder, blood vessels, intestine, or nerves near the hernia.  Trouble passing urine.  Allergic reactions to medicines.  Return of the hernia. What happens  before the procedure? Staying hydrated Follow instructions from your health care provider about hydration, which may include:  Up to 2 hours before the procedure - you may continue to drink clear liquids, such as water, clear fruit juice, black coffee, and plain tea. Eating and drinking restrictions Follow instructions from your health care provider about eating and drinking, which may include:  8 hours before the procedure - stop eating heavy meals or foods such as meat, fried foods, or fatty foods.  6 hours before the procedure - stop eating light meals or foods, such as toast or cereal.  6 hours before the procedure - stop drinking milk or drinks that contain milk.  2 hours before the procedure - stop drinking clear liquids. Medicines  Ask your health care provider about: ? Changing or stopping your regular medicines. This is especially important if you are taking diabetes medicines or blood thinners. ? Taking medicines such as aspirin and ibuprofen. These medicines can thin your blood. Do not take these medicines before your procedure if your health care provider instructs you not to.  You may be given antibiotic medicine to help prevent infection. General instructions  You may have blood tests or imaging studies.  Ask your health care provider how your surgical site will be marked or identified.  If you smoke, do not smoke for at least 2 weeks before your procedure or for as long as told by your health care provider.  Let your health care provider know if you develop a cold or any infection before your surgery.  Plan to have someone take  you home from the hospital or clinic.  If you will be going home right after the procedure, plan to have someone with you for 24 hours. What happens during the procedure?  To reduce your risk of infection: ? Your health care team will wash or sanitize their hands. ? Your skin will be washed with soap. ? Hair may be removed from the  surgical area.  An IV tube will be inserted into one of your veins.  You will be given one or more of the following: ? A medicine to help you relax (sedative). ? A medicine to numb the area (local anesthetic). ? A medicine to make you fall asleep (general anesthetic).  Your surgeon will make an incision over the hernia.  The tissues of the hernia will be moved back into place.  The edges of the hernia may be stitched together.  The opening in the abdominal muscles will be closed with stitches (sutures). Or, your surgeon will place a mesh patch made of manmade (synthetic) material over the opening.  The incision will be closed.  A bandage (dressing) may be placed over the incision. The procedure may vary among health care providers and hospitals. What happens after the procedure?  Your blood pressure, heart rate, breathing rate, and blood oxygen level will be monitored until the medicines you were given have worn off.  You may be given medicine for pain.  Do not drive for 24 hours if you received a sedative. This information is not intended to replace advice given to you by your health care provider. Make sure you discuss any questions you have with your health care provider. Document Revised: 02/05/2017 Document Reviewed: 08/07/2015 Elsevier Patient Education  2020 [employer].

## 2019-06-28 NOTE — Telephone Encounter (Signed)
Medical Clearance was faxed to [place] office at [phone].  Cardiac Clearance was faxed to [place] office at [phone].

## 2019-07-01 NOTE — Progress Notes (Signed)
Ok thanks 

## 2019-07-05 ENCOUNTER — Telehealth: Payer: Self-pay | Admitting: Surgery

## 2019-07-05 NOTE — Telephone Encounter (Signed)
Pt has been advised of Pre-Admission date/time, COVID Testing date and Surgery date.  Surgery Date: 09/07/19 Preadmission Testing Date: 08/25/19 (phone 1p-5p) Covid Testing Date: 09/05/19 - patient advised to go to the [facility] between 8a-1p   Patient has been made aware to call [phone], between 1-3:00pm the day before surgery, to find out what time to arrive for surgery.

## 2019-07-11 NOTE — Progress Notes (Signed)
Medical Clearance received from Dr Cassandria Anger office. The patient is medically cleared at [place] for surgery.

## 2019-07-18 ENCOUNTER — Telehealth: Payer: Self-pay

## 2019-07-18 NOTE — Telephone Encounter (Signed)
Received Cardiac Clearance from [place] office. Patient is optimized for surgery at [facility].

## 2019-08-02 ENCOUNTER — Other Ambulatory Visit: Payer: Self-pay

## 2019-08-02 ENCOUNTER — Other Ambulatory Visit: Payer: Self-pay | Admitting: Obstetrics and Gynecology

## 2019-08-02 ENCOUNTER — Ambulatory Visit: Payer: BC Managed Care – PPO | Admitting: Obstetrics and Gynecology

## 2019-08-02 ENCOUNTER — Ambulatory Visit (INDEPENDENT_AMBULATORY_CARE_PROVIDER_SITE_OTHER): Payer: BC Managed Care – PPO

## 2019-08-02 ENCOUNTER — Encounter: Payer: Self-pay | Admitting: Obstetrics and Gynecology

## 2019-08-02 VITALS — BP 154/104 | HR 73 | Ht 62.0 in | Wt 213.8 lb

## 2019-08-02 DIAGNOSIS — N939 Abnormal uterine and vaginal bleeding, unspecified: Secondary | ICD-10-CM

## 2019-08-02 DIAGNOSIS — D251 Intramural leiomyoma of uterus: Secondary | ICD-10-CM | POA: Diagnosis not present

## 2019-08-02 DIAGNOSIS — I10 Essential (primary) hypertension: Secondary | ICD-10-CM

## 2019-08-02 DIAGNOSIS — N809 Endometriosis, unspecified: Secondary | ICD-10-CM

## 2019-08-02 DIAGNOSIS — K439 Ventral hernia without obstruction or gangrene: Secondary | ICD-10-CM

## 2019-08-02 DIAGNOSIS — N938 Other specified abnormal uterine and vaginal bleeding: Secondary | ICD-10-CM | POA: Diagnosis not present

## 2019-08-02 DIAGNOSIS — D250 Submucous leiomyoma of uterus: Secondary | ICD-10-CM

## 2019-08-02 MED ORDER — ORILISSA 200 MG PO TABS: 1.0000 | ORAL_TABLET | Freq: Two times a day (BID) | ORAL | 5 refills | Status: DC

## 2019-08-02 NOTE — Progress Notes (Signed)
Pt present for follow up for test results for ultrasound for fibroids. Pt stated that she is still having a lot of issues with her cycle.

## 2019-08-02 NOTE — Progress Notes (Signed)
GYNECOLOGY PROGRESS NOTE  Subjective:    Patient ID: Lynn Boyd, female    DOB: 03/04/1986, 34 y.o.   MRN: [id number]  HPI  Patient is a 34 y.o. G0P0000 female who presents for follow up of ultrasound results and discuss upcoming surgery.  Patient plans to undergo surgery for hernia repair, however was noted to have a possible fibroid in RUQ, based on recent biopsy results of RUQ mass.   Of note, patient reports that she is having more breakthrough bleeding with use or [place].   Would like to go back to using [place].  She wonders if she could try the higher dose of Orilissa.    The following portions of the patient's history were reviewed and updated as appropriate: allergies, current medications, past family history, past medical history, past social history, past surgical history and problem list.  Review of Systems Pertinent items noted in HPI and remainder of comprehensive ROS otherwise negative.   Objective:   Blood pressure (!) 154/104, pulse 73, height 5\' 2"  (1.575 m), weight 213 lb 12.8 oz (97 kg), last menstrual period 06/24/2019. General appearance: alert and no distress Pelvic exam deferred.     Labs:   Results for orders placed or performed during the hospital encounter of 06/23/19  CBC upon arrival  Result Value Ref Range   WBC 3.8 (L) 4.0 - 10.5 K/uL   RBC 4.76 3.87 - 5.11 MIL/uL   Hemoglobin 13.3 12.0 - 15.0 g/dL   HCT 39.8 36.0 - 46.0 %   MCV 83.6 80.0 - 100.0 fL   MCH 27.9 26.0 - 34.0 pg   MCHC 33.4 30.0 - 36.0 g/dL   RDW 15.2 11.5 - 15.5 %   Platelets 191 150 - 400 K/uL   nRBC 0.0 0.0 - 0.2 %  Protime-INR upon arrival  Result Value Ref Range   Prothrombin Time 13.5 11.4 - 15.2 seconds   INR 1.0 0.8 - 1.2  Pregnancy, urine  Result Value Ref Range   Preg Test, Ur NEGATIVE NEGATIVE  Surgical pathology  Result Value Ref Range   SURGICAL PATHOLOGY      SURGICAL PATHOLOGY CASE: [id number] PATIENT: Lynn Boyd Surgical Pathology  Report     Specimen Submitted: A. Intraperitoneal mass, right abd  Clinical History: Unknown mass      DIAGNOSIS: A. INTRAPERITONEAL MASS, RIGHT ABDOMEN; CT-GUIDED NEEDLE CORE BIOPSY: - SMOOTH MUSCLE NEOPLASM; SEE COMMENT.  Comment: There is no evidence of necrosis, nuclear atypia, or mitotic activity in this sample to suggest a malignant process.  In the proper clinical context, the findings could be compatible with leiomyoma.  However, given the reported large size of the mass, the biopsy may not be representative of the entire lesion.  A more clinically significant lesion cannot be excluded based on this sample.   GROSS DESCRIPTION: A. Labeled: Intraperitoneal mass Received: In formalin Tissue fragment(s): 3 Size: From 0.7 x 0.1 cm up to 2.5 x 0.1 cm Description: Pink-white cylindrically shaped tissue fragments Entirely submitted in 2 cassettes.   Final Diagnosis p erformed by Betsy Pries, MD.   Electronically signed 06/26/2019 1:44:02PM The electronic signature indicates that the named Attending Pathologist has evaluated the specimen Technical component performed at [facility], [address], [place], [place] [address] Lab: [phone] Dir: Rush Farmer, MD, MMM  Professional component performed at [facility], [facility], [place], [place], [place] [address] Lab: [phone] Dir: Dellia Nims. Reuel Derby, MD      Imaging:   Patient Name: Lynn  Boyd DOB: 09-02-1985 MRN: [id number] ULTRASOUND REPORT   Location: [facility] Date of Service: 08/02/2019        Indications:Enlarged Uterus    Findings:  The uterus is anteverted and measures 13. X 7.6 x 8.4 cm. Echo texture is heterogenous with evidence of focal masses. fibroids measuring: Fibroid 1:SM 3.3 x 2.4 x 3.3 cm Fibroid 2:Anterior IM 4.8 x 3.3 x 3.8 cm Fibroid 3: Posterior IM 4.4 x 3.8x 3.8 cm   The Endometrium measures 4 mm.   Right Ovary measures 3.9 x 2.1 X  25.4 cm. It is normal in appearance. Left Ovary measures 34.3 x 2.8 x 2.7 cm. It is normal in appearance. Left hydrosalpinx.   Survey of the adnexa demonstrates no adnexal masses. There is no free fluid in the cul de sac.   Impression: 1. Lt hydrosalpinx 2. Enlarge fibroid uterus 3. Large SM fibroid 4. Free fluid mostly lt adnexal area.   Recommendations: 1.Clinical correlation with the patient's History and Physical Exam.     Jenine M. Alessi    RDMS     CT BIOPSY INDICATION: Enlarging indeterminate right intra-abdominal mass  EXAM: CT-GUIDED BIOPSY RIGHT INTRA-ABDOMINAL MASS  MEDICATIONS: 1% LIDOCAINE LOCAL  ANESTHESIA/SEDATION: 2.0 mg IV Versed; 100 mcg IV Fentanyl  Moderate Sedation Time:  14 MINUTES  The patient was continuously monitored during the procedure by the interventional radiology nurse under my direct supervision.  PROCEDURE: The procedure, risks, benefits, and alternatives were explained to the patient. Questions regarding the procedure were encouraged and answered. The patient understands and consents to the procedure.  Previous imaging reviewed. Patient positioned supine. Noncontrast localization CT performed. The right subhepatic intraperitoneal mass was localized and marked for an anterolateral approach.  Under sterile conditions and local anesthesia, a 17 gauge 6.8 cm access needle was advanced to the lesion. Needle position confirmed with CT. 18 gauge core biopsies obtained. Samples placed in formalin. Needle tract occluded with Gel-Foam. Postprocedure imaging demonstrates no hemorrhage or hematoma.  Patient tolerated the procedure well without complication. Vital sign monitoring by nursing staff during the procedure will continue as patient is in the special procedures unit for post procedure observation.  FINDINGS: The images document guide needle placement within the right subhepatic intraperitoneal mass. Post biopsy images  demonstrate no hemorrhage or hematoma.  COMPLICATIONS: None immediate.  IMPRESSION: Successful CT-guided core biopsy of the right subhepatic intraperitoneal abdominal mass  Electronically Signed   By: Jerilynn Mages.  Shick M.D.   On: 06/23/2019 09:56      CLINICAL DATA:  Abdominal pain, hernia suspected.   EXAM: CT ABDOMEN AND PELVIS WITH CONTRAST   TECHNIQUE: Multidetector CT imaging of the abdomen and pelvis was performed using the standard protocol following bolus administration of intravenous contrast.   CONTRAST:  161mL OMNIPAQUE IOHEXOL 300 MG/ML  SOLN   COMPARISON:  CT 11/28/2014   FINDINGS: Lower chest: Incidental imaging of the lung bases is unremarkable.   Hepatobiliary: No focal, intrahepatic lesion. Lobular hepatic contours with suggestion of background steatosis.   Portal vein is patent.   Gallbladder is unremarkable and there are no signs of biliary ductal dilation. See below for perihepatic/subhepatic mass.   Pancreas: Pancreas is normal without focal lesion or ductal dilation.   Spleen: Spleen is normal size without focal lesion.   Adrenals/Urinary Tract: Adrenal glands are normal.   Smooth renal contours without evidence of hydronephrosis.   Stomach/Bowel: Bowel loops displaced by a large mass in the right hemiabdomen measuring 12.5 x 8.7 x 9.9  cm previously approximately 5.6 x 4.7 cm in greatest axial dimension. Extensive collateral pathways in the upper abdomen appear to supply this mass at numerous locations including upper abdominal contributions.   Bowel contained in a right rectus hernia/spigelian hernia. Aperture of the hernia is approximately 2 cm. Small amount of fluid adjacent to bowel loops. No sign of obstruction. The appendix is normal.   Vascular/Lymphatic: Patent abdominal vasculature. No adenopathy.   No pelvic lymphadenopathy.   Reproductive: Defect in the uterine fundus from previous myomectomy. The large submucosal fibroid  with central low attenuation at the lower margin of the myoma ectomy defect measuring approximately 3.5 x 3.6 cm. The smaller intracavitary fibroid measuring 3.3 x 2.5 cm.   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 Pelvic ascites around the uterus. Cystic changes in the left adnexa not well assessed in the current context.   Other: In addition to the right lower quadrant abdominal wall hernia there is an umbilical hernia that contains fat and a small amount of fluid. This hernia has enlarged slightly and is moderate size with a defect approximately 3.8 cm in axial dimension and craniocaudal span of approximately 4.4 cm.   Nodularity and or loculated fluid anterior to small bowel loops serves as an example of some of the peritoneal/mesenteric abnormality seen on today's scan (image 48, series 2) 1.9 by 1.4 cm.   Musculoskeletal: No acute bone finding or destructive bone process.   IMPRESSION: 1. Interval development of a right lower quadrant hernia perhaps the site of previous laparoscopic access, containing bowel and without signs of obstruction. 2. Diffuse mesenteric thickening indicative of peritoneal disease in this patient with reported history of endometriosis. 3. Enlarging mass in the right upper quadrant potentially peritoneal implant from previous leiomyoma morcellation. Considerable enlargement since 2016. Correlate with any history of biopsy or surgery in this area since that time for any pathology results which may exist. Finding is not typical or suggestive of endometrioma. Malignant transformation of leiomyoma and or deposit in the peritoneum is also considered in addition to other primary neoplasms of the abdominal mesentery. Blood supply is derived from upper abdominal collaterals primarily. Findings related to prior myomectomy with presumed degeneration of fibroids  in the uterus as described. No frankly malignant features are noted within the uterus though comparison with previous, interval imaging from [facility] and [facility] may be helpful. 4. Cystic changes in the left adnexa, nonspecific. Consider correlation with ultrasound for pelvic findings or prior imaging as possible. 5. Pelvic ascites and mesenteric and peritoneal thickening. 6. Question mild hepatic steatosis.   These results were called by telephone at the time of interpretation on 06/12/2019 at 3:49 pm to provider [facility] , who verbally acknowledged these results.     Electronically Signed   By: Zetta Bills M.D.   On: 06/12/2019 15:51   Assessment:   1. Intramural and submucous leiomyoma of uterus   2. DUB (dysfunctional uterine bleeding)   3. Endometriosis   4. Essential hypertension   5. Ventral hernia without obstruction or gangrene     Plan:   1. Patient plans to undergo surgery for repair of ventral hernia on July 1.  However, mass noted in RUQ, with biopsy results consistent with a fibroid. Patient with prior h/o of several myomectomy surgeries, most recent was last year with dense adhesions. I have had discussion with patient regarding surgical intervention. I will be present during  surgical evaluation and perform myomectomy if indicated. Can also remove additional fibroids noted on recent ultrasound.   Also discussed possibility of hysterectomy due to excessive bleeding/extensive scar tissue. Discussed risks and benefits of procedure. Plans for robotic ventral hernia repair with possibly myomectomy.  2. Patient desires to change back from [place] to [place] for management of fibroids, endometriosis, and DUB.  Will prescribe. Patient desires to try higher dose (200 mg). Understands she will be on this regimen for 6 months.  3. Essential HTN - patient with significantly elevated BP today.  Asymptomatic. Patient notes compliance with elevated BPs.    Rubie Maid,  MD [facility] 08/05/2019 6:43 PM

## 2019-08-05 ENCOUNTER — Encounter: Payer: Self-pay | Admitting: Obstetrics and Gynecology

## 2019-08-25 ENCOUNTER — Other Ambulatory Visit: Payer: Self-pay

## 2019-08-25 ENCOUNTER — Encounter
Admission: RE | Admit: 2019-08-25 | Discharge: 2019-08-25 | Disposition: A | Discharge status: Home / Self Care | Payer: BC Managed Care – PPO | Source: Ambulatory Visit | Attending: Surgery | Admitting: Surgery

## 2019-08-25 HISTORY — DX: Depression, unspecified: F32.A

## 2019-08-25 HISTORY — DX: Anxiety disorder, unspecified: F41.9

## 2019-08-25 NOTE — Patient Instructions (Signed)
Your procedure is scheduled on: Thursday September 07, 2019 Report to Day Surgery. To find out your arrival time please call [phone] between 1PM - 3PM on Wednesday September 06, 2019.  Remember: Instructions that are not followed completely may result in serious medical risk,  up to and including death, or upon the discretion of your surgeon and anesthesiologist your  surgery may need to be rescheduled.     _X__ 1. Do not eat food after midnight the night before your procedure.                 No gum chewing or hard candies. You may drink clear liquids up to 2 hours                 before you are scheduled to arrive for your surgery- DO not drink clear                 liquids within 2 hours of the start of your surgery.                 Clear Liquids include:  water, apple juice without pulp, clear Gatorade, G2 or                  Gatorade Zero (avoid Red/Purple/Blue), Black Coffee or Tea (Do not add                 anything to coffee or tea).  __X__2.  On the morning of surgery brush your teeth with toothpaste and water, you                may rinse your mouth with mouthwash if you wish.  Do not swallow any toothpaste of mouthwash.     _X__ 3.  No Alcohol for 24 hours before or after surgery.   _X__ 4.  Do Not Smoke or use e-cigarettes For 24 Hours Prior to Your Surgery.                 Do not use any chewable tobacco products for at least 6 hours prior to                 Surgery.  _X__  5.  Do not use any recreational drugs (marijuana, cocaine, heroin, ecstacy, MDMA or other)                For at least one week prior to your surgery.  Combination of these drugs with anesthesia                May have life threatening results.  __X__6.  Notify your doctor if there is any change in your medical condition      (cold, fever, infections).     Do not wear jewelry, make-up, hairpins, clips or nail polish. Do not wear lotions, powders, or perfumes. You may wear  deodorant. Do not shave 48 hours prior to surgery. Men may shave face and neck. Do not bring valuables to the hospital.    [facility] is not responsible for any belongings or valuables.  Contacts, dentures or bridgework may not be worn into surgery. Leave your suitcase in the car. After surgery it may be brought to your room. For patients admitted to the hospital, discharge time is determined by your treatment team.   Patients discharged the day of surgery will not be allowed to drive home.   Make arrangements for someone to be with you for the first 24  hours of your Same Day Discharge.  __X__ Take these medicines the morning of surgery with A SIP OF WATER:    1. metoprolol succinate (TOPROL-XL) 50 MG  __X__ Use CHG Soap as directed  __X__ Stop Anti-inflammatories such as naproxen sodium (ALEVE), ibuprofen, Advil, aspirin and or BC powders.    __X__ Stop supplements until after surgery.    __X__ Do not start any herbal supplements before your surgery.

## 2019-08-28 ENCOUNTER — Ambulatory Visit: Payer: BC Managed Care – PPO | Admitting: Surgery

## 2019-08-28 ENCOUNTER — Encounter: Payer: Self-pay | Admitting: Surgery

## 2019-08-28 ENCOUNTER — Other Ambulatory Visit: Payer: Self-pay

## 2019-08-28 VITALS — BP 150/107 | HR 81 | Temp 99.0°F | Resp 12 | Ht 62.0 in | Wt 209.6 lb

## 2019-08-28 DIAGNOSIS — R1900 Intra-abdominal and pelvic swelling, mass and lump, unspecified site: Secondary | ICD-10-CM | POA: Diagnosis not present

## 2019-08-28 DIAGNOSIS — K439 Ventral hernia without obstruction or gangrene: Secondary | ICD-10-CM

## 2019-08-28 NOTE — H&P (View-Only) (Signed)
08/28/2019  History of Present Illness: Lynn Boyd is a 34 y.o. female presenting for follow-up and H&P update for her upcoming surgery on 09/07/2019.  She has a history of an umbilical and right lower quadrant incisional hernias from prior robotic myomectomy and other surgeries.  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  I last saw her on 06/28/2019 to discuss surgery planning.  She preferred to have the surgery after June 7 and given that I was unavailable over the last 2 weeks, we decided to schedule the case for 09/07/2019.  We received clearance forms from cardiology as well as her PCP in the meantime.  Today, the patient reports that she has been doing well from the hernia standpoint without any significant pain.  She does report that she is very anxious and nervous about the upcoming surgery.  She does mention that a friend of hers had surgery a few years ago for a brain tumor and she had a postoperative stroke and passed away.  However she is also had other friends that have had hernia repairs who did well.  She also says that she is just nervous.  Past Medical History: Past Medical History:  Diagnosis Date  . Anxiety   . Cardiomyopathy ([place])    HYPERTROPHIC  . Chronic blood loss anemia    iron infusions  . Depression   . Endometriosis   . Enlarged heart   . Fibroid uterus   . Heart murmur   . Herpes simplex virus (HSV) infection   . History of blood transfusion 2016   2 units, given for chronic blood loss anemia   . Hx MRSA infection    wound that required a drain  . Hypertension   . Sleep apnea    OSA, not currently using CPAP  . TOA (tubo-ovarian abscess) 11/28/2014  . Vaginal Pap smear, abnormal 2017   ASCUS with HPV+     Past Surgical History: Past Surgical History:  Procedure Laterality Date  . endometreoisis     surgery  . IR IMAGE GUIDED  DRAINAGE BY PERCUTANEOUS CATHETER  2016   left tubo-ovarian abscess, performed at [place]  05/2010   [facility]  . ROBOT ASSISTED MYOMECTOMY N/A 08/24/2018   Procedure: ROBOTIC ASSISTED MYOMECTOMY;  Surgeon: Rubie Maid, MD;  Location: [facility] ORS;  Service: Gynecology;  Laterality: N/A;  . ROBOTIC ASSISTED LAPAROSCOPIC LYSIS OF ADHESION N/A 08/24/2018   Procedure: ROBOTIC ASSISTED LAPAROSCOPIC LYSIS OF ADHESION;  Surgeon: Rubie Maid, MD;  Location: [facility] ORS;  Service: Gynecology;  Laterality: N/A;    Home Medications: Prior to Admission medications   Medication Sig Start Date End Date Taking? Authorizing Provider  acetaminophen (TYLENOL) 325 MG tablet Take 650 mg by mouth every 6 (six) hours as needed.   Yes [provider]  adapalene (DIFFERIN) 0.1 % gel Apply 1 application topically every other day.   Yes [provider]  cholecalciferol (VITAMIN D3) 25 MCG (1000 UT) tablet Take 1,000 Units by mouth daily.    Yes [provider]  clindamycin (CLINDAGEL) 1 % gel Apply 1 application topically daily.  06/17/19  Yes [provider]  Elagolix Sodium (ORILISSA) 200 MG TABS Take 1 tablet by mouth in the morning and at bedtime. 08/02/19  Yes Rubie Maid, MD  metoprolol succinate (TOPROL-XL) 50 MG 24 hr tablet Take 50 mg by mouth daily.  05/11/19  Yes [provider]  naproxen sodium (ALEVE) 220 MG tablet Take 220 mg by mouth.   Yes [provider]  spironolactone (ALDACTONE) 50 MG tablet Take 100 mg by mouth daily.    Yes [provider]  tretinoin (RETIN-A) 0.025 % cream Apply 1 application topically at bedtime.    Yes [provider]    Allergies: Allergies  Allergen Reactions  . Bactrim [Sulfamethoxazole-Trimethoprim] Swelling  . Latex Rash    Review of Systems: Review of Systems  Constitutional: Negative for chills and fever.  Respiratory: Negative for shortness of breath.    Cardiovascular: Negative for chest pain.  Gastrointestinal: Negative for abdominal pain, nausea and vomiting.  Psychiatric/Behavioral: The patient is nervous/anxious.     Physical Exam BP (!) 150/107   Pulse 81   Temp 99 F (37.2 C)   Resp 12   Ht 5\' 2"  (1.575 m)   Wt 209 lb 9.6 oz (95.1 kg)   SpO2 99%   BMI 38.34 kg/m  CONSTITUTIONAL: No acute distress HEENT:  Normocephalic, atraumatic, extraocular motion intact. RESPIRATORY:  Lungs are clear, and breath sounds are equal bilaterally. Normal respiratory effort without pathologic use of accessory muscles. CARDIOVASCULAR: Heart is regular without murmurs, gallops, or rubs. GI: The abdomen is soft, nondistended, nontender to palpation at this point.  Patient does have a reducible umbilical and right lower quadrant incisional hernias with a palpable mass in the right upper quadrant.  Patient has keloid scars over her prior incisions. NEUROLOGIC:  Motor and sensation is grossly normal.  Cranial nerves are grossly intact. PSYCH:  Alert and oriented to person, place and time. Affect is normal.   Assessment and Plan: This is a 34 y.o. female with 2 incisional hernias as well as an enlarging right upper quadrant mass suspicious for fibroid.  -The patient is currently scheduled for July 1 for a robotic assisted incisional hernia repair as well as excision of right upper quadrant mass with me and Dr. Marcelline Mates.  Discussed with her again the plan for surgery and reviewed the different scenarios for her surgery.  Discussed with her that ideally we would be able to keep everything robotically but that there is likelihood that we may have to do a midline incision to help better expose and resect the fibroid mass.  Nonetheless, the plan will still be to fix both of her hernias as well as use mesh to reinforce the repair.  Discussed with her the possibility for overnight stay depending on how everything goes.  Reviewed with her again the risks of bleeding,  infection, injury to surrounding structures and discussed with her her concerns and reassured her.  She understands the need still for Covid testing and she is already scheduled for this.  All of her questions were answered.  Face-to-face time spent with the patient and care providers was 25 minutes, with more than 50% of the time spent counseling, educating, and coordinating care of the patient.     Melvyn Neth, [facility]

## 2019-08-28 NOTE — Progress Notes (Signed)
08/28/2019  History of Present Illness: Lynn Boyd is a 34 y.o. female presenting for follow-up and H&P update for her upcoming surgery on 09/07/2019.  She has a history of an umbilical and right lower quadrant incisional hernias from prior robotic myomectomy and other surgeries.  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  I last saw her on 06/28/2019 to discuss surgery planning.  She preferred to have the surgery after June 7 and given that I was unavailable over the last 2 weeks, we decided to schedule the case for 09/07/2019.  We received clearance forms from cardiology as well as her PCP in the meantime.  Today, the patient reports that she has been doing well from the hernia standpoint without any significant pain.  She does report that she is very anxious and nervous about the upcoming surgery.  She does mention that a friend of hers had surgery a few years ago for a brain tumor and she had a postoperative stroke and passed away.  However she is also had other friends that have had hernia repairs who did well.  She also says that she is just nervous.  Past Medical History: Past Medical History:  Diagnosis Date   Anxiety    Cardiomyopathy ([place])    HYPERTROPHIC   Chronic blood loss anemia    iron infusions   Depression    Endometriosis    Enlarged heart    Fibroid uterus    Heart murmur    Herpes simplex virus (HSV) infection    History of blood transfusion 2016   2 units, given for chronic blood loss anemia    Hx MRSA infection    wound that required a drain   Hypertension    Sleep apnea    OSA, not currently using CPAP   TOA (tubo-ovarian abscess) 11/28/2014   Vaginal Pap smear, abnormal 2017   ASCUS with HPV+     Past Surgical History: Past Surgical History:  Procedure Laterality Date   endometreoisis     surgery   IR IMAGE GUIDED  DRAINAGE BY PERCUTANEOUS CATHETER  2016   left tubo-ovarian abscess, performed at [place]  05/2010   [facility]   [place] N/A 08/24/2018   Procedure: ROBOTIC [place];  Surgeon: Rubie Maid, MD;  Location: [facility] ORS;  Service: Gynecology;  Laterality: N/A;   ROBOTIC ASSISTED LAPAROSCOPIC LYSIS OF ADHESION N/A 08/24/2018   Procedure: ROBOTIC ASSISTED LAPAROSCOPIC LYSIS OF ADHESION;  Surgeon: Rubie Maid, MD;  Location: [facility] ORS;  Service: Gynecology;  Laterality: N/A;    Home Medications: Prior to Admission medications   Medication Sig Start Date End Date Taking? Authorizing Provider  acetaminophen (TYLENOL) 325 MG tablet Take 650 mg by mouth every 6 (six) hours as needed.   Yes [provider]  adapalene (DIFFERIN) 0.1 % gel Apply 1 application topically every other day.   Yes [provider]  cholecalciferol (VITAMIN D3) 25 MCG (1000 UT) tablet Take 1,000 Units by mouth daily.    Yes [provider]  clindamycin (CLINDAGEL) 1 % gel Apply 1 application topically daily.  06/17/19  Yes [provider]  Elagolix Sodium (ORILISSA) 200 MG TABS Take 1 tablet by mouth in the morning and at bedtime. 08/02/19  Yes Rubie Maid, MD  metoprolol succinate (TOPROL-XL) 50 MG 24 hr tablet Take 50 mg by mouth daily.  05/11/19  Yes [provider]  naproxen sodium (ALEVE) 220 MG tablet Take 220 mg by mouth.   Yes [provider]  spironolactone (ALDACTONE) 50 MG tablet Take 100 mg by mouth daily.    Yes [provider]  tretinoin (RETIN-A) 0.025 % cream Apply 1 application topically at bedtime.    Yes [provider]    Allergies: Allergies  Allergen Reactions   Bactrim [Sulfamethoxazole-Trimethoprim] Swelling   Latex Rash    Review of Systems: Review of Systems  Constitutional: Negative for chills and fever.  Respiratory: Negative for shortness of breath.    Cardiovascular: Negative for chest pain.  Gastrointestinal: Negative for abdominal pain, nausea and vomiting.  Psychiatric/Behavioral: The patient is nervous/anxious.     Physical Exam BP (!) 150/107    Pulse 81    Temp 99 F (37.2 C)    Resp 12    Ht 5\' 2"  (1.575 m)    Wt 209 lb 9.6 oz (95.1 kg)    SpO2 99%    BMI 38.34 kg/m  CONSTITUTIONAL: No acute distress HEENT:  Normocephalic, atraumatic, extraocular motion intact. RESPIRATORY:  Lungs are clear, and breath sounds are equal bilaterally. Normal respiratory effort without pathologic use of accessory muscles. CARDIOVASCULAR: Heart is regular without murmurs, gallops, or rubs. GI: The abdomen is soft, nondistended, nontender to palpation at this point.  Patient does have a reducible umbilical and right lower quadrant incisional hernias with a palpable mass in the right upper quadrant.  Patient has keloid scars over her prior incisions. NEUROLOGIC:  Motor and sensation is grossly normal.  Cranial nerves are grossly intact. PSYCH:  Alert and oriented to person, place and time. Affect is normal.   Assessment and Plan: This is a 34 y.o. female with 2 incisional hernias as well as an enlarging right upper quadrant mass suspicious for fibroid.  -The patient is currently scheduled for July 1 for a robotic assisted incisional hernia repair as well as excision of right upper quadrant mass with me and Dr. Marcelline Mates.  Discussed with her again the plan for surgery and reviewed the different scenarios for her surgery.  Discussed with her that ideally we would be able to keep everything robotically but that there is likelihood that we may have to do a midline incision to help better expose and resect the fibroid mass.  Nonetheless, the plan will still be to fix both of her hernias as well as use mesh to reinforce the repair.  Discussed with her the possibility for overnight stay depending on how everything goes.  Reviewed with her again the risks of bleeding,  infection, injury to surrounding structures and discussed with her her concerns and reassured her.  She understands the need still for Covid testing and she is already scheduled for this.  All of her questions were answered.  Face-to-face time spent with the patient and care providers was 25 minutes, with more than 50% of the time spent counseling, educating, and coordinating care of the patient.     Melvyn Neth, [facility]

## 2019-08-28 NOTE — Patient Instructions (Addendum)
As discussed, your surgery is scheduled for September 07, 2019.   Please contact the office if you have any questions or concerns.    Ventral Hernia  A ventral hernia is a bulge of tissue from inside the abdomen that pushes through a weak area of the muscles that form the front wall of the abdomen. The tissues inside the abdomen are inside a sac (peritoneum). These tissues include the small intestine, large intestine, and the fatty tissue that covers the intestines (omentum). Sometimes, the bulge that forms a hernia contains intestines. Other hernias contain only fat. Ventral hernias do not go away without surgical treatment. There are several types of ventral hernias. You may have:  A hernia at an incision site from previous abdominal surgery (incisional hernia).  A hernia just above the belly button (epigastric hernia), or at the belly button (umbilical hernia). These types of hernias can develop from heavy lifting or straining.  A hernia that comes and goes (reducible hernia). It may be visible only when you lift or strain. This type of hernia can be pushed back into the abdomen (reduced).  A hernia that traps abdominal tissue inside the hernia (incarcerated hernia). This type of hernia does not reduce.  A hernia that cuts off blood flow to the tissues inside the hernia (strangulated hernia). The tissues can start to die if this happens. This is a very painful bulge that cannot be reduced. A strangulated hernia is a medical emergency. What are the causes? This condition is caused by abdominal tissue putting pressure on an area of weakness in the abdominal muscles. What increases the risk? The following factors may make you more likely to develop this condition:  Being female.  Being 51 or older.  Being overweight or obese.  Having had previous abdominal surgery, especially if there was an infection after surgery.  Having had an injury to the abdominal wall.  Having had several  pregnancies.  Having a buildup of fluid inside the abdomen (ascites). What are the signs or symptoms? The only symptom of a ventral hernia may be a painless bulge in the abdomen. A reducible hernia may be visible only when you strain, cough, or lift. Other symptoms may include:  Dull pain.  A feeling of pressure. Signs and symptoms of a strangulated hernia may include:  Increasing pain.  Nausea and vomiting.  Pain when pressing on the hernia.  The skin over the hernia turning red or purple.  Constipation.  Blood in the stool (feces). How is this diagnosed? This condition may be diagnosed based on:  Your symptoms.  Your medical history.  A physical exam. You may be asked to cough or strain while standing. These actions increase the pressure inside your abdomen and force the hernia through the opening in your muscles. Your health care provider may try to reduce the hernia by pressing on it.  Imaging studies, such as an ultrasound or CT scan. How is this treated? This condition is treated with surgery. If you have a strangulated hernia, surgery is done as soon as possible. If your hernia is small and not incarcerated, you may be asked to lose some weight before surgery. Follow these instructions at home:  Follow instructions from your health care provider about eating or drinking restrictions.  If you are overweight, your health care provider may recommend that you increase your activity level and eat a healthier diet.  Do not lift anything that is heavier than 10 lb (4.5 kg).  Return to your  normal activities as told by your health care provider. Ask your health care provider what activities are safe for you. You may need to avoid activities that increase pressure on your hernia.  Take over-the-counter and prescription medicines only as told by your health care provider.  Keep all follow-up visits as told by your health care provider. This is important. Contact a health  care provider if:  Your hernia gets larger.  Your hernia becomes painful. Get help right away if:  Your hernia becomes increasingly painful.  You have pain along with any of the following: ? Changes in skin color in the area of the hernia. ? Nausea. ? Vomiting. ? Fever. Summary  A ventral hernia is a bulge of tissue from inside the abdomen that pushes through a weak area of the muscles that form the front wall of the abdomen.  This condition is treated with surgery, which may be urgent depending on your hernia.  Do not lift anything that is heavier than 10 lb (4.5 kg), and follow activity instructions from your health care provider. This information is not intended to replace advice given to you by your health care provider. Make sure you discuss any questions you have with your health care provider. Document Revised: 04/07/2017 Document Reviewed: 09/14/2016 Elsevier Patient Education  [place].

## 2019-09-05 ENCOUNTER — Other Ambulatory Visit
Admission: RE | Admit: 2019-09-05 | Discharge: 2019-09-05 | Disposition: A | Discharge status: Home / Self Care | Payer: BC Managed Care – PPO | Source: Ambulatory Visit | Attending: Surgery | Admitting: Surgery

## 2019-09-05 ENCOUNTER — Other Ambulatory Visit: Payer: Self-pay

## 2019-09-05 DIAGNOSIS — Z01812 Encounter for preprocedural laboratory examination: Secondary | ICD-10-CM | POA: Insufficient documentation

## 2019-09-05 DIAGNOSIS — Z20822 Contact with and (suspected) exposure to covid-19: Secondary | ICD-10-CM | POA: Insufficient documentation

## 2019-09-05 LAB — TYPE AND SCREEN
ABO/RH(D): O NEG
Antibody Screen: NEGATIVE
Extend sample reason: UNDETERMINED

## 2019-09-05 LAB — BASIC METABOLIC PANEL
Anion gap: 12 (ref 5–15)
BUN: 13 mg/dL (ref 6–20)
CO2: 22 mmol/L (ref 22–32)
Calcium: 9.4 mg/dL (ref 8.9–10.3)
Chloride: 102 mmol/L (ref 98–111)
Creatinine, Ser: 1.02 mg/dL — ABNORMAL HIGH (ref 0.44–1.00)
GFR calc Af Amer: >60 mL/min (ref >60)
GFR calc non Af Amer: >60 mL/min (ref >60)
Glucose, Bld: 92 mg/dL (ref 70–99)
Potassium: 3.8 mmol/L (ref 3.5–5.1)
Sodium: 136 mmol/L (ref 135–145)

## 2019-09-05 LAB — SARS CORONAVIRUS 2 (TAT 6-24 HRS): SARS Coronavirus 2: NEGATIVE

## 2019-09-07 ENCOUNTER — Encounter: Payer: Self-pay | Admitting: Surgery

## 2019-09-07 ENCOUNTER — Inpatient Hospital Stay
Admission: RE | Admit: 2019-09-07 | Discharge: 2019-09-08 | DRG: 354 | Disposition: A | Discharge status: Home / Self Care | Payer: BC Managed Care – PPO | Attending: Surgery | Admitting: Surgery

## 2019-09-07 ENCOUNTER — Inpatient Hospital Stay: Payer: BC Managed Care – PPO | Admitting: Certified Registered"

## 2019-09-07 ENCOUNTER — Encounter
Admission: RE | Disposition: A | Discharge status: Home / Self Care | Payer: Self-pay | Source: Home / Self Care | Attending: Surgery

## 2019-09-07 ENCOUNTER — Other Ambulatory Visit: Payer: Self-pay

## 2019-09-07 DIAGNOSIS — I10 Essential (primary) hypertension: Secondary | ICD-10-CM | POA: Diagnosis present

## 2019-09-07 DIAGNOSIS — N938 Other specified abnormal uterine and vaginal bleeding: Secondary | ICD-10-CM | POA: Diagnosis present

## 2019-09-07 DIAGNOSIS — R1900 Intra-abdominal and pelvic swelling, mass and lump, unspecified site: Secondary | ICD-10-CM

## 2019-09-07 DIAGNOSIS — Z91040 Latex allergy status: Secondary | ICD-10-CM

## 2019-09-07 DIAGNOSIS — Z79899 Other long term (current) drug therapy: Secondary | ICD-10-CM

## 2019-09-07 DIAGNOSIS — E669 Obesity, unspecified: Secondary | ICD-10-CM | POA: Diagnosis present

## 2019-09-07 DIAGNOSIS — Z8614 Personal history of Methicillin resistant Staphylococcus aureus infection: Secondary | ICD-10-CM

## 2019-09-07 DIAGNOSIS — K432 Incisional hernia without obstruction or gangrene: Principal | ICD-10-CM | POA: Diagnosis present

## 2019-09-07 DIAGNOSIS — Z881 Allergy status to other antibiotic agents status: Secondary | ICD-10-CM

## 2019-09-07 DIAGNOSIS — R1901 Right upper quadrant abdominal swelling, mass and lump: Secondary | ICD-10-CM

## 2019-09-07 DIAGNOSIS — K439 Ventral hernia without obstruction or gangrene: Secondary | ICD-10-CM

## 2019-09-07 DIAGNOSIS — Z20822 Contact with and (suspected) exposure to covid-19: Secondary | ICD-10-CM | POA: Diagnosis present

## 2019-09-07 DIAGNOSIS — N803 Endometriosis of pelvic peritoneum: Secondary | ICD-10-CM | POA: Diagnosis present

## 2019-09-07 DIAGNOSIS — G4733 Obstructive sleep apnea (adult) (pediatric): Secondary | ICD-10-CM | POA: Diagnosis present

## 2019-09-07 DIAGNOSIS — Z6838 Body mass index (BMI) 38.0-38.9, adult: Secondary | ICD-10-CM

## 2019-09-07 DIAGNOSIS — N7011 Chronic salpingitis: Secondary | ICD-10-CM | POA: Diagnosis present

## 2019-09-07 DIAGNOSIS — D250 Submucous leiomyoma of uterus: Secondary | ICD-10-CM | POA: Diagnosis present

## 2019-09-07 DIAGNOSIS — I422 Other hypertrophic cardiomyopathy: Secondary | ICD-10-CM | POA: Diagnosis present

## 2019-09-07 DIAGNOSIS — D251 Intramural leiomyoma of uterus: Secondary | ICD-10-CM | POA: Diagnosis present

## 2019-09-07 DIAGNOSIS — L910 Hypertrophic scar: Secondary | ICD-10-CM | POA: Diagnosis present

## 2019-09-07 HISTORY — PX: INCISIONAL HERNIA REPAIR: SHX193

## 2019-09-07 HISTORY — PX: MASS EXCISION: SHX2000

## 2019-09-07 LAB — POCT PREGNANCY, URINE: Preg Test, Ur: NEGATIVE

## 2019-09-07 SURGERY — REPAIR, HERNIA, INCISIONAL, LAPAROSCOPIC
Anesthesia: General

## 2019-09-07 MED ORDER — PROPOFOL 10 MG/ML IV BOLUS
INTRAVENOUS | Status: AC
  Filled 2019-09-07: qty 40

## 2019-09-07 MED ORDER — FENTANYL CITRATE (PF) 100 MCG/2ML IJ SOLN
INTRAMUSCULAR | Status: DC | PRN
  Administered 2019-09-07: 150 ug via INTRAVENOUS
  Administered 2019-09-07: 100 ug via INTRAVENOUS

## 2019-09-07 MED ORDER — MIDAZOLAM HCL 2 MG/2ML IJ SOLN
INTRAMUSCULAR | Status: AC
  Filled 2019-09-07: qty 2

## 2019-09-07 MED ORDER — LIDOCAINE HCL (PF) 1 % IJ SOLN
INTRAMUSCULAR | Status: AC
  Filled 2019-09-07: qty 30

## 2019-09-07 MED ORDER — KETOROLAC TROMETHAMINE 30 MG/ML IJ SOLN: 30.0000 mg | Freq: Once | INTRAMUSCULAR | Status: DC | PRN

## 2019-09-07 MED ORDER — METOPROLOL SUCCINATE ER 50 MG PO TB24
50.0000 mg | ORAL_TABLET | Freq: Every day | ORAL | Status: DC
  Administered 2019-09-07 – 2019-09-08 (×2): 50 mg via ORAL
  Filled 2019-09-07 (×2): qty 1

## 2019-09-07 MED ORDER — DEXAMETHASONE SODIUM PHOSPHATE 10 MG/ML IJ SOLN
INTRAMUSCULAR | Status: DC | PRN
  Administered 2019-09-07: 10 mg via INTRAVENOUS

## 2019-09-07 MED ORDER — ONDANSETRON HCL 4 MG/2ML IJ SOLN: 4.0000 mg | Freq: Four times a day (QID) | INTRAMUSCULAR | Status: DC | PRN

## 2019-09-07 MED ORDER — SEVOFLURANE IN SOLN
RESPIRATORY_TRACT | Status: AC
  Filled 2019-09-07: qty 250

## 2019-09-07 MED ORDER — HYDROCODONE-ACETAMINOPHEN 7.5-325 MG PO TABS: 1.0000 | ORAL_TABLET | Freq: Once | ORAL | Status: DC | PRN

## 2019-09-07 MED ORDER — POLYETHYLENE GLYCOL 3350 17 G PO PACK: 17.0000 g | PACK | Freq: Every day | ORAL | Status: DC | PRN

## 2019-09-07 MED ORDER — DROPERIDOL 2.5 MG/ML IJ SOLN
0.6250 mg | Freq: Once | INTRAMUSCULAR | Status: DC | PRN
  Filled 2019-09-07: qty 2

## 2019-09-07 MED ORDER — LIDOCAINE HCL (CARDIAC) PF 100 MG/5ML IV SOSY
PREFILLED_SYRINGE | INTRAVENOUS | Status: DC | PRN
  Administered 2019-09-07: 100 mg via INTRAVENOUS

## 2019-09-07 MED ORDER — DEXAMETHASONE SODIUM PHOSPHATE 10 MG/ML IJ SOLN
INTRAMUSCULAR | Status: AC
  Filled 2019-09-07: qty 1

## 2019-09-07 MED ORDER — BUPIVACAINE-EPINEPHRINE (PF) 0.25% -1:200000 IJ SOLN
INTRAMUSCULAR | Status: AC
  Filled 2019-09-07: qty 30

## 2019-09-07 MED ORDER — ROCURONIUM BROMIDE 10 MG/ML (PF) SYRINGE
PREFILLED_SYRINGE | INTRAVENOUS | Status: AC
  Filled 2019-09-07: qty 10

## 2019-09-07 MED ORDER — SPIRONOLACTONE 25 MG PO TABS
100.0000 mg | ORAL_TABLET | Freq: Every day | ORAL | Status: DC
  Administered 2019-09-08: 100 mg via ORAL
  Filled 2019-09-07: qty 4

## 2019-09-07 MED ORDER — PANTOPRAZOLE SODIUM 40 MG IV SOLR
40.0000 mg | Freq: Every day | INTRAVENOUS | Status: DC
  Administered 2019-09-07: 40 mg via INTRAVENOUS
  Filled 2019-09-07: qty 40

## 2019-09-07 MED ORDER — DEXMEDETOMIDINE HCL IN NACL 80 MCG/20ML IV SOLN
INTRAVENOUS | Status: AC
  Filled 2019-09-07: qty 20

## 2019-09-07 MED ORDER — ACETAMINOPHEN 500 MG PO TABS
ORAL_TABLET | ORAL | Status: AC
  Administered 2019-09-07: 1000 mg via ORAL
  Filled 2019-09-07: qty 2

## 2019-09-07 MED ORDER — LACTATED RINGERS IV SOLN
INTRAVENOUS | Status: DC
  Administered 2019-09-07: 50 mL/h via INTRAVENOUS

## 2019-09-07 MED ORDER — CEFAZOLIN SODIUM 1 G IJ SOLR
INTRAMUSCULAR | Status: AC
  Filled 2019-09-07: qty 20

## 2019-09-07 MED ORDER — MIDAZOLAM HCL 2 MG/2ML IJ SOLN
INTRAMUSCULAR | Status: DC | PRN
  Administered 2019-09-07: 2 mg via INTRAVENOUS

## 2019-09-07 MED ORDER — BUPIVACAINE HCL (PF) 0.5 % IJ SOLN
INTRAMUSCULAR | Status: AC
  Filled 2019-09-07: qty 30

## 2019-09-07 MED ORDER — GABAPENTIN 300 MG PO CAPS
ORAL_CAPSULE | ORAL | Status: AC
  Administered 2019-09-07: 300 mg via ORAL
  Filled 2019-09-07: qty 1

## 2019-09-07 MED ORDER — SUGAMMADEX SODIUM 200 MG/2ML IV SOLN
INTRAVENOUS | Status: DC | PRN
  Administered 2019-09-07: 200 mg via INTRAVENOUS

## 2019-09-07 MED ORDER — HYDROMORPHONE HCL 1 MG/ML IJ SOLN
INTRAMUSCULAR | Status: AC
  Filled 2019-09-07: qty 1

## 2019-09-07 MED ORDER — BUPIVACAINE LIPOSOME 1.3 % IJ SUSP
INTRAMUSCULAR | Status: DC | PRN
  Administered 2019-09-07: 20 mL

## 2019-09-07 MED ORDER — ONDANSETRON HCL 4 MG/2ML IJ SOLN
INTRAMUSCULAR | Status: DC | PRN
  Administered 2019-09-07: 4 mg via INTRAVENOUS

## 2019-09-07 MED ORDER — SODIUM CHLORIDE (PF) 0.9 % IJ SOLN
INTRAMUSCULAR | Status: AC
  Filled 2019-09-07: qty 50

## 2019-09-07 MED ORDER — METHYLENE BLUE 0.5 % INJ SOLN
INTRAVENOUS | Status: AC
  Filled 2019-09-07: qty 10

## 2019-09-07 MED ORDER — PROMETHAZINE HCL 25 MG/ML IJ SOLN: 6.2500 mg | INTRAMUSCULAR | Status: DC | PRN

## 2019-09-07 MED ORDER — KETOROLAC TROMETHAMINE 0.5 % OP SOLN
1.0000 [drp] | Freq: Three times a day (TID) | OPHTHALMIC | Status: DC | PRN
  Administered 2019-09-07: 1 [drp] via OPHTHALMIC
  Filled 2019-09-07: qty 3

## 2019-09-07 MED ORDER — ONDANSETRON 4 MG PO TBDP
4.0000 mg | ORAL_TABLET | Freq: Four times a day (QID) | ORAL | Status: DC | PRN
  Filled 2019-09-07: qty 1

## 2019-09-07 MED ORDER — ROCURONIUM BROMIDE 100 MG/10ML IV SOLN
INTRAVENOUS | Status: DC | PRN
  Administered 2019-09-07: 50 mg via INTRAVENOUS
  Administered 2019-09-07: 90 mg via INTRAVENOUS
  Administered 2019-09-07: 50 mg via INTRAVENOUS
  Administered 2019-09-07: 90 mg via INTRAVENOUS

## 2019-09-07 MED ORDER — CEFAZOLIN SODIUM-DEXTROSE 2-4 GM/100ML-% IV SOLN
INTRAVENOUS | Status: AC
  Administered 2019-09-07: 2 g via INTRAVENOUS
  Filled 2019-09-07: qty 100

## 2019-09-07 MED ORDER — ACETAMINOPHEN 500 MG PO TABS
1000.0000 mg | ORAL_TABLET | Freq: Four times a day (QID) | ORAL | Status: DC
  Administered 2019-09-07 – 2019-09-08 (×4): 1000 mg via ORAL
  Filled 2019-09-07 (×4): qty 2

## 2019-09-07 MED ORDER — PHENYLEPHRINE HCL (PRESSORS) 10 MG/ML IV SOLN
INTRAVENOUS | Status: DC | PRN
  Administered 2019-09-07: 100 ug via INTRAVENOUS
  Administered 2019-09-07: 200 ug via INTRAVENOUS
  Administered 2019-09-07 (×5): 100 ug via INTRAVENOUS
  Administered 2019-09-07 (×2): 200 ug via INTRAVENOUS

## 2019-09-07 MED ORDER — CEFAZOLIN SODIUM-DEXTROSE 2-4 GM/100ML-% IV SOLN: 2.0000 g | INTRAVENOUS | Status: AC

## 2019-09-07 MED ORDER — ACETAMINOPHEN 500 MG PO TABS: 1000.0000 mg | ORAL_TABLET | ORAL | Status: AC

## 2019-09-07 MED ORDER — FAMOTIDINE 20 MG PO TABS
ORAL_TABLET | ORAL | Status: AC
  Administered 2019-09-07: 20 mg via ORAL
  Filled 2019-09-07: qty 1

## 2019-09-07 MED ORDER — PHENYLEPHRINE HCL-NACL 10-0.9 MG/250ML-% IV SOLN
INTRAVENOUS | Status: DC | PRN
Start: 2019-09-07 — End: 2019-09-07
  Administered 2019-09-07: 30 ug/min via INTRAVENOUS

## 2019-09-07 MED ORDER — LIDOCAINE HCL (PF) 2 % IJ SOLN
INTRAMUSCULAR | Status: AC
  Filled 2019-09-07: qty 5

## 2019-09-07 MED ORDER — VASOPRESSIN 20 UNIT/ML IV SOLN
INTRAVENOUS | Status: AC
  Filled 2019-09-07: qty 1

## 2019-09-07 MED ORDER — CEFAZOLIN SODIUM 1 G IJ SOLR
INTRAMUSCULAR | Status: AC
  Filled 2019-09-07: qty 10

## 2019-09-07 MED ORDER — OXYCODONE HCL 5 MG PO TABS
5.0000 mg | ORAL_TABLET | ORAL | Status: DC | PRN
  Administered 2019-09-07 – 2019-09-08 (×2): 10 mg via ORAL
  Filled 2019-09-07 (×2): qty 2

## 2019-09-07 MED ORDER — HYDROMORPHONE HCL 1 MG/ML IJ SOLN
INTRAMUSCULAR | Status: DC | PRN
  Administered 2019-09-07: .5 mg via INTRAVENOUS

## 2019-09-07 MED ORDER — FENTANYL CITRATE (PF) 250 MCG/5ML IJ SOLN
INTRAMUSCULAR | Status: AC
  Filled 2019-09-07: qty 5

## 2019-09-07 MED ORDER — CHLORHEXIDINE GLUCONATE 0.12 % MT SOLN
15.0000 mL | Freq: Once | OROMUCOSAL | Status: AC
  Administered 2019-09-07: 15 mL via OROMUCOSAL

## 2019-09-07 MED ORDER — FAMOTIDINE 20 MG PO TABS: 20.0000 mg | ORAL_TABLET | Freq: Once | ORAL | Status: AC

## 2019-09-07 MED ORDER — LACTATED RINGERS IV SOLN: INTRAVENOUS | Status: DC

## 2019-09-07 MED ORDER — CEFAZOLIN SODIUM-DEXTROSE 2-4 GM/100ML-% IV SOLN
2.0000 g | Freq: Three times a day (TID) | INTRAVENOUS | Status: DC
  Administered 2019-09-07 – 2019-09-08 (×2): 2 g via INTRAVENOUS
  Filled 2019-09-07 (×3): qty 100

## 2019-09-07 MED ORDER — CEFAZOLIN SODIUM-DEXTROSE 2-3 GM-%(50ML) IV SOLR
INTRAVENOUS | Status: DC | PRN
Start: 2019-09-07 — End: 2019-09-07
  Administered 2019-09-07 (×2): 2 g via INTRAVENOUS

## 2019-09-07 MED ORDER — HYDROMORPHONE HCL 1 MG/ML IJ SOLN: 0.5000 mg | INTRAMUSCULAR | Status: DC | PRN

## 2019-09-07 MED ORDER — GABAPENTIN 300 MG PO CAPS: 300.0000 mg | ORAL_CAPSULE | ORAL | Status: AC

## 2019-09-07 MED ORDER — DEXMEDETOMIDINE HCL IN NACL 400 MCG/100ML IV SOLN
INTRAVENOUS | Status: DC | PRN
Start: 2019-09-07 — End: 2019-09-07
  Administered 2019-09-07 (×2): 8 ug via INTRAVENOUS

## 2019-09-07 MED ORDER — ONDANSETRON HCL 4 MG/2ML IJ SOLN
INTRAMUSCULAR | Status: AC
  Filled 2019-09-07: qty 2

## 2019-09-07 MED ORDER — CHLORHEXIDINE GLUCONATE CLOTH 2 % EX PADS
6.0000 | MEDICATED_PAD | Freq: Once | CUTANEOUS | Status: AC
  Administered 2019-09-07: 6 via TOPICAL

## 2019-09-07 MED ORDER — BUPIVACAINE-EPINEPHRINE (PF) 0.5% -1:200000 IJ SOLN
INTRAMUSCULAR | Status: AC
  Filled 2019-09-07: qty 30

## 2019-09-07 MED ORDER — ORAL CARE MOUTH RINSE: 15.0000 mL | Freq: Once | OROMUCOSAL | Status: AC

## 2019-09-07 MED ORDER — BUPIVACAINE LIPOSOME 1.3 % IJ SUSP: 20.0000 mL | Freq: Once | INTRAMUSCULAR | Status: DC

## 2019-09-07 MED ORDER — MEPERIDINE HCL 50 MG/ML IJ SOLN: 6.2500 mg | INTRAMUSCULAR | Status: DC | PRN

## 2019-09-07 MED ORDER — ACETAMINOPHEN 325 MG PO TABS: 325.0000 mg | ORAL_TABLET | ORAL | Status: DC | PRN

## 2019-09-07 MED ORDER — ENOXAPARIN SODIUM 40 MG/0.4ML ~~LOC~~ SOLN
40.0000 mg | SUBCUTANEOUS | Status: DC
  Administered 2019-09-08: 40 mg via SUBCUTANEOUS
  Filled 2019-09-07: qty 0.4

## 2019-09-07 MED ORDER — FENTANYL CITRATE (PF) 100 MCG/2ML IJ SOLN: 25.0000 ug | INTRAMUSCULAR | Status: DC | PRN

## 2019-09-07 MED ORDER — KETOROLAC TROMETHAMINE 30 MG/ML IJ SOLN
INTRAMUSCULAR | Status: DC | PRN
  Administered 2019-09-07: 30 mg via INTRAVENOUS

## 2019-09-07 MED ORDER — KETOROLAC TROMETHAMINE 30 MG/ML IJ SOLN
30.0000 mg | Freq: Four times a day (QID) | INTRAMUSCULAR | Status: DC
  Administered 2019-09-08 (×2): 30 mg via INTRAVENOUS
  Filled 2019-09-07 (×2): qty 1

## 2019-09-07 MED ORDER — BUPIVACAINE-EPINEPHRINE (PF) 0.5% -1:200000 IJ SOLN
INTRAMUSCULAR | Status: DC | PRN
  Administered 2019-09-07: 30 mL

## 2019-09-07 MED ORDER — PROPOFOL 10 MG/ML IV BOLUS
INTRAVENOUS | Status: DC | PRN
  Administered 2019-09-07: 150 mg via INTRAVENOUS

## 2019-09-07 MED ORDER — ELAGOLIX SODIUM 200 MG PO TABS
1.0000 | ORAL_TABLET | Freq: Two times a day (BID) | ORAL | Status: DC
  Administered 2019-09-08: 1 via ORAL

## 2019-09-07 MED ORDER — ACETAMINOPHEN 160 MG/5ML PO SOLN
325.0000 mg | ORAL | Status: DC | PRN
  Filled 2019-09-07: qty 20.3

## 2019-09-07 MED ORDER — BUPIVACAINE LIPOSOME 1.3 % IJ SUSP
INTRAMUSCULAR | Status: AC
  Filled 2019-09-07: qty 20

## 2019-09-07 SURGICAL SUPPLY — 120 items
ADH SKN CLS APL DERMABOND .7 (GAUZE/BANDAGES/DRESSINGS) ×2
APL PRP STRL LF DISP 70% ISPRP (MISCELLANEOUS) ×2
BAG DRN RND TRDRP ANRFLXCHMBR (UROLOGICAL SUPPLIES)
BAG URINE DRAIN 2000ML AR STRL (UROLOGICAL SUPPLIES) ×1 IMPLANT
BLADE SURG SZ11 CARB STEEL (BLADE) ×2 IMPLANT
BRR ADH 6X5 SEPRAFILM 1 SHT (MISCELLANEOUS)
CANISTER SUCT 1200ML W/VALVE (MISCELLANEOUS) ×5 IMPLANT
CANNULA REDUC XI 12-8 STAPL (CANNULA) ×6
CANNULA REDUC XI 12-8MM STAPL (CANNULA) ×2
CANNULA REDUCER 12-8 DVNC XI (CANNULA) ×3 IMPLANT
CATH FOLEY 2WAY  5CC 16FR (CATHETERS)
CATH FOLEY 2WAY 5CC 16FR (CATHETERS)
CATH URTH 16FR FL 2W BLN LF (CATHETERS) ×1 IMPLANT
CHLORAPREP W/TINT 26 (MISCELLANEOUS) ×5 IMPLANT
COVER TIP SHEARS 8 DVNC (MISCELLANEOUS) ×3 IMPLANT
COVER TIP SHEARS 8MM DA VINCI (MISCELLANEOUS) ×4
COVER WAND RF STERILE (DRAPES) ×8 IMPLANT
DEFOGGER SCOPE WARMER CLEARIFY (MISCELLANEOUS) ×5 IMPLANT
DERMABOND ADVANCED (GAUZE/BANDAGES/DRESSINGS) ×2
DERMABOND ADVANCED .7 DNX12 (GAUZE/BANDAGES/DRESSINGS) ×3 IMPLANT
DRAPE 3/4 80X56 (DRAPES) ×2 IMPLANT
DRAPE ARM DVNC X/XI (DISPOSABLE) ×11 IMPLANT
DRAPE COLUMN DVNC XI (DISPOSABLE) ×3 IMPLANT
DRAPE DA VINCI XI ARM (DISPOSABLE) ×16
DRAPE DA VINCI XI COLUMN (DISPOSABLE) ×4
DRAPE LAPAROTOMY 100X77 ABD (DRAPES) ×4 IMPLANT
DRAPE LAPAROTOMY TRNSV 106X77 (MISCELLANEOUS) ×4 IMPLANT
DRAPE LEGGINS SURG 28X43 STRL (DRAPES) ×3 IMPLANT
DRAPE UNDER BUTTOCK W/FLU (DRAPES) ×3 IMPLANT
DRSG TELFA 3X8 NADH (GAUZE/BANDAGES/DRESSINGS) IMPLANT
ELECT BLADE 6 FLAT ULTRCLN (ELECTRODE) ×4 IMPLANT
ELECT CAUTERY BLADE 6.4 (BLADE) ×4 IMPLANT
ELECT REM PT RETURN 9FT ADLT (ELECTROSURGICAL) ×4
ELECTRODE REM PT RTRN 9FT ADLT (ELECTROSURGICAL) ×3 IMPLANT
FILTER LAP SMOKE EVAC STRL (MISCELLANEOUS) ×1 IMPLANT
GAUZE SPONGE 4X4 12PLY STRL (GAUZE/BANDAGES/DRESSINGS) ×4 IMPLANT
GLOVE BIO SURGEON STRL SZ 6.5 (GLOVE) ×4 IMPLANT
GLOVE BIO SURGEONS STRL SZ 6.5 (GLOVE)
GLOVE INDICATOR 7.0 STRL GRN (GLOVE) ×16 IMPLANT
GLOVE SURG SYN 7.0 (GLOVE) ×16 IMPLANT
GLOVE SURG SYN 7.0 PF PI (GLOVE) ×2 IMPLANT
GLOVE SURG SYN 7.5  E (GLOVE) ×16
GLOVE SURG SYN 7.5 E (GLOVE) ×8 IMPLANT
GLOVE SURG SYN 7.5 PF PI (GLOVE) ×2 IMPLANT
GOWN STRL REUS W/ TWL LRG LVL3 (GOWN DISPOSABLE) ×11 IMPLANT
GOWN STRL REUS W/TWL LRG LVL3 (GOWN DISPOSABLE) ×16
GRASPER SUT TROCAR 14GX15 (MISCELLANEOUS) ×2 IMPLANT
IRRIGATION STRYKERFLOW (MISCELLANEOUS) ×1 IMPLANT
IRRIGATOR STRYKERFLOW (MISCELLANEOUS)
IV NS 1000ML (IV SOLUTION)
IV NS 1000ML BAXH (IV SOLUTION) ×1 IMPLANT
KIT IMAGING PINPOINTPAQ (MISCELLANEOUS) IMPLANT
KIT PINK PAD W/HEAD ARE REST (MISCELLANEOUS) ×4
KIT PINK PAD W/HEAD ARM REST (MISCELLANEOUS) ×3 IMPLANT
KIT TURNOVER CYSTO (KITS) ×4 IMPLANT
LABEL OR SOLS (LABEL) ×5 IMPLANT
MANIPULATOR UTERINE 4.5 ZUMI (MISCELLANEOUS) IMPLANT
MANIPULATOR VCARE LG CRV RETR (MISCELLANEOUS) IMPLANT
MANIPULATOR VCARE SML CRV RETR (MISCELLANEOUS) IMPLANT
MANIPULATOR VCARE STD CRV RETR (MISCELLANEOUS) IMPLANT
MESH VENT LT ST 10.2X15.2CM EL (Mesh General) ×3 IMPLANT
MESH VENTRALIGHT ST 4X6IN (Mesh General) ×3 IMPLANT
NDL INSUFFLATION 14GA 120MM (NEEDLE) ×1 IMPLANT
NEEDLE HYPO 22GX1.5 SAFETY (NEEDLE) ×4 IMPLANT
NEEDLE INSUFFLATION 14GA 120MM (NEEDLE) ×4 IMPLANT
NEEDLE VERESS 14GA 120MM (NEEDLE) ×1 IMPLANT
NS IRRIG 1000ML POUR BTL (IV SOLUTION) ×1 IMPLANT
OBTURATOR OPTICAL STANDARD 8MM (TROCAR) ×4
OBTURATOR OPTICAL STND 8 DVNC (TROCAR) ×2
OBTURATOR OPTICALSTD 8 DVNC (TROCAR) ×2 IMPLANT
OCCLUDER COLPOPNEUMO (BALLOONS) ×1 IMPLANT
PACK BASIN MAJOR (MISCELLANEOUS) ×4 IMPLANT
PACK GYN LAPAROSCOPIC (MISCELLANEOUS) ×4 IMPLANT
PACK LAP CHOLECYSTECTOMY (MISCELLANEOUS) ×1 IMPLANT
PAD DRESSING TELFA 3X8 NADH (GAUZE/BANDAGES/DRESSINGS) ×1 IMPLANT
PAD OB MATERNITY 4.3X12.25 (PERSONAL CARE ITEMS) ×4 IMPLANT
PAD PREP 24X41 OB/GYN DISP (PERSONAL CARE ITEMS) ×1 IMPLANT
PENCIL ELECTRO HAND CTR (MISCELLANEOUS) ×4 IMPLANT
RETRACTOR RING XSMALL (MISCELLANEOUS) ×1 IMPLANT
RTRCTR WOUND ALEXIS 13CM XS SH (MISCELLANEOUS) ×4
SCISSORS METZENBAUM CVD 33 (INSTRUMENTS) ×1 IMPLANT
SEAL CANN UNIV 5-8 DVNC XI (MISCELLANEOUS) ×7 IMPLANT
SEAL XI 5MM-8MM UNIVERSAL (MISCELLANEOUS) ×8
SEALER VESSEL DA VINCI XI (MISCELLANEOUS)
SEALER VESSEL EXT DVNC XI (MISCELLANEOUS) IMPLANT
SEPRAFILM MEMBRANE 5X6 (MISCELLANEOUS) IMPLANT
SET CYSTO W/LG BORE CLAMP LF (SET/KITS/TRAYS/PACK) ×1 IMPLANT
SET TUBE SMOKE EVAC HIGH FLOW (TUBING) ×4 IMPLANT
SOLUTION ELECTROLUBE (MISCELLANEOUS) ×5 IMPLANT
SPONGE LAP 18X18 RF (DISPOSABLE) ×8 IMPLANT
STAPLER CANNULA SEAL DVNC XI (STAPLE) ×2 IMPLANT
STAPLER CANNULA SEAL XI (STAPLE) ×4
STAPLER INSORB 30 2030 C-SECTI (MISCELLANEOUS) IMPLANT
STAPLER SKIN PROX 35W (STAPLE) ×4 IMPLANT
SUT DVC VLOC 180 0 12IN GS21 (SUTURE)
SUT MNCRL 4-0 (SUTURE) ×4
SUT MNCRL 4-0 27XMFL (SUTURE) ×2
SUT PDS AB 1 TP1 96 (SUTURE) IMPLANT
SUT STRATAFIX PDS 30 CT-1 (SUTURE) ×7 IMPLANT
SUT STRATAFIX SPIRAL PDS+ 0 30 (SUTURE) ×3 IMPLANT
SUT VIC AB 0 CT1 27 (SUTURE) ×4
SUT VIC AB 0 CT1 27XCR 8 STRN (SUTURE) ×2 IMPLANT
SUT VIC AB 0 CT1 36 (SUTURE) ×8 IMPLANT
SUT VIC AB 2-0 CT1 (SUTURE) IMPLANT
SUT VIC AB 2-0 CT1 27 (SUTURE)
SUT VIC AB 2-0 CT1 TAPERPNT 27 (SUTURE) ×1 IMPLANT
SUT VICRYL 0 AB UR-6 (SUTURE) ×6 IMPLANT
SUT VLOC 90 2/L VL 12 GS22 (SUTURE) ×8 IMPLANT
SUTURE DVC VLC 180 0 12IN GS21 (SUTURE) ×1 IMPLANT
SUTURE MNCRL 4-0 27XMF (SUTURE) ×3 IMPLANT
SYR 10ML LL (SYRINGE) ×4 IMPLANT
SYR 50ML LL SCALE MARK (SYRINGE) ×1 IMPLANT
SYR BULB IRRIG 60ML STRL (SYRINGE) ×7 IMPLANT
TRAY FOLEY MTR SLVR 16FR STAT (SET/KITS/TRAYS/PACK) ×4 IMPLANT
TRAY FOLEY SLVR 16FR LF STAT (SET/KITS/TRAYS/PACK) ×4 IMPLANT
TRAY PREP VAG/GEN (MISCELLANEOUS) ×4 IMPLANT
TROCAR 130MM GELPORT  DAV (MISCELLANEOUS) ×4 IMPLANT
TROCAR ENDO BLADELESS 11MM (ENDOMECHANICALS) ×1 IMPLANT
TROCAR XCEL 12X100 BLDLESS (ENDOMECHANICALS) ×1 IMPLANT
TUBING EVAC SMOKE HEATED PNEUM (TUBING) ×1 IMPLANT

## 2019-09-07 NOTE — Anesthesia Procedure Notes (Signed)
Procedure Name: Intubation Performed by: Sissy Goetzke R, CRNA Pre-anesthesia Checklist: Patient identified, Emergency Drugs available, Suction available and Patient being monitored Patient Re-evaluated:Patient Re-evaluated prior to induction Oxygen Delivery Method: Circle system utilized Preoxygenation: Pre-oxygenation with 100% oxygen Induction Type: IV induction Ventilation: Mask ventilation without difficulty and Oral airway inserted - appropriate to patient size Laryngoscope Size: Mac and 3 Grade View: Grade II Tube type: Oral Tube size: 7.0 mm Number of attempts: 1 Airway Equipment and Method: Oral airway Placement Confirmation: ETT inserted through vocal cords under direct vision,  positive ETCO2 and breath sounds checked- equal and bilateral Secured at: 21 cm Tube secured with: Tape Dental Injury: Teeth and Oropharynx as per pre-operative assessment        

## 2019-09-07 NOTE — H&P (Addendum)
GYNECOLOGY PREOPERATIVE HISTORY AND PHYSICAL   Subjective:  Lynn Boyd is a 34 y.o. G0P0000 here for surgical management of ventral incisional hernia x 2, RUQ mass (suspected for fibroid based on CT guided biopsy), adhesiolysis, and fibroid uterus. Also with prior history of endometriosis and dysfunctional uterine bleeding, currently managed on Orilissa.  No significant preoperative concerns.  Proposed surgery:  Robotic ventral hernia repair x 2 (to be performed by General Surgery, Dr. Suzie Portela), lysis of adhesions, and excision of RUQ mass/fibroid.    Pertinent Gynecological History: Menses: not having menses (currently controlled on Orilissa) Bleeding: none. History of abnormal uterine bleeding Contraception: none Last pap: normal Date: 07/09/2017   Past Medical History:  Diagnosis Date  . Anxiety   . Cardiomyopathy ([place])    HYPERTROPHIC  . Chronic blood loss anemia    iron infusions  . Depression   . Endometriosis   . Enlarged heart   . Fibroid uterus   . Heart murmur   . Herpes simplex virus (HSV) infection   . History of blood transfusion 2016   2 units, given for chronic blood loss anemia   . Hx MRSA infection    wound that required a drain  . Hypertension   . Sleep apnea    OSA, not currently using CPAP  . TOA (tubo-ovarian abscess) 11/28/2014  . Vaginal Pap smear, abnormal 2017   ASCUS with HPV+   Past Surgical History:  Procedure Laterality Date  . endometreoisis     surgery  . IR IMAGE GUIDED DRAINAGE BY PERCUTANEOUS CATHETER  2016   left tubo-ovarian abscess, performed at [place]  05/2010   [facility]  . ROBOT ASSISTED MYOMECTOMY N/A 08/24/2018   Procedure: ROBOTIC ASSISTED MYOMECTOMY;  Surgeon: Rubie Maid, MD;  Location: [facility] ORS;  Service: Gynecology;  Laterality: N/A;  . ROBOTIC ASSISTED LAPAROSCOPIC LYSIS OF ADHESION N/A 08/24/2018   Procedure: ROBOTIC ASSISTED LAPAROSCOPIC LYSIS OF ADHESION;  Surgeon: Rubie Maid, MD;  Location: [facility] ORS;  Service: Gynecology;  Laterality: N/A;   OB History  Gravida Para Term Preterm AB Living  0 0 0 0 0 0  SAB TAB Ectopic Multiple Live Births  0 0 0 0 0  Patient denies any other pertinent gynecologic issues.  Family History  Problem Relation Age of Onset  . Hypertension Mother   . Diabetes Mother   . Hypertension Father    Social History   Socioeconomic History  . Marital status: Single    Spouse name: Not on file  . Number of children: Not on file  . Years of education: Not on file  . Highest education level: Not on file  Occupational History  . Occupation: Training and development officer: [employer]  Tobacco Use  . Smoking status: Never Smoker  . Smokeless tobacco: Never Used  Vaping Use  . Vaping Use: Never used  Substance and Sexual Activity  . Alcohol use: Yes    Alcohol/week: 0.0 standard drinks    Comment: socially and occa  . Drug use: No  . Sexual activity: Yes    Birth control/protection: None  Other Topics Concern  . Not on file  Social History Narrative  . Not on file   Social Determinants of Health   Financial Resource Strain:   . Difficulty of Paying Living Expenses:   Food Insecurity:   . Worried About Charity fundraiser in the Last Year:   . [employer]  in the Last Year:   Transportation Needs:   . Film/video editor (Medical):   Marland Kitchen Lack of Transportation (Non-Medical):   Physical Activity:   . Days of Exercise per Week:   . Minutes of Exercise per Session:   Stress:   . Feeling of Stress :   Social Connections:   . Frequency of Communication with Friends and Family:   . Frequency of Social Gatherings with Friends and Family:   . Attends Religious Services:   . Active Member of Clubs or Organizations:   . Attends Archivist Meetings:   Marland Kitchen Marital Status:   Intimate Partner Violence:   . Fear of Current or Ex-Partner:   . Emotionally Abused:   Marland Kitchen Physically Abused:   . Sexually Abused:     No current facility-administered medications on file prior to encounter.   Current Outpatient Medications on File Prior to Encounter  Medication Sig Dispense Refill  . adapalene (DIFFERIN) 0.1 % gel Apply 1 application topically every other day.    . cholecalciferol (VITAMIN D3) 25 MCG (1000 UT) tablet Take 1,000 Units by mouth daily.     . clindamycin (CLINDAGEL) 1 % gel Apply 1 application topically daily.     . metoprolol succinate (TOPROL-XL) 50 MG 24 hr tablet Take 50 mg by mouth daily.    Marland Kitchen spironolactone (ALDACTONE) 50 MG tablet Take 100 mg by mouth daily.     Marland Kitchen tretinoin (RETIN-A) 0.025 % cream Apply 1 application topically at bedtime.      Allergies  Allergen Reactions  . Bactrim [Sulfamethoxazole-Trimethoprim] Swelling  . Latex Rash      Review of Systems Constitutional: No recent fever/chills/sweats Respiratory: No recent cough/bronchitis Cardiovascular: No chest pain Gastrointestinal: No recent nausea/vomiting/diarrhea Genitourinary: No UTI symptoms Hematologic/lymphatic:No history of coagulopathy or recent blood thinner use    Objective:   Blood pressure (!) 141/105, pulse 80, temperature (!) 97.5 F (36.4 C), temperature source Tympanic, resp. rate 17, height 5\' 2"  (1.575 m), weight 95.1 kg, SpO2 100 %. CONSTITUTIONAL: Well-developed, well-nourished female in no acute distress.  HENT:  Normocephalic, atraumatic, External right and left ear normal. Oropharynx is clear and moist EYES: Conjunctivae and EOM are normal. Pupils are equal, round, and reactive to light. No scleral icterus.  NECK: Normal range of motion, supple, no masses SKIN: Skin is warm and dry. No rash noted. Not diaphoretic. No erythema. No pallor. NEUROLOGIC: Alert and oriented to person, place, and time. Normal reflexes, muscle tone coordination. No cranial nerve deficit noted. PSYCHIATRIC: Normal mood and affect. Normal behavior. Normal judgment and thought content. CARDIOVASCULAR: Normal heart  rate noted, regular rhythm RESPIRATORY: Effort and breath sounds normal, no problems with respiration noted ABDOMEN: Soft, nontender, nondistended. PELVIC: Deferred MUSCULOSKELETAL: Normal range of motion. No edema and no tenderness. 2+ distal pulses.    Labs: Results for orders placed or performed during the hospital encounter of 09/07/19 (from the past 336 hour(s))  Pregnancy, urine POC   Collection Time: 09/07/19  7:48 AM  Result Value Ref Range   Preg Test, Ur NEGATIVE NEGATIVE  Results for orders placed or performed during the hospital encounter of 09/05/19 (from the past 336 hour(s))  SARS CORONAVIRUS 2 (TAT 6-24 HRS) Nasopharyngeal Nasopharyngeal Swab   Collection Time: 09/05/19 12:15 PM   Specimen: Nasopharyngeal Swab  Result Value Ref Range   SARS Coronavirus 2 NEGATIVE NEGATIVE  Basic metabolic panel   Collection Time: 09/05/19 12:15 PM  Result Value Ref Range   Sodium 136 135 -  145 mmol/L   Potassium 3.8 3.5 - 5.1 mmol/L   Chloride 102 98 - 111 mmol/L   CO2 22 22 - 32 mmol/L   Glucose, Bld 92 70 - 99 mg/dL   BUN 13 6 - 20 mg/dL   Creatinine, Ser 1.02 (H) 0.44 - 1.00 mg/dL   Calcium 9.4 8.9 - 10.3 mg/dL   GFR calc non Af Amer >60 >60 mL/min   GFR calc Af Amer >60 >60 mL/min   Anion gap 12 5 - 15  Type and screen   Collection Time: 09/05/19 12:15 PM  Result Value Ref Range   ABO/RH(D) O NEG    Antibody Screen NEG    Sample Expiration 09/08/2019,2359    Extend sample reason      PTHF FORM INCOMPLETE, UNABLE TO EXTEND Performed at [facility], [address] [place], [place] [address]      Imaging Studies: US PELVIS (TRANSABDOMINAL ONLY) Patient Name: Breyah Akhter DOB: 1985-06-09 MRN: [id number] ULTRASOUND REPORT  Location: [facility] Date of Service: 08/02/2019   Indications:Enlarged Uterus   Findings:  The uterus is anteverted and measures 13. X 7.6 x 8.4 cm. Echo texture is heterogenous with evidence of focal  masses. fibroids measuring: Fibroid 1:SM 3.3 x 2.4 x 3.3 cm Fibroid 2:Anterior IM 4.8 x 3.3 x 3.8 cm Fibroid 3: Posterior IM 4.4 x 3.8x 3.8 cm  The Endometrium measures 4 mm.  Right Ovary measures 3.9 x 2.1 X 25.4 cm. It is normal in appearance. Left Ovary measures 34.3 x 2.8 x 2.7 cm. It is normal in appearance. Left hydrosalpinx.  Survey of the adnexa demonstrates no adnexal masses. There is no free fluid in the cul de sac.  Impression: 1. Left hydrosalpinx 2. Enlarge fibroid uterus 3. Submucosal and intramural fibroids present.  4. Free fluid mostly left adnexal area.  Recommendations: 1.Clinical correlation with the patient's History and Physical Exam.  Jenine M. Albertine Grates    RDMS  I have reviewed this study and agree with documented findings.   Rubie Maid, MD [facility]   CLINICAL DATA:  Abdominal pain, hernia suspected.  EXAM: CT ABDOMEN AND PELVIS WITH CONTRAST  TECHNIQUE: Multidetector CT imaging of the abdomen and pelvis was performed using the standard protocol following bolus administration of intravenous contrast.  CONTRAST:  127mL OMNIPAQUE IOHEXOL 300 MG/ML  SOLN  COMPARISON:  CT 11/28/2014  FINDINGS: Lower chest: Incidental imaging of the lung bases is unremarkable.  Hepatobiliary: No focal, intrahepatic lesion. Lobular hepatic contours with suggestion of background steatosis.  Portal vein is patent.  Gallbladder is unremarkable and there are no signs of biliary ductal dilation. See below for perihepatic/subhepatic mass.  Pancreas: Pancreas is normal without focal lesion or ductal dilation.  Spleen: Spleen is normal size without focal lesion.  Adrenals/Urinary Tract: Adrenal glands are normal.  Smooth renal contours without evidence of hydronephrosis.  Stomach/Bowel: Bowel loops displaced by a large mass in the right hemiabdomen measuring 12.5 x 8.7 x 9.9 cm previously approximately 5.6 x 4.7 cm in greatest  axial dimension. Extensive collateral pathways in the upper abdomen appear to supply this mass at numerous locations including upper abdominal contributions.  Bowel contained in a right rectus hernia/spigelian hernia. Aperture of the hernia is approximately 2 cm. Small amount of fluid adjacent to bowel loops. No sign of obstruction. The appendix is normal.  Vascular/Lymphatic: Patent abdominal vasculature. No adenopathy.  No pelvic lymphadenopathy.  Reproductive: Defect in the uterine fundus from previous myomectomy. The large submucosal  fibroid with central low attenuation at the lower margin of the myoma ectomy defect measuring approximately 3.5 x 3.6 cm. The smaller intracavitary fibroid measuring 3.3 x 2.5 cm.  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 Pelvic ascites around the uterus. Cystic changes in the left adnexa not well assessed in the current context.  Other: In addition to the right lower quadrant abdominal wall hernia there is an umbilical hernia that contains fat and a small amount of fluid. This hernia has enlarged slightly and is moderate size with a defect approximately 3.8 cm in axial dimension and craniocaudal span of approximately 4.4 cm.  Nodularity and or loculated fluid anterior to small bowel loops serves as an example of some of the peritoneal/mesenteric abnormality seen on today's scan (image 48, series 2) 1.9 by 1.4 cm.  Musculoskeletal: No acute bone finding or destructive bone process.  IMPRESSION: 1. Interval development of a right lower quadrant hernia perhaps the site of previous laparoscopic access, containing bowel and without signs of obstruction. 2. Diffuse mesenteric thickening indicative of peritoneal disease in this patient with reported history of endometriosis. 3. Enlarging mass in the right upper quadrant potentially  peritoneal implant from previous leiomyoma morcellation. Considerable enlargement since 2016. Correlate with any history of biopsy or surgery in this area since that time for any pathology results which may exist. Finding is not typical or suggestive of endometrioma. Malignant transformation of leiomyoma and or deposit in the peritoneum is also considered in addition to other primary neoplasms of the abdominal mesentery. Blood supply is derived from upper abdominal collaterals primarily. Findings related to prior myomectomy with presumed degeneration of fibroids in the uterus as described. No frankly malignant features are noted within the uterus though comparison with previous, interval imaging from [facility] and [facility] may be helpful. 4. Cystic changes in the left adnexa, nonspecific. Consider correlation with ultrasound for pelvic findings or prior imaging as possible. 5. Pelvic ascites and mesenteric and peritoneal thickening. 6. Question mild hepatic steatosis.  These results were called by telephone at the time of interpretation on 06/12/2019 at 3:49 pm to provider [employer] , who verbally acknowledged these results.   Electronically Signed   By: Zetta Bills M.D.   On: 06/12/2019 15:51   INDICATION: Enlarging indeterminate right intra-abdominal mass  EXAM: CT-GUIDED BIOPSY RIGHT INTRA-ABDOMINAL MASS  MEDICATIONS: 1% LIDOCAINE LOCAL  ANESTHESIA/SEDATION: 2.0 mg IV Versed; 100 mcg IV Fentanyl  Moderate Sedation Time:  14 MINUTES  The patient was continuously monitored during the procedure by the interventional radiology nurse under my direct supervision.  PROCEDURE: The procedure, risks, benefits, and alternatives were explained to the patient. Questions regarding the procedure were encouraged and answered. The patient understands and consents to the procedure.  Previous imaging reviewed. Patient positioned supine. Noncontrast localization CT performed.  The right subhepatic intraperitoneal mass was localized and marked for an anterolateral approach.  Under sterile conditions and local anesthesia, a 17 gauge 6.8 cm access needle was advanced to the lesion. Needle position confirmed with CT. 18 gauge core biopsies obtained. Samples placed in formalin. Needle tract occluded with Gel-Foam. Postprocedure imaging demonstrates no hemorrhage or hematoma.  Patient tolerated the procedure well without complication. Vital sign monitoring by nursing staff during the procedure will continue as patient is in the special procedures unit for post procedure observation.  FINDINGS: The images document guide needle placement within the right subhepatic intraperitoneal mass. Post biopsy  images demonstrate no hemorrhage or hematoma.  COMPLICATIONS: None immediate.  IMPRESSION: Successful CT-guided core biopsy of the right subhepatic intraperitoneal abdominal mass   Electronically Signed   By: Jerilynn Mages.  Shick M.D.   On: 06/23/2019 09:56  Assessment:    1.  Right upper quadrant mass  2. DUB (dysfunctional uterine bleeding)   3. Endometriosis   4. Essential hypertension   5. Ventral hernia without obstruction or gangrene   6.  Intramural and submucous leiomyoma of uterus     Plan:    Counseling: Procedure, risks, reasons, benefits and complications (including injury to bowel, bladder, major blood vessel, ureter, bleeding, possibility of transfusion, infection, or fistula formation) reviewed in detail. Likelihood of success in alleviating the patient's condition was discussed. Patient notes that outside of the RUQ mass, she does not desire to have any of her other uterine fibroids removed unless significantly enlarged, as her symptoms of bleeding and pain are currently managed on Orilissa.  Her endometriosis is also currently managed on Orilissa.   All questions answered. For joint surgery with General Surgery, who will proceed first.  Patient  ok to proceed to OR when ready. Has been NPO since midnight.    Rubie Maid, MD [facility]

## 2019-09-07 NOTE — Brief Op Note (Signed)
09/07/2019  5:52 PM  PATIENT:  Lynn Boyd  34 y.o. female  PRE-OPERATIVE DIAGNOSIS:  Incisional hernias, RUQ mass/fibroid  POST-OPERATIVE DIAGNOSIS:  Incisional hernias, RUQ mass/fibroid  PROCEDURE:  Procedure(s): ROBOTIC ASSISTED INCISIONAL HERNIA x5 ROBOTIC ASSISTED EXCISION OF RIGHT UPPER QUADRANT  MASS  SURGEON:  Surgeon(s) and Role: Panel 1:    * Alfonza Toft, MD - Primary    * Nestor Lewandowsky, MD - Assisting Panel 2:    * Rubie Maid, MD - Primary  ANESTHESIA:   general  EBL:  30 mL   BLOOD ADMINISTERED:none  DRAINS: none   LOCAL MEDICATIONS USED:  BUPIVICAINE   SPECIMEN:  Source of Specimen:  RUQ mass  DISPOSITION OF SPECIMEN:  PATHOLOGY  COUNTS:  YES   DICTATION: .Dragon Dictation  PLAN OF CARE: Admit to inpatient   PATIENT DISPOSITION:  PACU - hemodynamically stable.   Delay start of Pharmacological VTE agent (>24hrs) due to surgical blood loss or risk of bleeding: yes

## 2019-09-07 NOTE — Interval H&P Note (Signed)
History and Physical Interval Note:  09/07/2019 8:41 AM  Lynn Boyd  has presented today for surgery, with the diagnosis of Incisional hernias, RUQ mass/fibroid.  The various methods of treatment have been discussed with the patient and family. After consideration of risks, benefits and other options for treatment, the patient has consented to  Procedure(s) with comments: XI [place] (x 2 hernias), possible open (N/A) - 2 hernias, possible open Also robotic possible open, excision of RUQ mass/fibroid by dr. Rubie Maid XI ROBOTIC ASSISTED MYOMECTOMY, POSSIBLE OPEN (N/A) ABDOMINAL MYOMECTOMY (N/A) as a surgical intervention.  The patient's history has been reviewed, patient examined, no change in status, stable for surgery.  I have reviewed the patient's chart and labs.  Questions were answered to the patient's satisfaction.     [name]

## 2019-09-07 NOTE — Op Note (Addendum)
Procedure(s): ROBOTIC ASSISTED INCISIONAL HERNIA x5 ROBOTIC ASSISTED EXCISION OF RIGHT UPPER QUADRANT  MASS Procedure Note  Lynn Boyd female 34 y.o. 09/07/2019  Indications: The patient is a 34 y.o. G0P0000 female with a history of ventral (incisional) hernias, endometriosis, fibroid uterus, abdominal and pelvic adhesions, with right upper quadrant mass (suspicious for fibroid).  Pre-operative Diagnosis: history of ventral (incisional) hernias, endometriosis, fibroid uterus, abdominal and pelvic adhesions, with right upper quadrant mass (suspicious for fibroid), obesity, HTN.   Post-operative Diagnosis: Same  Surgeon: Olean Ree, MD (General Surgeon)  Assistants:  Nestor Lewandowsky, MD (General Surgery) and Rubie Maid, MD (OB/GYN). An experienced assistant was required given the standard of surgical care given the complexity of the case.  This assistant was needed for exposure, dissection, suctioning, retraction, instrument exchange, and for overall help during the procedure.  Anesthesia: General endotracheal anesthesia  Findings: Large RUQ mass encased in omental adhesions. Had the appearance of a large fibroid with abnormal vasculature, ~ 15 cm.  Pelvis not visualized during procedure. Please see Dr. Mont Dutton operative note for further details of findings.   Procedure Details: The patient was seen in the Holding Room. The risks, benefits, complications, treatment options, and expected outcomes were discussed with the patient.  The patient concurred with the proposed plan, giving informed consent.  The site of surgery properly noted/marked. The patient was taken to the Operating Room, identified as Lynn Boyd and the procedure verified as Procedure(s): ROBOTIC ASSISTED INCISIONAL HERNIA x5, ROBOTIC ASSISTED EXCISION OF RIGHT UPPER QUADRANT  MASS. A Time Out was held and the above information confirmed.  She was then placed under general anesthesia without difficulty. She was  placed in the dorsal lithotomy position, and was prepped and draped in a sterile manner.  Please refer to Dr. Mont Dutton operative note for details of robotic port placement, lysis of right upper quadrant adhesions, and ventral hernia repairs (x 5).  I was called into the procedure after the robot had been undocked in order to remove the mass.   An X-Small Alexis retractor was placed in the LUQ incision site after the incision was extended to approximately 6 cm in length.  The mass had been placed in an Endocatch bag, and was brought up to the incision site and opened. The mass was grasped with Ardis Hughs tenaculum and was morecelated using sharp instruments (Mayo scissors and knife) until it was able to be delivered from the incision site. Dr. Hampton Abbot assisted with grasping of the mass and coring of the specimen. After the specimen was removed and sent to Pathology for evaluation, the Alexis retractor was removed from the abdomen.    Approximately 45 minutes of operating time was spent performing morcellation of the mass for extraction.   Please refer again to Dr. Mont Dutton operative note for closure of the incision port sites.    Estimated Blood Loss:  30 ml      Drains: Foley catheter to gravity, 675 ml at end of the procedure.          Total IV Fluids:  2000 ml  Specimens: RUQ mass (fibroid)         Implants: None         Complications:  None; patient tolerated the procedure well.         Disposition: PACU - hemodynamically stable.         Condition: stable   Rubie Maid, MD [facility]

## 2019-09-07 NOTE — Anesthesia Preprocedure Evaluation (Addendum)
Anesthesia Evaluation   Patient awake    Reviewed: Allergy & Precautions, H&P , NPO status , reviewed documented beta blocker date and time   Airway Mallampati: III  TM Distance: >3 FB Neck ROM: full    Dental  (+) Chipped   Pulmonary sleep apnea ,    Pulmonary exam normal        Cardiovascular hypertension, Normal cardiovascular exam+ Valvular Problems/Murmurs   07/2018 ECHO   Left ventricular hypertrophy - moderate   Normal left ventricular systolic function, ejection fraction > 55%   Dilated left atrium - moderate   Normal right ventricular systolic function   No significant valvular abnormalities   No significant dynamic outflow gradient    Neuro/Psych PSYCHIATRIC DISORDERS Anxiety Depression  Neuromuscular disease    GI/Hepatic neg GERD  ,  Endo/Other    Renal/GU      Musculoskeletal   Abdominal   Peds  Hematology  (+) Blood dyscrasia, anemia ,   Anesthesia Other Findings Past Medical History: No date: Anxiety No date: Cardiomyopathy [facility])     Comment:  HYPERTROPHIC, previous GOT, phenylephrine used during case No date: Chronic blood loss anemia     Comment:  iron infusions No date: Depression No date: Endometriosis No date: Enlarged heart No date: Fibroid uterus No date: Heart murmur No date: Herpes simplex virus (HSV) infection 2016: History of blood transfusion     Comment:  2 units, given for chronic blood loss anemia  No date: Hx MRSA infection     Comment:  wound that required a drain No date: Hypertension No date: Sleep apnea     Comment:  OSA, not currently using CPAP 11/28/2014: TOA (tubo-ovarian abscess) 2017: Vaginal Pap smear, abnormal     Comment:  ASCUS with HPV+ Past Surgical History: No date: endometreoisis     Comment:  surgery 2016: IR IMAGE GUIDED DRAINAGE BY PERCUTANEOUS CATHETER     Comment:  left tubo-ovarian abscess, performed at [facility] 05/2010: [place]:  [facility] 08/24/2018: ROBOT ASSISTED MYOMECTOMY; N/A     Comment:  Procedure: ROBOTIC ASSISTED MYOMECTOMY;  Surgeon:               Rubie Maid, MD;  Location: [facility] ORS;  Service:               Gynecology;  Laterality: N/A; 08/24/2018: ROBOTIC ASSISTED LAPAROSCOPIC LYSIS OF ADHESION; N/A     Comment:  Procedure: ROBOTIC ASSISTED LAPAROSCOPIC LYSIS OF               ADHESION;  Surgeon: Rubie Maid, MD;  Location: [facility]               ORS;  Service: Gynecology;  Laterality: N/A; BMI    Body Mass Index: 38.35 kg/m     Reproductive/Obstetrics                            Anesthesia Physical Anesthesia Plan  ASA: III  Anesthesia Plan: General   Post-op Pain Management:    Induction: Intravenous  PONV Risk Score and Plan: Ondansetron and Treatment may vary due to age or medical condition  Airway Management Planned: Oral ETT  Additional Equipment:   Intra-op Plan:   Post-operative Plan: Extubation in OR  Informed Consent: I have reviewed the patients History and Physical, chart, labs and discussed the procedure including the risks, benefits and alternatives for the proposed anesthesia with the  patient or authorized representative who has indicated his/her understanding and acceptance.     Dental Advisory Given  Plan Discussed with: CRNA  Anesthesia Plan Comments:         Anesthesia Quick Evaluation

## 2019-09-07 NOTE — Transfer of Care (Signed)
Immediate Anesthesia Transfer of Care Note  Patient: Lynn Boyd  Procedure(s) Performed: ROBOTIC ASSISTED INCISIONAL HERNIA x5 ROBOTIC ASSISTED EXCISION OF RIGHT UPPER QUADRANT  MASS  Patient Location: PACU  Anesthesia Type:General  Level of Consciousness: drowsy  Airway & Oxygen Therapy: Patient Spontanous Breathing and Patient connected to face mask oxygen  Post-op Assessment: Report given to RN and Post -op Vital signs reviewed and stable  Post vital signs: Reviewed and stable  Last Vitals:  Vitals Value Taken Time  BP 113/67 09/07/19 1745  Temp 36.4 C 09/07/19 1745  Pulse 83 09/07/19 1748  Resp 21 09/07/19 1748  SpO2 100 % 09/07/19 1748  Vitals shown include unvalidated device data.  Last Pain:  Vitals:   09/07/19 0751  TempSrc: Tympanic  PainSc: 0-No pain         Complications: No complications documented.

## 2019-09-08 DIAGNOSIS — Z6838 Body mass index (BMI) 38.0-38.9, adult: Secondary | ICD-10-CM | POA: Diagnosis not present

## 2019-09-08 DIAGNOSIS — D250 Submucous leiomyoma of uterus: Secondary | ICD-10-CM | POA: Diagnosis present

## 2019-09-08 DIAGNOSIS — N7011 Chronic salpingitis: Secondary | ICD-10-CM | POA: Diagnosis present

## 2019-09-08 DIAGNOSIS — K432 Incisional hernia without obstruction or gangrene: Secondary | ICD-10-CM | POA: Diagnosis present

## 2019-09-08 DIAGNOSIS — R1900 Intra-abdominal and pelvic swelling, mass and lump, unspecified site: Secondary | ICD-10-CM | POA: Diagnosis not present

## 2019-09-08 DIAGNOSIS — G4733 Obstructive sleep apnea (adult) (pediatric): Secondary | ICD-10-CM | POA: Diagnosis present

## 2019-09-08 DIAGNOSIS — N938 Other specified abnormal uterine and vaginal bleeding: Secondary | ICD-10-CM | POA: Diagnosis present

## 2019-09-08 DIAGNOSIS — L910 Hypertrophic scar: Secondary | ICD-10-CM | POA: Diagnosis present

## 2019-09-08 DIAGNOSIS — Z881 Allergy status to other antibiotic agents status: Secondary | ICD-10-CM | POA: Diagnosis not present

## 2019-09-08 DIAGNOSIS — Z79899 Other long term (current) drug therapy: Secondary | ICD-10-CM | POA: Diagnosis not present

## 2019-09-08 DIAGNOSIS — N803 Endometriosis of pelvic peritoneum: Secondary | ICD-10-CM | POA: Diagnosis present

## 2019-09-08 DIAGNOSIS — Z8614 Personal history of Methicillin resistant Staphylococcus aureus infection: Secondary | ICD-10-CM | POA: Diagnosis not present

## 2019-09-08 DIAGNOSIS — I10 Essential (primary) hypertension: Secondary | ICD-10-CM | POA: Diagnosis present

## 2019-09-08 DIAGNOSIS — Z91040 Latex allergy status: Secondary | ICD-10-CM | POA: Diagnosis not present

## 2019-09-08 DIAGNOSIS — I422 Other hypertrophic cardiomyopathy: Secondary | ICD-10-CM | POA: Diagnosis present

## 2019-09-08 DIAGNOSIS — E669 Obesity, unspecified: Secondary | ICD-10-CM | POA: Diagnosis present

## 2019-09-08 DIAGNOSIS — D251 Intramural leiomyoma of uterus: Secondary | ICD-10-CM | POA: Diagnosis present

## 2019-09-08 DIAGNOSIS — Z20822 Contact with and (suspected) exposure to covid-19: Secondary | ICD-10-CM | POA: Diagnosis present

## 2019-09-08 LAB — HIV ANTIBODY (ROUTINE TESTING W REFLEX): HIV Screen 4th Generation wRfx: NONREACTIVE

## 2019-09-08 LAB — BASIC METABOLIC PANEL
Anion gap: 11 (ref 5–15)
BUN: 13 mg/dL (ref 6–20)
CO2: 25 mmol/L (ref 22–32)
Calcium: 8.7 mg/dL — ABNORMAL LOW (ref 8.9–10.3)
Chloride: 102 mmol/L (ref 98–111)
Creatinine, Ser: 1.01 mg/dL — ABNORMAL HIGH (ref 0.44–1.00)
GFR calc Af Amer: >60 mL/min (ref >60)
GFR calc non Af Amer: >60 mL/min (ref >60)
Glucose, Bld: 121 mg/dL — ABNORMAL HIGH (ref 70–99)
Potassium: 4.1 mmol/L (ref 3.5–5.1)
Sodium: 138 mmol/L (ref 135–145)

## 2019-09-08 LAB — CBC
HCT: 36.8 % (ref 36.0–46.0)
Hemoglobin: 12 g/dL (ref 12.0–15.0)
MCH: 27.6 pg (ref 26.0–34.0)
MCHC: 32.6 g/dL (ref 30.0–36.0)
MCV: 84.6 fL (ref 80.0–100.0)
Platelets: 168 10*3/uL (ref 150–400)
RBC: 4.35 MIL/uL (ref 3.87–5.11)
RDW: 14.6 % (ref 11.5–15.5)
WBC: 7.7 10*3/uL (ref 4.0–10.5)
nRBC: 0 % (ref 0.0–0.2)

## 2019-09-08 LAB — MAGNESIUM: Magnesium: 1.6 mg/dL — ABNORMAL LOW (ref 1.7–2.4)

## 2019-09-08 MED ORDER — OXYCODONE HCL 5 MG PO TABS: 5.0000 mg | ORAL_TABLET | Freq: Four times a day (QID) | ORAL | 0 refills | Status: DC | PRN

## 2019-09-08 MED ORDER — IBUPROFEN 800 MG PO TABS
800.0000 mg | ORAL_TABLET | Freq: Three times a day (TID) | ORAL | 0 refills | Status: DC | PRN
Start: 2019-09-08 — End: 2020-01-26

## 2019-09-08 MED ORDER — CHLORHEXIDINE GLUCONATE CLOTH 2 % EX PADS
6.0000 | MEDICATED_PAD | Freq: Every day | CUTANEOUS | Status: DC
  Administered 2019-09-08: 6 via TOPICAL

## 2019-09-08 MED ORDER — MAGNESIUM SULFATE 2 GM/50ML IV SOLN
2.0000 g | Freq: Once | INTRAVENOUS | Status: AC
  Administered 2019-09-08: 2 g via INTRAVENOUS
  Filled 2019-09-08: qty 50

## 2019-09-08 NOTE — Progress Notes (Signed)
Ladson visited pt. per OR for AD education/completion; pt. in bed watching TV with covers pulled up; mother at bedside.  Vandiver described AD to pt. --> Pt says she is interested in completing document.  Pt. independently filled out most of document and then asked if it would be OK for her to take it home to reread when she felt more rested.  Heflin said this is completely fine.  No further needs at this time.    09/08/19 1400  Clinical Encounter Type  Visited With Patient and family together  Visit Type Initial;Psychological support;Social support  Referral From Nurse  Spiritual Encounters  Spiritual Needs  (Advanced Care planning)

## 2019-09-08 NOTE — Progress Notes (Signed)
Patient ambulated to the BR and voided in the toilet. Patient is leaving the unit for discharge home.

## 2019-09-08 NOTE — Discharge Summary (Signed)
[facility] SURGICAL DISCHARGE SUMMARY  Patient ID: Quorra Rosene MRN: [id number] DOB/AGE: 36-May-1987 36 y.o.  Admit date: 09/07/2019 Discharge date: 09/08/2019  Discharge Diagnoses Patient Active Problem List   Diagnosis Date Noted   Incisional hernia without obstruction or gangrene 09/07/2019   Intraabdominal mass    Ventral hernia without obstruction or gangrene 08/06/2018   Fibroid uterus 08/08/2017   Endometriosis of pelvic peritoneum 06/04/2011   Consultants OB/GYN - Dr Marcelline Mates  Procedures 09/07/2019:  1.  Robotic assisted incisional hernia repair x5 with mesh x2 2.  Robotic assisted excision of right upper quadrant mass.  Dr Trellis Moment, MD & Dr Rubie Maid, MD   HPI: This is a 34 y.o. female who presents with two incisional hernias identified on recent CT scan, as well as an enlarging right upper quadrant mass, suspicious for fibroid based on biopsy.  The options of surgery versus observation were reviewed with the patient and/or family. The risks of bleeding, abscess or infection, recurrence of symptoms, potential for an open procedure, injury to surrounding structures, and chronic pain were all discussed with the patient and was willing to proceed.  Hospital Course: Informed consent was obtained and documented, and patient underwent uneventful robotic assisted laparoscopic excision of right upper quadrant mass and repair of incisional hernia x5 (Dr Trellis Moment, MD & Dr Rubie Maid, MD, 09/07/2019).  Post-operatively, patient's pain improved/resolved and advancement of patient's diet and ambulation were well-tolerated. The remainder of patient's hospital course was essentially unremarkable, and discharge planning was initiated accordingly with patient safely able to be discharged home with appropriate discharge instructions, pain control, and outpatient follow-up after all of her and her family's questions were answered to their expressed  satisfaction.   Discharge Condition: Good   Physical Examination:  Constitutional: Well appearing female, NAD Pulmonary: Normal effort, no respiratory distress Gastrointestinal: Soft, incisional soreness, non-distended, no rebound/guarding Skin: Laparoscopic incisions are CDI with dermabond, no erythema or drainage    Allergies as of 09/08/2019      Reactions   Bactrim [sulfamethoxazole-trimethoprim] Swelling   Latex Rash      Medication List    TAKE these medications   acetaminophen 325 MG tablet Commonly known as: TYLENOL Take 650 mg by mouth every 6 (six) hours as needed.   adapalene 0.1 % gel Commonly known as: DIFFERIN Apply 1 application topically every other day.   cholecalciferol 25 MCG (1000 UNIT) tablet Commonly known as: VITAMIN D3 Take 1,000 Units by mouth daily.   clindamycin 1 % gel Commonly known as: CLINDAGEL Apply 1 application topically daily.   ibuprofen 800 MG tablet Commonly known as: ADVIL Take 1 tablet (800 mg total) by mouth every 8 (eight) hours as needed.   metoprolol succinate 50 MG 24 hr tablet Commonly known as: TOPROL-XL Take 50 mg by mouth daily.   naproxen sodium 220 MG tablet Commonly known as: ALEVE Take 220 mg by mouth.   Orilissa 200 MG Tabs Generic drug: Elagolix Sodium Take 1 tablet by mouth in the morning and at bedtime.   oxyCODONE 5 MG immediate release tablet Commonly known as: Oxy IR/ROXICODONE Take 1 tablet (5 mg total) by mouth every 6 (six) hours as needed for severe pain or breakthrough pain.   spironolactone 50 MG tablet Commonly known as: ALDACTONE Take 100 mg by mouth daily.   tretinoin 0.025 % cream Commonly known as: RETIN-A Apply 1 application topically at bedtime.         Follow-up Information    Piscoya, [place],  MD. Schedule an appointment as soon as possible for a visit in 2 week(s).   Specialty: General Surgery Why: s/p robotic ventral hernia repair and excision of RUQ mass Contact  information: [address] [place] [address] [phone]                Time spent on discharge management including discussion of hospital course, clinical condition, outpatient instructions, prescriptions, and follow up with the patient and members of the medical team: >30 minutes  -- Edison Simon , PA-C [facility]  09/08/2019, 9:23 AM [phone] M-F: 7am - 4pm'

## 2019-09-08 NOTE — Progress Notes (Signed)
Post-Operative Day # 1, s/p robotic laparoscopic hernia repair and excision of RUQ mass  Subjective: no complaints, tolerating PO, and + flatus.  Has not yet ambulated. Notes pain is pretty well controlled.   Objective: Temp:  [97.4 F (36.3 C)-98.6 F (37 C)] 98.6 F (37 C) (07/02 0723) Pulse Rate:  [84-99] 87 (07/02 0723) Resp:  [18-20] 18 (07/02 0723) BP: (113-134)/(67-93) 122/78 (07/02 0723) SpO2:  [94 %-100 %] 96 % (07/02 0723) Weight:  [93.8 kg-94.5 kg] 93.8 kg (07/02 0515)  Physical Exam:  General: cooperative and no distress  Lungs: clear to auscultation bilaterally Heart: regular rate and rhythm, S1, S2 normal, no murmur, click, rub or gallop Abdomen: soft, non-tender; bowel sounds normal; no masses,  no organomegaly.  Incision: healing well, no significant drainage, no dehiscence, no significant erythema Pelvis:deferred.  Extremities: DVT Evaluation: No evidence of DVT seen on physical exam. Negative Homan's sign. No cords or calf tenderness. No significant calf/ankle edema.    Recent Labs    09/08/19 0527  HGB 12.0  HCT 36.8     Lab Results  Component Value Date   CREATININE 1.01 (H) 09/08/2019     Assessment/Plan: Doing well postoperatively. Defer post-operative care to primary team (General Surgery).  Briefly discussed intra-operative findings.  Patient given samples of Orilissa from the office as she notes issues with her most recent prescription at the pharmacy.  To inform office whether she will require more samples in the near future.      Rubie Maid, MD [facility]

## 2019-09-08 NOTE — Progress Notes (Signed)
Discharge instructions reviewed wiith patient. Opportunity for questions made available. Patient verbalized understanding of discharge instructions. IV removed from patient per protocol.

## 2019-09-08 NOTE — Discharge Instructions (Signed)
In addition to included general post-operative instructions,  Diet: Resume home heart healthy  diet.   Activity: No heavy lifting >20 pounds (children, pets, laundry, garbage) or 4-6 weeks, but light activity and walking are encouraged. Do not drive or drink alcohol if taking narcotic pain medications or having pain that might distract from driving.  Wound care: 2 days after surgery (07/03), you may shower/get incision wet with soapy water and pat dry (do not rub incisions), but no baths or submerging incision underwater until follow-up.   Medications: Resume all home medications. For mild to moderate pain: acetaminophen (Tylenol) or ibuprofen/naproxen (if no kidney disease). Combining Tylenol with alcohol can substantially increase your risk of causing liver disease. Narcotic pain medications, if prescribed, can be used for severe pain, though may cause nausea, constipation, and drowsiness. Do not combine Tylenol and Percocet (or similar) within a 6 hour period as Percocet (and similar) contain(s) Tylenol. If you do not need the narcotic pain medication, you do not need to fill the prescription.  Call office [phone] / [phone]) at any time if any questions, worsening pain, fevers/chills, bleeding, drainage from incision site, or other concerns.

## 2019-09-08 NOTE — Op Note (Signed)
Procedure Date:  09/08/2019  Pre-operative Diagnosis:  Incisional hernias, right upper quadrant mass  Post-operative Diagnosis:  Incisional hernias, right upper quadrant mass.  Procedure: 1.  Robotic assisted incisional hernia repair x5 with mesh x2 2.  Robotic assisted excision of right upper quadrant mass.  Surgeon:  Melvyn Neth, MD  Assistants:  Dr. Rubie Maid.  Her assistance was needed for the right upper quadrant mass excision component, morcellation of the mass in order to excise it, exposure, and closure.  Also assisting, Dr. Nestor Lewandowsky and Sherrill Raring, PA-S for the initial portion of the procedure with exposure, entry to abdomen.  Anesthesia:  General endotracheal  Estimated Blood Loss:  30 ml  Specimens:  Right upper quadrant mass, morcellated.  Complications:  None  Indications for Procedure:  This is a 34 y.o. female who presents with two incisional hernias identified on recent CT scan, as well as an enlarging right upper quadrant mass, suspicious for fibroid based on biopsy.  The options of surgery versus observation were reviewed with the patient and/or family. The risks of bleeding, abscess or infection, recurrence of symptoms, potential for an open procedure, injury to surrounding structures, and chronic pain were all discussed with the patient and was willing to proceed.  Description of Procedure: The patient was correctly identified in the preoperative area and brought into the operating room.  The patient was placed supine with VTE prophylaxis in place.  Appropriate time-outs were performed.  Anesthesia was induced and the patient was intubated.  Appropriate antibiotics were infused.  The patient was then placed in low lithotomy position.  The abdomen was prepped and draped in a sterile fashion.  A Veress needle was introduced in the left upper quadrant and pneumoperitoneum was obtained with appropriate pressures.  An 8 mm robotic trocar was placed in the left  lateral position using optiview technique.   Following that, an 8-mm and one 12-mm port were placed along the left lower quadrant and left upper quadrant, respectively, without complications and under direct visualization.  The DaVinci platform was then docked and instruments were placed under direct visualization.    Initial inspection revealed omentum adhered to the anterior abdominal wall at the hernia defects.  We proceeded with careful dissection and reduction of the hernias.  After this was completed, a total of 5 incisional hernias were found.  Four of them were lined up at the midline for a total length of about 8 cm.  The other one was in the right lower quadrant, measuring about 2.5 cm.  As part of the dissection process, the falciform ligament was taken down to expose the total length of the midline hernias better for closure and mesh placement.  We then turned our attention to the right upper quadrant mass.  This measured about 15 cm, and had omentum fully encapsulating the mass with adhesions extending to the right lateral abdominal wall and the liver.  The adhesions and the omentum was dissected off the mass carefully using the Vessel seal until the mass was fully free and mobilized.  Of note, there was no attachment of the mass to any other intra-abdominal structures, particularly no attachments to the retroperitoneum, to bowel, or uterus.  The 12 mm port was then upsized to a 15 mm port so that a large endocatch bag could be placed.  The mass was then placed into the bag and secured.  The DaVinci platform was then undocked, instruments removed, and all ports were removed.    At  this point, Dr. Marcelline Mates came in to assist with the removal of the large right upper quadrant mass.  The LUQ incision was extended to 6 cm to allow for better exposure of the endocatch bag.  Please see her operative note for details.  After the mass was removed, the anterior fascia in the LUQ incision was closed using 0  Vicryl figure eight sutures.  50 ml of Exparel solution mixed with 0.5% bupivacaine with epi was infiltrated onto the wounds.  The wounds were then irrigated and closed in two layers using 3-0 Vicryl and 4-0 Monocryl.  The wounds were cleaned and sealed with DermaBond.  The patient was emerged from anesthesia and extubated and brought to the recovery room for further management.  The patient tolerated the procedure well and all counts were correct at the end of the case.   Melvyn Neth, MD

## 2019-09-08 NOTE — Anesthesia Postprocedure Evaluation (Signed)
Anesthesia Post Note  Patient: [name]  Procedure(s) Performed: ROBOTIC ASSISTED INCISIONAL HERNIA x5 ROBOTIC ASSISTED EXCISION OF RIGHT UPPER QUADRANT  MASS  Patient location during evaluation: PACU Anesthesia Type: General Level of consciousness: awake and alert Pain management: pain level controlled Vital Signs Assessment: post-procedure vital signs reviewed and stable Respiratory status: spontaneous breathing, nonlabored ventilation, respiratory function stable and patient connected to nasal cannula oxygen Cardiovascular status: blood pressure returned to baseline and stable Postop Assessment: no apparent nausea or vomiting Anesthetic complications: no   No complications documented.   Last Vitals:  Vitals:   09/07/19 1857 09/07/19 1912  BP: 123/76 134/80  Pulse: 90 92  Resp: 20 19  Temp:  (!) 36.3 C  SpO2: 95% 96%    Last Pain:  Vitals:   09/07/19 2236  TempSrc:   PainSc: 4                  Martha Clan

## 2019-09-08 NOTE — Progress Notes (Signed)
Post foley cath removal bladder scan completed. Bladder scan recorded 30 ml. Will continue to monitor.

## 2019-09-13 LAB — SURGICAL PATHOLOGY

## 2019-09-13 NOTE — Progress Notes (Signed)
All looks to be benign which is great, thanks!  Dr. Marcelline Mates

## 2019-09-15 ENCOUNTER — Encounter: Payer: Self-pay | Admitting: Surgery

## 2019-09-20 ENCOUNTER — Telehealth: Payer: Self-pay | Admitting: Surgery

## 2019-09-20 ENCOUNTER — Encounter: Payer: Self-pay | Admitting: Surgery

## 2019-09-20 ENCOUNTER — Other Ambulatory Visit: Payer: Self-pay

## 2019-09-20 ENCOUNTER — Ambulatory Visit (INDEPENDENT_AMBULATORY_CARE_PROVIDER_SITE_OTHER): Payer: Self-pay | Admitting: Surgery

## 2019-09-20 VITALS — BP 152/110 | HR 82 | Temp 98.8°F | Resp 14 | Ht 62.0 in | Wt 214.0 lb

## 2019-09-20 DIAGNOSIS — Z09 Encounter for follow-up examination after completed treatment for conditions other than malignant neoplasm: Secondary | ICD-10-CM

## 2019-09-20 DIAGNOSIS — K439 Ventral hernia without obstruction or gangrene: Secondary | ICD-10-CM

## 2019-09-20 DIAGNOSIS — R1900 Intra-abdominal and pelvic swelling, mass and lump, unspecified site: Secondary | ICD-10-CM

## 2019-09-20 MED ORDER — CYCLOBENZAPRINE HCL 5 MG PO TABS: 5.0000 mg | ORAL_TABLET | Freq: Three times a day (TID) | ORAL | 0 refills | Status: DC | PRN

## 2019-09-20 NOTE — Telephone Encounter (Signed)
Patient is calling said she is hurting where the incision site is, said the incision site is fine but is asking maybe if she could be seen sooner. Please call patient and advise.

## 2019-09-20 NOTE — Patient Instructions (Signed)
You may try ice packs more frequently to the area. Please pick up your medication at the pharmacy.  Please call our office if you do not have any improvement by Friday.   Muscle Cramps and Spasms Muscle cramps and spasms are when muscles tighten by themselves. They usually get better within minutes. Muscle cramps are painful. They are usually stronger and last longer than muscle spasms. Muscle spasms may or may not be painful. They can last a few seconds or much longer. Cramps and spasms can affect any muscle, but they occur most often in the calf muscles of the leg. They are usually not caused by a serious problem. In many cases, the cause is not known. Some common causes include:  Doing more physical work or exercise than your body is ready for.  Using the muscles too much (overuse) by repeating certain movements too many times.  Staying in a certain position for a long time.  Playing a sport or doing an activity without preparing properly.  Using bad form or technique while playing a sport or doing an activity.  Not having enough water in your body (dehydration).  Injury.  Side effects of some medicines.  Low levels of the salts and minerals in your blood (electrolytes), such as low potassium or calcium. Follow these instructions at home: Managing pain and stiffness      Massage, stretch, and relax the muscle. Do this for many minutes at a time.  If told, put heat on tight or tense muscles as often as told by your doctor. Use the heat source that your doctor recommends, such as a moist heat pack or a heating pad. ? Place a towel between your skin and the heat source. ? Leave the heat on for 20-30 minutes. ? Remove the heat if your skin turns bright red. This is very important if you are not able to feel pain, heat, or cold. You may have a greater risk of getting burned.  If told, put ice on the affected area. This may help if you are sore or have pain after a cramp or  spasm. ? Put ice in a plastic bag. ? Place a towel between your skin and the bag. ? Leave the ice on for 20 minutes, 2-3 times a day.  Try taking hot showers or baths to help relax tight muscles. Eating and drinking  Drink enough fluid to keep your pee (urine) pale yellow.  Eat a healthy diet to help ensure that your muscles work well. This should include: ? Fruits and vegetables. ? Lean protein. ? Whole grains. ? Low-fat or nonfat dairy products. General instructions  If you are having cramps often, avoid intense exercise for several days.  Take over-the-counter and prescription medicines only as told by your doctor.  Watch for any changes in your symptoms.  Keep all follow-up visits as told by your doctor. This is important. Contact a doctor if:  Your cramps or spasms get worse or happen more often.  Your cramps or spasms do not get better with time. Summary  Muscle cramps and spasms are when muscles tighten by themselves. They usually get better within minutes.  Cramps and spasms occur most often in the calf muscles of the leg.  Massage, stretch, and relax the muscle. This may help the cramp or spasm go away.  Drink enough fluid to keep your pee (urine) pale yellow. This information is not intended to replace advice given to you by your health care provider. Make  sure you discuss any questions you have with your health care provider. Document Revised: 07/19/2017 Document Reviewed: 07/19/2017 Elsevier Patient Education  [employer].

## 2019-09-20 NOTE — Progress Notes (Signed)
09/20/2019  HPI: Lynn Boyd is a 34 y.o. female s/p robotic incisional hernia repair x 2 and excision of RUQ mass with Dr. Marcelline Mates.  Patient presents for follow up, which she moved ahead of time because she started having some muscle spasms around the LUQ incision yesterday.  She's also started her menstrual cycle, but she reports that she's never had those muscle spasms before with her cycle.  Denies any fevers, chills, chest pain, shortness of breath.  Reports the incisions at first were painful but this has improved since.  No issues with the incisions themselves.  Vital signs: BP (!) 152/110    Pulse 82    Temp 98.8 F (37.1 C) (Oral)    Resp 14    Ht 5\' 2"  (1.575 m)    Wt 214 lb (97.1 kg)    SpO2 96%    BMI 39.14 kg/m    Physical Exam: Constitutional: No acute distress Abdomen:  Soft, non-distended, with tenderness to palpation around the LUQ incision, radiating posteriorly.  The three incisions are healing well, clean, dry, intact, without any evidence of new incisional hernia or recurrent hernia at the prior sites.  Assessment/Plan: This is a 34 y.o. female s/p robotic incisional hernia repair x 2 and excision of RUQ mass.  --Discussed pathology results with the patient.  There was no evidence of malignancy or atypia.  Results consistent with leiomyoma. --Discussed with the patient that the spasms could be as a result of irritation to the nerves around the LUQ incision, which is the largest of the three incisions.  Will prescribe Flexeril today to see if this helps.  She will call us in two days if there's no improvement so we can change the prescription.  Otherwise, we will see her again in two weeks for follow up.   Melvyn Neth, [facility]

## 2019-09-20 NOTE — Telephone Encounter (Signed)
Per Dr.Piscoya OK for patient to come in today at 4:30p to be seen for pain at her incision site, but denies the incision sites having any issues. Patient verbalized understanding and has no further questions.

## 2019-09-22 ENCOUNTER — Encounter: Payer: BC Managed Care – PPO | Admitting: Surgery

## 2019-10-04 ENCOUNTER — Telehealth: Payer: Self-pay

## 2019-10-04 ENCOUNTER — Encounter: Payer: Self-pay | Admitting: Surgery

## 2019-10-04 ENCOUNTER — Ambulatory Visit (INDEPENDENT_AMBULATORY_CARE_PROVIDER_SITE_OTHER): Payer: Self-pay | Admitting: Surgery

## 2019-10-04 ENCOUNTER — Other Ambulatory Visit: Payer: Self-pay

## 2019-10-04 VITALS — BP 147/94 | HR 69 | Temp 98.6°F | Ht 62.0 in | Wt 212.0 lb

## 2019-10-04 DIAGNOSIS — Z09 Encounter for follow-up examination after completed treatment for conditions other than malignant neoplasm: Secondary | ICD-10-CM

## 2019-10-04 NOTE — Telephone Encounter (Signed)
Per Dr.Piscoya -need to move patient from 2:15 appointment to later time today or move to a different day- Dr.Piscoya has a surgery that will begin soon in the OR. Will not be in clinic until 3 or after. Patient would like to come around 3.

## 2019-10-04 NOTE — Patient Instructions (Addendum)
Follow up here in 3 months.    GENERAL POST-OPERATIVE PATIENT INSTRUCTIONS   WOUND CARE INSTRUCTIONS:  Keep a dry clean dressing on the wound if there is drainage. The initial bandage may be removed after 24 hours.  Once the wound has quit draining you may leave it open to air.  If clothing rubs against the wound or causes irritation and the wound is not draining you may cover it with a dry dressing during the daytime.  Try to keep the wound dry and avoid ointments on the wound unless directed to do so.  If the wound becomes bright red and painful or starts to drain infected material that is not clear, please contact your physician immediately.  If the wound is mildly pink and has a thick firm ridge underneath it, this is normal, and is referred to as a healing ridge.  This will resolve over the next 4-6 weeks.  BATHING: You may shower if you have been informed of this by your surgeon. However, Please do not submerge in a tub, hot tub, or pool until incisions are completely sealed or have been told by your surgeon that you may do so.  DIET:  You may eat any foods that you can tolerate.  It is a good idea to eat a high fiber diet and take in plenty of fluids to prevent constipation.  If you do become constipated you may want to take a mild laxative or take ducolax tablets on a daily basis until your bowel habits are regular.  Constipation can be very uncomfortable, along with straining, after recent surgery.  ACTIVITY:  You are encouraged to cough and deep breath or use your incentive spirometer if you were given one, every 15-30 minutes when awake.  This will help prevent respiratory complications and low grade fevers post-operatively if you had a general anesthetic.  You may want to hug a pillow when coughing and sneezing to add additional support to the surgical area, if you had abdominal or chest surgery, which will decrease pain during these times.  You are encouraged to walk and engage in light  activity for the next two weeks.  You should not lift, push, or pull more than 15-20 pounds for 6 weeks after surgery as it could put you at increased risk for complications.  Twenty pounds is roughly equivalent to a plastic bag of groceries. At that time- Listen to your body when lifting, if you have pain when lifting, stop and then try again in a few days. Soreness after doing exercises or activities of daily living is normal as you get back in to your normal routine.  MEDICATIONS:  Try to take narcotic medications and anti-inflammatory medications, such as tylenol, ibuprofen, naprosyn, etc., with food.  This will minimize stomach upset from the medication.  Should you develop nausea and vomiting from the pain medication, or develop a rash, please discontinue the medication and contact your physician.  You should not drive, make important decisions, or operate machinery when taking narcotic pain medication.  SUNBLOCK Use sun block to incision area over the next year if this area will be exposed to sun. This helps decrease scarring and will allow you avoid a permanent darkened area over your incision.  QUESTIONS:  Please feel free to call our office if you have any questions, and we will be glad to assist you.

## 2019-10-06 NOTE — Progress Notes (Signed)
   Lynn Boyd is a 34 y.o. female s/p robotic incisional hernia repair  and excision of RUQ mass with Dr. Marcelline Mates and Dr. Hampton Abbot 09/07/19.  Patient presents for follow up, which she moved ahead of time because she started having some muscle spasms around the LUQ incision yesterday.   Denies any fevers, chills, chest pain, shortness of breath. Significant improvement.  No issues with the incisions themselves.  PE NAD Abd: soft, incisions c/d/i, no infection or recurrence  A/p Doing well She wishes to f/u w Dr. Hampton Abbot in 3 months

## 2019-12-21 ENCOUNTER — Other Ambulatory Visit: Payer: Self-pay | Admitting: Obstetrics and Gynecology

## 2020-01-05 ENCOUNTER — Ambulatory Visit: Payer: BC Managed Care – PPO | Admitting: Surgery

## 2020-01-12 ENCOUNTER — Ambulatory Visit (INDEPENDENT_AMBULATORY_CARE_PROVIDER_SITE_OTHER): Payer: BC Managed Care – PPO | Admitting: Surgery

## 2020-01-12 ENCOUNTER — Encounter: Payer: Self-pay | Admitting: Surgery

## 2020-01-12 ENCOUNTER — Other Ambulatory Visit: Payer: Self-pay

## 2020-01-12 VITALS — BP 126/86 | HR 98 | Temp 100.3°F | Ht 62.0 in | Wt 224.6 lb

## 2020-01-12 DIAGNOSIS — L910 Hypertrophic scar: Secondary | ICD-10-CM | POA: Diagnosis not present

## 2020-01-12 NOTE — Patient Instructions (Addendum)
Dr.Piscoya discussed with patient to try and increase her exercise activity. Patient will follow up with our office on November 19th 2021 at 11:00 am for In Office Procedure.   Exercising to Lose Weight Exercise is structured, repetitive physical activity to improve fitness and health. Getting regular exercise is important for everyone. It is especially important if you are overweight. Being overweight increases your risk of heart disease, stroke, diabetes, high blood pressure, and several types of cancer. Reducing your calorie intake and exercising can help you lose weight. Exercise is usually categorized as moderate or vigorous intensity. To lose weight, most people need to do a certain amount of moderate-intensity or vigorous-intensity exercise each week. Moderate-intensity exercise  Moderate-intensity exercise is any activity that gets you moving enough to burn at least three times more energy (calories) than if you were sitting. Examples of moderate exercise include:  Walking a mile in 15 minutes.  Doing light yard work.  Biking at an easy pace. Most people should get at least 150 minutes (2 hours and 30 minutes) a week of moderate-intensity exercise to maintain their body weight. Vigorous-intensity exercise Vigorous-intensity exercise is any activity that gets you moving enough to burn at least six times more calories than if you were sitting. When you exercise at this intensity, you should be working hard enough that you are not able to carry on a conversation. Examples of vigorous exercise include:  Running.  Playing a team sport, such as football, basketball, and soccer.  Jumping rope. Most people should get at least 75 minutes (1 hour and 15 minutes) a week of vigorous-intensity exercise to maintain their body weight. How can exercise affect me? When you exercise enough to burn more calories than you eat, you lose weight. Exercise also reduces body fat and builds muscle. The  more muscle you have, the more calories you burn. Exercise also:  Improves mood.  Reduces stress and tension.  Improves your overall fitness, flexibility, and endurance.  Increases bone strength. The amount of exercise you need to lose weight depends on:  Your age.  The type of exercise.  Any health conditions you have.  Your overall physical ability. Talk to your health care provider about how much exercise you need and what types of activities are safe for you. What actions can I take to lose weight? Nutrition   Make changes to your diet as told by your health care provider or diet and nutrition specialist (dietitian). This may include: ? Eating fewer calories. ? Eating more protein. ? Eating less unhealthy fats. ? Eating a diet that includes fresh fruits and vegetables, whole grains, low-fat dairy products, and lean protein. ? Avoiding foods with added fat, salt, and sugar.  Drink plenty of water while you exercise to prevent dehydration or heat stroke. Activity  Choose an activity that you enjoy and set realistic goals. Your health care provider can help you make an exercise plan that works for you.  Exercise at a moderate or vigorous intensity most days of the week. ? The intensity of exercise may vary from person to person. You can tell how intense a workout is for you by paying attention to your breathing and heartbeat. Most people will notice their breathing and heartbeat get faster with more intense exercise.  Do resistance training twice each week, such as: ? Push-ups. ? Sit-ups. ? Lifting weights. ? Using resistance bands.  Getting short amounts of exercise can be just as helpful as long structured periods of exercise. If you  have trouble finding time to exercise, try to include exercise in your daily routine. ? Get up, stretch, and walk around every 30 minutes throughout the day. ? Go for a walk during your lunch break. ? Park your car farther away from your  destination. ? If you take public transportation, get off one stop early and walk the rest of the way. ? Make phone calls while standing up and walking around. ? Take the stairs instead of elevators or escalators.  Wear comfortable clothes and shoes with good support.  Do not exercise so much that you hurt yourself, feel dizzy, or get very short of breath. Where to find more information  U.S. Department of Health and Human Services: [employer]  Centers for Disease Control and Prevention (CDC): http://www.wolf.info/ Contact a health care provider:  Before starting a new exercise program.  If you have questions or concerns about your weight.  If you have a medical problem that keeps you from exercising. Get help right away if you have any of the following while exercising:  Injury.  Dizziness.  Difficulty breathing or shortness of breath that does not go away when you stop exercising.  Chest pain.  Rapid heartbeat. Summary  Being overweight increases your risk of heart disease, stroke, diabetes, high blood pressure, and several types of cancer.  Losing weight happens when you burn more calories than you eat.  Reducing the amount of calories you eat in addition to getting regular moderate or vigorous exercise each week helps you lose weight. This information is not intended to replace advice given to you by your health care provider. Make sure you discuss any questions you have with your health care provider. Document Revised: 03/08/2017 Document Reviewed: 03/08/2017 Elsevier Patient Education  2020 [employer].

## 2020-01-12 NOTE — Progress Notes (Signed)
01/12/2020  History of Present Illness:  Lynn Boyd is a 34 y.o. female s/p robotic umbilical and RLQ incisional hernia repair and excision of RUQ mass on 09/07/19.  She presents today for follow up and to discuss her keloid scars.  She reports that she has gained weight but is trying to eat better and starting to do more cardio exercises now.  She reports having some occasional twinge at the LUQ incision where the fibroid was extracted through.  She's considering weight loss surgery vs panniculectomy, but those are expensive procedures.  Past Medical History: Past Medical History:  Diagnosis Date  . Anxiety   . Cardiomyopathy ([place])    HYPERTROPHIC  . Chronic blood loss anemia    iron infusions  . Depression   . Endometriosis   . Enlarged heart   . Fibroid uterus   . Heart murmur   . Herpes simplex virus (HSV) infection   . History of blood transfusion 2016   2 units, given for chronic blood loss anemia   . Hx MRSA infection    wound that required a drain  . Hypertension   . Sleep apnea    OSA, not currently using CPAP  . TOA (tubo-ovarian abscess) 11/28/2014  . Vaginal Pap smear, abnormal 2017   ASCUS with HPV+     Past Surgical History: Past Surgical History:  Procedure Laterality Date  . endometreoisis     surgery  . INCISIONAL HERNIA REPAIR  09/07/2019   Procedure: ROBOTIC ASSISTED INCISIONAL HERNIA x5;  Surgeon: Olean Ree, MD;  Location: [facility] ORS;  Service: General;;  . IR IMAGE GUIDED DRAINAGE BY PERCUTANEOUS CATHETER  2016   left tubo-ovarian abscess, performed at [place]  09/07/2019   Procedure: ROBOTIC ASSISTED EXCISION OF RIGHT UPPER QUADRANT  MASS;  Surgeon: Rubie Maid, MD;  Location: [facility] ORS;  Service: Gynecology;;  . Kristine Royal ASSISTED MYOMECTOMY  05/2010   [facility]  . ROBOT ASSISTED MYOMECTOMY N/A 08/24/2018   Procedure: ROBOTIC ASSISTED MYOMECTOMY;  Surgeon: Rubie Maid, MD;  Location: [facility] ORS;  Service: Gynecology;  Laterality: N/A;  .  ROBOTIC ASSISTED LAPAROSCOPIC LYSIS OF ADHESION N/A 08/24/2018   Procedure: ROBOTIC ASSISTED LAPAROSCOPIC LYSIS OF ADHESION;  Surgeon: Rubie Maid, MD;  Location: [facility] ORS;  Service: Gynecology;  Laterality: N/A;    Home Medications: Prior to Admission medications   Medication Sig Start Date End Date Taking? Authorizing Provider  acetaminophen (TYLENOL) 325 MG tablet Take 650 mg by mouth every 6 (six) hours as needed.    Yes [provider]  adapalene (DIFFERIN) 0.1 % gel Apply 1 application topically every other day.   Yes [provider]  cholecalciferol (VITAMIN D3) 25 MCG (1000 UT) tablet Take 1,000 Units by mouth daily.    Yes [provider]  clindamycin (CLINDAGEL) 1 % gel Apply 1 application topically daily.  06/17/19  Yes [provider]  Elagolix Sodium (ORILISSA) 150 MG TABS Take 1 tablet by mouth daily. 12/21/19  Yes Rubie Maid, MD  ibuprofen (ADVIL) 800 MG tablet Take 1 tablet (800 mg total) by mouth every 8 (eight) hours as needed. 09/08/19  Yes Edison Simon R, PA-C  metoprolol succinate (TOPROL-XL) 50 MG 24 hr tablet Take 50 mg by mouth daily. 05/11/19  Yes [provider]  naproxen sodium (ALEVE) 220 MG tablet Take 220 mg by mouth.   Yes [provider]  spironolactone (ALDACTONE) 50 MG tablet Take 100 mg by mouth daily.    Yes [provider]  tretinoin (RETIN-A) 0.025 % cream Apply 1 application topically at bedtime.    Yes [provider]  cyclobenzaprine (FLEXERIL) 5 MG tablet Take 1 tablet (5 mg total) by mouth 3 (three) times daily as needed for muscle spasms. Patient not taking: Reported on 10/04/2019 09/20/19   Olean Ree, MD    Allergies: Allergies  Allergen Reactions  . Bactrim [Sulfamethoxazole-Trimethoprim] Swelling  . Latex Rash    Review of Systems: Review of Systems  Constitutional: Negative for chills and fever.  Respiratory: Negative for shortness of breath.   Cardiovascular:  Negative for chest pain.  Gastrointestinal: Negative for abdominal pain, nausea and vomiting.    Physical Exam BP 126/86   Pulse 98   Temp 100.3 F (37.9 C) (Oral)   Ht 5\' 2"  (1.575 m)   Wt 224 lb 9.6 oz (101.9 kg)   SpO2 95%   BMI 41.08 kg/m  CONSTITUTIONAL: No acute distress HEENT:  Normocephalic, atraumatic, extraocular motion intact. RESPIRATORY:  Normal respiratory effort without pathologic use of accessory muscles. CARDIOVASCULAR:  Regular rhythm and rate. GI: The abdomen is soft, non-distended, non-tender to palpation.  The robotic incisions are well healed, without any breakdown or keloid formation.  No evidence of hernia recurrence or hernia at her left sided incisions.  She has three keloid scars from prior surgery which are stable. NEUROLOGIC:  Motor and sensation is grossly normal.  Cranial nerves are grossly intact. PSYCH:  Alert and oriented to person, place and time. Affect is normal.  Labs/Imaging: None recently  Assessment and Plan: This is a 34 y.o. female s/p robotic umbilical and incisional hernia repair and excision of RUQ mass.  --Discussed with her that she's healed well and from surgical standpoint, she can proceed with cardio and weight lifting exercises.   --She is interested in having the keloid scars excised.  Discussed with her that this can be done as office procedure.  We'll schedule her on 01/26/20.  Face-to-face time spent with the patient and care providers was 15 minutes, with more than 50% of the time spent counseling, educating, and coordinating care of the patient.     Melvyn Neth, [facility]

## 2020-01-26 ENCOUNTER — Ambulatory Visit (INDEPENDENT_AMBULATORY_CARE_PROVIDER_SITE_OTHER): Payer: BC Managed Care – PPO | Admitting: Surgery

## 2020-01-26 ENCOUNTER — Other Ambulatory Visit: Payer: Self-pay

## 2020-01-26 ENCOUNTER — Encounter: Payer: Self-pay | Admitting: Surgery

## 2020-01-26 VITALS — BP 132/86 | HR 72 | Temp 98.0°F | Ht 62.0 in | Wt 226.0 lb

## 2020-01-26 DIAGNOSIS — L910 Hypertrophic scar: Secondary | ICD-10-CM

## 2020-01-26 MED ORDER — OXYCODONE HCL 5 MG PO TABS: 5.0000 mg | ORAL_TABLET | Freq: Four times a day (QID) | ORAL | 0 refills | Status: DC | PRN

## 2020-01-26 NOTE — Progress Notes (Signed)
  Procedure Date:  01/26/2020  Pre-operative Diagnosis:   Keloid scars of abdominal wall  Post-operative Diagnosis:   Keloid scars of abdominal wall  Procedure:  Excision of two keloid scars of abdominal wall; layered closure of two abdominal wall incisions measuring 4 cm and 7 cm.  Surgeon:  Melvyn Neth, MD  Anesthesia:  12 ml of 1% lidocaine with epi  Estimated Blood Loss:  7 ml  Specimens:  None  Complications:  None  Indications for Procedure:  This is a 34 y.o. female with diagnosis of five keloid scars of the abdominal wall from prior surgeries.  She's interested in excising them, and wants to proceed with two of the scars today. The risks of bleeding, abscess or infection, injury to surrounding structures, and need for further procedures were all discussed with the patient and she was willing to proceed.  Description of Procedure: The patient was correctly identified at bedside.  The patient was placed supine.  Appropriate time-outs were performed.  The patient's abdomen was prepped and draped in usual sterile fashion.  We proceeded to mark the two keloid scars to be removed today in the RLQ.  One elliptical mark measured 4 cm, and the other one 7 cm.  Local anesthetic was infused intradermally over these two areas.  An elliptical incision was made over the smaller scar, and scalpel was used to dissect down the skin and subcutaneous tissue.  The keloid scar with surrounding normal skin was excised intact.  The cavity was then irrigated and hemostasis was assured with manual pressure.  The wound was then closed in two layers using 3-0 Vicryl and 4-0 Monocryl.  The incision was cleaned and sealed with DermaBond.  We then proceeded with the larger scar, and an elliptical incision was made over the larger scar, and scalpel was used in same fashion to dissect and resect the scar along with surrounding normal skin.  The wound was irrigated and hemostasis was assured with manual  pressure.  The wound was then closed in two layers using 3-0 Vicryl and 4-0 Monocryl.  The incision was cleaned and sealed with DermaBond.  The patient tolerated the procedure well and all sharps were appropriately disposed of at the end of the case.   --Instructed patient on activity restrictions to avoid stretching of the two incisions so they can heal well. --Patient will follow up in three weeks to check on her wounds and to proceed with excision of the remaining 3 keloid scars. --May take Tylenol and Ibuprofen and will also prescribe oxycodone for breakthrough pain control.   Melvyn Neth, MD

## 2020-01-26 NOTE — Patient Instructions (Addendum)
Today we have removed Keloids in our office. Please see information below regarding this.  If you need a refill of the Oxycodone or have questions or concerns, please call our office.   You are free to shower tomorrow, do not rub/scrub the areas. Do not submerge the areas until fully healed.  You have glue on your skin and sutures under the skin. The glue will come off on it's own in 10-14 days. You may shower normally until this occurs but do not submerge.  Please use Tylenol or Ibuprofen for pain as needed. Use ice to the areas several times a day for the next two days. Only use the narcotic pain medication if you need it.   You may continue your regular activities right away but if you are having pain while doing something, stop what you are doing and try this activity once again in 3 days. Please call our office with any questions or concerns prior to your appointment.  We will schedule you for removal of the other Keloids in about 3 weeks.    Keloid Removal Lipoma removal is a surgical procedure to remove built up scan tissue. Keloid removal may also be done for cosmetic reasons. Tell a health care provider about:  Any allergies you have.  All medicines you are taking, including vitamins, herbs, eye drops, creams, and over-the-counter medicines.  Any problems you or family members have had with anesthetic medicines.  Any blood disorders you have.  Any surgeries you have had.  Any medical conditions you have.  Whether you are pregnant or may be pregnant. What are the risks? Generally, this is a safe procedure. However, problems may occur, including:  Infection.  Bleeding.  Allergic reactions to medicines.  Damage to nerves or blood vessels near the lipoma.  Scarring.  Medicines  Ask your health care provider about: ? Changing or stopping your regular medicines. This is especially important if you are taking diabetes medicines or blood thinners. ? Taking medicines  such as aspirin and ibuprofen. These medicines can thin your blood. Do not take these medicines before your procedure if your health care provider instructs you not to.  You may be given antibiotic medicine to help prevent infection. General instructions  Ask your health care provider how your surgical site will be marked or identified.  You will have a physical exam. Your health care provider will check the size of the lipoma and whether it can be moved easily.  You may have imaging tests, such as: ? X-rays. ? CT scan. ? MRI.  Plan to have someone take you home from the hospital or clinic. What happens during the procedure?  To reduce your risk of infection: ? Your health care team will wash or sanitize their hands. ? Your skin will be washed with soap.  You will be given one or more of the following: ? A medicine to help you relax (sedative). ? A medicine to numb the area (local anesthetic). ? A medicine to make you fall asleep (general anesthetic). ? A medicine that is injected into an area of your body to numb everything below the injection site (regional anesthetic).  An incision will be made over the lipoma or very near the lipoma. The incision may be made in a natural skin line or crease.  Tissues, nerves, and blood vessels near the lipoma will be moved out of the way.  The lipoma and the capsule that surrounds it will be separated from the surrounding tissues.  The lipoma will be removed.  The incision may be closed with stitches (sutures).  A bandage (dressing) will be placed over the incision. What happens after the procedure?  Do not drive for 24 hours if you received a sedative.  Your blood pressure, heart rate, breathing rate, and blood oxygen level will be monitored until the medicines you were given have worn off. This information is not intended to replace advice given to you by your health care provider. Make sure you discuss any questions you have with  your health care provider. Document Released: 05/09/2015 Document Revised: 08/01/2015 Document Reviewed: 05/09/2015 Elsevier Interactive Patient Education  [employer].

## 2020-02-16 ENCOUNTER — Other Ambulatory Visit: Payer: Self-pay

## 2020-02-16 ENCOUNTER — Encounter: Payer: Self-pay | Admitting: Surgery

## 2020-02-16 ENCOUNTER — Ambulatory Visit: Payer: BC Managed Care – PPO | Admitting: Surgery

## 2020-02-16 DIAGNOSIS — L910 Hypertrophic scar: Secondary | ICD-10-CM

## 2020-02-16 MED ORDER — OXYCODONE HCL 5 MG PO TABS: 5.0000 mg | ORAL_TABLET | Freq: Four times a day (QID) | ORAL | 0 refills | Status: DC | PRN

## 2020-02-16 NOTE — Patient Instructions (Signed)
Today we have removed a Lipoma in our office. Please see information below regarding this type of tumor.  You are free to shower tomorrow, do not scrub at the areas. Rinse well and pat dry.  You have glue on your skin and sutures under the skin. The glue will come off on it's own in 10-14 days. You may shower normally until this occurs but do not submerge.  Please use Tylenol or Ibuprofen for pain as needed. You may also use ice to the areas several times a day.   We will see you back in 2 weeks to ensure that this has healed and to review the final pathology. Please see your appointment below. You may continue your regular activities right away but if you are having pain while doing something, stop what you are doing and try this activity once again in 3 days. Please call our office with any questions or concerns prior to your appointment.   Lipoma Removal Lipoma removal is a surgical procedure to remove a noncancerous (benign) tumor that is made up of fat cells (lipoma). Most lipomas are small and painless and do not require treatment. They can form in many areas of the body but are most common under the skin of the back, shoulders, arms, and thighs. You may need lipoma removal if you have a lipoma that is large, growing, or causing discomfort. Lipoma removal may also be done for cosmetic reasons. Tell a health care provider about:  Any allergies you have.  All medicines you are taking, including vitamins, herbs, eye drops, creams, and over-the-counter medicines.  Any problems you or family members have had with anesthetic medicines.  Any blood disorders you have.  Any surgeries you have had.  Any medical conditions you have.  Whether you are pregnant or may be pregnant. What are the risks? Generally, this is a safe procedure. However, problems may occur, including:  Infection.  Bleeding.  Allergic reactions to medicines.  Damage to nerves or blood vessels near the  lipoma.  Scarring.  What happens before the procedure? Staying hydrated Follow instructions from your health care provider about hydration, which may include:  Up to 2 hours before the procedure - you may continue to drink clear liquids, such as water, clear fruit juice, black coffee, and plain tea.  Eating and drinking restrictions Follow instructions from your health care provider about eating and drinking, which may include:  8 hours before the procedure - stop eating heavy meals or foods such as meat, fried foods, or fatty foods.  6 hours before the procedure - stop eating light meals or foods, such as toast or cereal.  6 hours before the procedure - stop drinking milk or drinks that contain milk.  2 hours before the procedure - stop drinking clear liquids.  Medicines  Ask your health care provider about: ? Changing or stopping your regular medicines. This is especially important if you are taking diabetes medicines or blood thinners. ? Taking medicines such as aspirin and ibuprofen. These medicines can thin your blood. Do not take these medicines before your procedure if your health care provider instructs you not to.  You may be given antibiotic medicine to help prevent infection. General instructions  Ask your health care provider how your surgical site will be marked or identified.  You will have a physical exam. Your health care provider will check the size of the lipoma and whether it can be moved easily.  You may have imaging tests, such  as: ? X-rays. ? CT scan. ? MRI.  Plan to have someone take you home from the hospital or clinic. What happens during the procedure?  To reduce your risk of infection: ? Your health care team will wash or sanitize their hands. ? Your skin will be washed with soap.  You will be given one or more of the following: ? A medicine to help you relax (sedative). ? A medicine to numb the area (local anesthetic). ? A medicine to make  you fall asleep (general anesthetic). ? A medicine that is injected into an area of your body to numb everything below the injection site (regional anesthetic).  An incision will be made over the lipoma or very near the lipoma. The incision may be made in a natural skin line or crease.  Tissues, nerves, and blood vessels near the lipoma will be moved out of the way.  The lipoma and the capsule that surrounds it will be separated from the surrounding tissues.  The lipoma will be removed.  The incision may be closed with stitches (sutures).  A bandage (dressing) will be placed over the incision. What happens after the procedure?  Do not drive for 24 hours if you received a sedative.  Your blood pressure, heart rate, breathing rate, and blood oxygen level will be monitored until the medicines you were given have worn off. This information is not intended to replace advice given to you by your health care provider. Make sure you discuss any questions you have with your health care provider. Document Released: 05/09/2015 Document Revised: 08/01/2015 Document Reviewed: 05/09/2015 Elsevier Interactive Patient Education  [employer].

## 2020-02-16 NOTE — Progress Notes (Signed)
  Procedure Date:  02/16/2020  Pre-operative Diagnosis:  Keloid scars of abdominal wall  Post-operative Diagnosis:   Keloid scars of abdominal wall  Procedure:  Excision of two keloid scars of abdominal wall; layered closure of two abdominal wall incisions measuring 3.5 and 4.5 cm.  Surgeon:  Melvyn Neth, MD  Anesthesia:  10 ml of 1% lidocaine mixed with bicarbonate  Estimated Blood Loss:  5 ml  Specimens:  None  Complications:  None  Indications for Procedure:  This is a 34 y.o. female with diagnosis of keloid scars of the abdominal wall from prior surgeries.  She had two scars excised on 11/19 and presents today for excision of two more areas.  The risks of bleeding, abscess or infection, injury to surrounding structures, and need for further procedures were all discussed with the patient and she was willing to proceed.  Description of Procedure: The patient was correctly identified at bedside.  The patient was placed supine.  Appropriate time-outs were performed.  The patient's mid abdomen was prepped and draped in usual sterile fashion.  The two keloid scars were marked with elliptical marks for each excision.  We started with the lateral scar.  Local anesthetic was infused intradermally.  A 3.5 cm elliptical incision was made over the scar, and scalpel was used to dissect down the skin to subcutaneous tissue, excising the scar completely.  The cavity was then irrigated and hemostasis was assured with 3-0 Vicryl suture x 1 at the lateral corner.  The wound was then closed in two layers using 3-0 Vicryl and 4-0 Monocryl.  The incision was cleaned and sealed with DermaBond.  We then proceeded to the medial scar.  Similarly, lidocaine with injected, and a 4.5 cm elliptical incision was made over the scar, and scalpel was used to dissect down the skin to subcutaneous tissue, excising the scar completely.  The cavity was then irrigated and hemostasis was assured with 3-0 Vicryl suture x  1 at the medial corner.  The wound was then closed in two layers using 3-0 Vicryl and 4-0 Monocryl.  The incision was cleaned and sealed with DermaBond.  The patient tolerated the procedure well and all sharps were appropriately disposed of at the end of the case.   --May take Tylenol and Ibuprofen and will give new Rx for oxycodone for breakthrough pain.  Follow up in 1 week for wound check.   Melvyn Neth, MD

## 2020-02-23 ENCOUNTER — Other Ambulatory Visit: Payer: Self-pay

## 2020-02-23 ENCOUNTER — Ambulatory Visit (INDEPENDENT_AMBULATORY_CARE_PROVIDER_SITE_OTHER): Payer: BC Managed Care – PPO | Admitting: Surgery

## 2020-02-23 ENCOUNTER — Encounter: Payer: Self-pay | Admitting: Surgery

## 2020-02-23 VITALS — BP 146/96 | HR 66 | Ht 62.0 in | Wt 230.0 lb

## 2020-02-23 DIAGNOSIS — L910 Hypertrophic scar: Secondary | ICD-10-CM

## 2020-02-23 NOTE — Progress Notes (Signed)
02/23/2020  HPI: Lynn Boyd is a 34 y.o. female s/p excision of left abdominal wall keloids x2 on 02/16/2020.  She presents today for follow-up.  She reports that she has been doing well-to new incisions are healing well without complications.  Reports some soreness has been improving as well.  However she also reports that there is a piece of suture that is against taking from the right most incision from her surgery on 11/19.  Vital signs: BP (!) 146/96   Pulse 66   Ht 5\' 2"  (1.575 m)   Wt 230 lb (104.3 kg)   LMP 01/24/2020   SpO2 96%   BMI 42.07 kg/m    Physical Exam: Constitutional: No acute distress Abdomen: Soft, nondistended, nontender to palpation.  Incisions are healing well and are clean, dry, intact.  The right most excision from her procedure on 11/19 has a tail of Vicryl suture poking through the lateral quadrant.  This was excised with no complications.  Assessment/Plan: This is a 34 y.o. female s/p excision of left abdominal wall keloids x2.  -Patient is healing well currently with no complications.  Tail of Vicryl suture on her right most incision was removed without issues.  At this point we can proceed with follow-ups as needed.   Melvyn Neth, [facility]

## 2020-02-23 NOTE — Patient Instructions (Signed)
The areas will continue to flatten and soften over time. The sutures under the skin will dissolve over a few months.   May rub Vitamin-E oil or other emmolient agent in area 2-3 times a day to soften. You will need to use sunscreen for the next year on the area to minimize altered pigmentation of the site.  Follow-up with our office as needed.  Please call and ask to speak with a nurse if you develop questions or concerns.

## 2020-05-03 ENCOUNTER — Ambulatory Visit (INDEPENDENT_AMBULATORY_CARE_PROVIDER_SITE_OTHER): Payer: BC Managed Care – PPO | Admitting: Obstetrics and Gynecology

## 2020-05-03 ENCOUNTER — Encounter: Payer: Self-pay | Admitting: Obstetrics and Gynecology

## 2020-05-03 VITALS — Ht 62.0 in

## 2020-05-03 DIAGNOSIS — N809 Endometriosis, unspecified: Secondary | ICD-10-CM | POA: Diagnosis not present

## 2020-05-03 DIAGNOSIS — D251 Intramural leiomyoma of uterus: Secondary | ICD-10-CM

## 2020-05-03 DIAGNOSIS — N938 Other specified abnormal uterine and vaginal bleeding: Secondary | ICD-10-CM

## 2020-05-03 DIAGNOSIS — D250 Submucous leiomyoma of uterus: Secondary | ICD-10-CM | POA: Diagnosis not present

## 2020-05-03 MED ORDER — NORETHINDRONE 0.35 MG PO TABS: 1.0000 | ORAL_TABLET | Freq: Every day | ORAL | 3 refills | Status: DC

## 2020-05-03 NOTE — Progress Notes (Signed)
Televisit-Pt cycle has been on for 2 weeks and would like to discuss other options.

## 2020-05-03 NOTE — Progress Notes (Signed)
Virtual Visit via Telephone Note  I connected with [name] on 05/03/20 at  4:15 PM EST by telephone and verified that I am speaking with the correct person using two identifiers.  Location: Patient: Home Provider: Office   I discussed the limitations, risks, security and privacy concerns of performing an evaluation and management service by telephone and the availability of in person appointments. I also discussed with the patient that there may be a patient responsible charge related to this service. The patient expressed understanding and agreed to proceed.   History of Present Illness: Lynn Boyd is a 35 y.o. G0P0000 female with a h/o endometriosis and fibroid uterus who presents with complaints of abnormal menstrual cycle.  She notes that since decreasing her dosage of Orilissa from 200 mg to 150 mg she has had some breakthrough spotting (changed dosage in October of last year). Most recently notes that she reports that her cycles will skip 2 months or so, and then return on the 3rd month. Most recent cycle has been on for 2 weeks.   She does note that she has been under a lot more stress over these past few months.  She has recently become a foster parent starting last month.  She also does report receiving her COVID vaccination booster.    Observations/Objective: Height 5\' 2"  (1.575 m), last menstrual period 04/18/2020. Gen App: patient sounds well-appearing, normal speech Remainder of exam deferred.    Assessment and Plan:  1. DUB (dysfunctional uterine bleeding)   2. Intramural and submucous leiomyoma of uterus   3. Endometriosis     - Discussion had on potential causes of her bleeding including lower dose of Orilissa, new stressors (recentlly became a foster parents), also recently noted receiving booster injection (in late December/early January). Patient also has a history of fibroids and endometriosis.  Discussed option of trial of OCPs (progesterone-only) to help  with bleeding.    Follow Up Instructions:  Return to clinic for any scheduled appointments or for any gynecologic concerns as needed. Return sooner if symptoms worsen or fail to improve.  Needs to be scheduled for annual exam. .     I discussed the assessment and treatment plan with the patient. The patient was provided an opportunity to ask questions and all were answered. The patient agreed with the plan and demonstrated an understanding of the instructions.   The patient was advised to call back or seek an in-person evaluation if the symptoms worsen or if the condition fails to improve as anticipated.  I provided 12 minutes of non-face-to-face time during this encounter.   Rubie Maid, MD  [facility]

## 2020-05-06 ENCOUNTER — Telehealth: Payer: Self-pay | Admitting: Obstetrics and Gynecology

## 2020-05-06 NOTE — Telephone Encounter (Signed)
Completed.

## 2020-05-06 NOTE — Telephone Encounter (Signed)
-----   Message from Edwyna Shell, LPN sent at 5/57/3220 10:13 AM EST ----- Regarding: FW: schedule annual Please contact pt for annual exam in 4 months. Thanks Luana Shu ----- Message ----- From: Rubie Maid, MD Sent: 05/03/2020  10:09 PM EST To: Edwyna Shell, LPN Subject: schedule annual                                Please contact patient and have her scheduled for an annual exam in the next 4 months   Dr. Marcelline Mates

## 2020-05-23 ENCOUNTER — Encounter: Payer: Self-pay | Admitting: Obstetrics and Gynecology

## 2020-07-10 ENCOUNTER — Encounter: Payer: Self-pay | Admitting: Plastic Surgery

## 2020-07-10 ENCOUNTER — Other Ambulatory Visit: Payer: Self-pay

## 2020-07-10 ENCOUNTER — Ambulatory Visit (INDEPENDENT_AMBULATORY_CARE_PROVIDER_SITE_OTHER): Payer: BC Managed Care – PPO | Admitting: Plastic Surgery

## 2020-07-10 VITALS — BP 155/107 | HR 79 | Ht 62.0 in | Wt 234.2 lb

## 2020-07-10 DIAGNOSIS — L910 Hypertrophic scar: Secondary | ICD-10-CM | POA: Diagnosis not present

## 2020-07-10 NOTE — Progress Notes (Signed)
Referring Provider No referring provider defined for this encounter.   CC:  Chief Complaint  Patient presents with  . Advice Only      Lynn Boyd is an 35 y.o. female.  HPI: Patient presents to discuss her abdominal contour.  She is bothered by having a prominent abdomen which she has had since she was a child.  She has had about 50 pounds of weight gain recently and is getting ready to get set up with [facility] weight loss clinic at the end of this month.  Prior abdominal surgeries include surgery for uterine fibroids and hernia.  She is also bothered by keloids on her abdomen.  Allergies  Allergen Reactions  . Bactrim [Sulfamethoxazole-Trimethoprim] Swelling  . Latex Rash    Outpatient Encounter Medications as of 07/10/2020  Medication Sig  . acetaminophen (TYLENOL) 325 MG tablet Take 650 mg by mouth every 6 (six) hours as needed.   Marland Kitchen adapalene (DIFFERIN) 0.1 % gel Apply 1 application topically every other day.  . cholecalciferol (VITAMIN D3) 25 MCG (1000 UT) tablet Take 1,000 Units by mouth daily.   . clindamycin (CLINDAGEL) 1 % gel Apply 1 application topically daily.   . cyclobenzaprine (FLEXERIL) 5 MG tablet Take 1 tablet (5 mg total) by mouth 3 (three) times daily as needed for muscle spasms.  . Elagolix Sodium (ORILISSA) 150 MG TABS Take 1 tablet by mouth daily.  . metoprolol succinate (TOPROL-XL) 50 MG 24 hr tablet Take 50 mg by mouth daily.  . naproxen sodium (ALEVE) 220 MG tablet Take 220 mg by mouth.  . norethindrone (MICRONOR) 0.35 MG tablet Take 1 tablet (0.35 mg total) by mouth daily.  Marland Kitchen spironolactone (ALDACTONE) 50 MG tablet Take 100 mg by mouth daily.   Marland Kitchen tretinoin (RETIN-A) 0.025 % cream Apply 1 application topically at bedtime.    No facility-administered encounter medications on file as of 07/10/2020.     Past Medical History:  Diagnosis Date  . Anxiety   . Cardiomyopathy ([place])    HYPERTROPHIC  . Chronic blood loss anemia    iron infusions  . Depression    . Endometriosis   . Enlarged heart   . Fibroid uterus   . Heart murmur   . Herpes simplex virus (HSV) infection   . History of blood transfusion 2016   2 units, given for chronic blood loss anemia   . Hx MRSA infection    wound that required a drain  . Hypertension   . Sleep apnea    OSA, not currently using CPAP  . TOA (tubo-ovarian abscess) 11/28/2014  . Vaginal Pap smear, abnormal 2017   ASCUS with HPV+    Past Surgical History:  Procedure Laterality Date  . endometreoisis     surgery  . INCISIONAL HERNIA REPAIR  09/07/2019   Procedure: ROBOTIC ASSISTED INCISIONAL HERNIA x5;  Surgeon: Lynn Ree, MD;  Location: [facility] ORS;  Service: General;;  . IR IMAGE GUIDED DRAINAGE BY PERCUTANEOUS CATHETER  2016   left tubo-ovarian abscess, performed at [place]  09/07/2019   Procedure: ROBOTIC ASSISTED EXCISION OF RIGHT UPPER QUADRANT  MASS;  Surgeon: Lynn Maid, MD;  Location: [facility] OR[facility];  Service: Gynecology;;  . Lynn Boyd ASSISTED MYOMECTOMY  05/2010   [facility]  . ROBOT ASSISTED MYOMECTOMY N/A 08/24/2018   Procedure: ROBOTIC ASSISTED MYOMECTOMY;  Surgeon: Lynn Maid, MD;  Location: [facility] ORS;  Service: Gynecology;  Laterality: N/A;  . ROBOTIC ASSISTED LAPAROSCOPIC LYSIS OF ADHESION N/A 08/24/2018  Procedure: ROBOTIC ASSISTED LAPAROSCOPIC LYSIS OF ADHESION;  Surgeon: Lynn Maid, MD;  Location: [facility] OR[facility];  Service: Gynecology;  Laterality: N/A;    Family History  Problem Relation Age of Onset  . Hypertension Mother   . Diabetes Mother   . Hypertension Father     Social History   Social History Narrative  . Not on file     Review of Systems General: Denies fevers, chills, weight loss CV: Denies chest pain, shortness of breath, palpitations  Physical Exam Vitals with BMI 07/10/2020 05/03/2020 02/23/2020  Height 5\' 2"  5\' 2"  5\' 2"   Weight 234 lbs 3 oz - 230 lbs  BMI 02.72 - 53.66  Systolic 440 - 347  Diastolic 425 - 96  Pulse 79 - 66    General:  No acute  distress,  Alert and oriented, Non-Toxic, Normal speech and affect Abdomen: Abdomen is soft and nontender.  She has 3 transverse keloids that are about a centimeter wide and 3 to 4 cm long.  I do not appreciate any obvious hernias.  She does not have much excess skin but has some prominence with regards to intra and extra abdominal fat.  Assessment/Plan Patient came to discuss her abdominal contour.  At this point after her recent weight gain I do not think is a good time for her to pursue surgical excision.  After weight loss she will likely have some excess skin and less intra-abdominal fat and get a better result.  I encouraged her to lose the weight and then come back.  Regarding the keloids I do not feel that excision would give her a better result but I do think that injecting with steroids could give her some improvement.  She is going to think about it and let us know.  All of her questions were answered.  Lynn Boyd 07/10/2020, 4:45 PM

## 2020-09-06 ENCOUNTER — Encounter: Payer: BC Managed Care – PPO | Admitting: Obstetrics and Gynecology

## 2020-09-11 ENCOUNTER — Other Ambulatory Visit (HOSPITAL_COMMUNITY)
Admission: RE | Admit: 2020-09-11 | Discharge: 2020-09-11 | Disposition: A | Discharge status: Home / Self Care | Payer: BC Managed Care – PPO | Source: Ambulatory Visit | Attending: Obstetrics and Gynecology | Admitting: Obstetrics and Gynecology

## 2020-09-11 ENCOUNTER — Other Ambulatory Visit: Payer: Self-pay

## 2020-09-11 ENCOUNTER — Encounter: Payer: Self-pay | Admitting: Obstetrics and Gynecology

## 2020-09-11 ENCOUNTER — Ambulatory Visit (INDEPENDENT_AMBULATORY_CARE_PROVIDER_SITE_OTHER): Payer: BC Managed Care – PPO | Admitting: Obstetrics and Gynecology

## 2020-09-11 VITALS — BP 143/98 | HR 88 | Ht 62.0 in | Wt 229.3 lb

## 2020-09-11 DIAGNOSIS — Z124 Encounter for screening for malignant neoplasm of cervix: Secondary | ICD-10-CM | POA: Diagnosis present

## 2020-09-11 DIAGNOSIS — E66813 Obesity, class 3: Secondary | ICD-10-CM

## 2020-09-11 DIAGNOSIS — R7303 Prediabetes: Secondary | ICD-10-CM | POA: Diagnosis not present

## 2020-09-11 DIAGNOSIS — N809 Endometriosis, unspecified: Secondary | ICD-10-CM

## 2020-09-11 DIAGNOSIS — D250 Submucous leiomyoma of uterus: Secondary | ICD-10-CM

## 2020-09-11 DIAGNOSIS — Z01419 Encounter for gynecological examination (general) (routine) without abnormal findings: Secondary | ICD-10-CM

## 2020-09-11 DIAGNOSIS — I10 Essential (primary) hypertension: Secondary | ICD-10-CM

## 2020-09-11 DIAGNOSIS — D251 Intramural leiomyoma of uterus: Secondary | ICD-10-CM

## 2020-09-11 DIAGNOSIS — Z6841 Body Mass Index (BMI) 40.0 and over, adult: Secondary | ICD-10-CM

## 2020-09-11 MED ORDER — OZEMPIC (0.25 OR 0.5 MG/DOSE) 2 MG/1.5ML ~~LOC~~ SOPN: 0.5000 mg | PEN_INJECTOR | SUBCUTANEOUS | 6 refills | Status: DC

## 2020-09-11 MED ORDER — OZEMPIC (0.25 OR 0.5 MG/DOSE) 2 MG/1.5ML ~~LOC~~ SOPN: 0.2500 mg | PEN_INJECTOR | SUBCUTANEOUS | 0 refills | Status: DC

## 2020-09-11 NOTE — Progress Notes (Signed)
GYNECOLOGY ANNUAL PHYSICAL EXAM PROGRESS NOTE  Subjective:    Lynn Boyd is a 35 y.o. G0P0000 female who presents for an annual exam.  She has a past medical history significant for HTN, fibroid uterus, menorrhagia, anemia.  The patient is sexually active. The patient wears seatbelts: yes. The patient participates in regular exercise: yes. Has the patient ever been transfused or tattooed?: no. The patient reports that there is not domestic violence in her life.   The patient has no complaints today:  Reports going to a weight loss clinic recently, but has not helped much.  Notes she was tested and was prediabetic. Has lost 6 lbs over the past 2 months. Reports PCP attempted to start her on Ozempic, however required prior authorization. Also notes that she was having some difficulties figuring out a meal plan that would work best for her.  She has seen a nutritionist in the past.  States that her PCP also mentioned qualify for weight loss surgery, however patient notes that she has had several surgeries over the past few years and is not interested at this time. 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  Overall is a very rewarding experience for her.   Menstrual History: Menarche age: 62 Patient's last menstrual period was 09/04/2020 (approximate). Period Duration (Days): 6-7 Period Pattern: (!) Irregular Menstrual Flow: Moderate, Light Menstrual Control: Thin pad, Maxi pad Menstrual Control Change Freq (Hours): 3-6 Dysmenorrhea: (!) Moderate Dysmenorrhea Symptoms: Cramping, Nausea, Diarrhea, Headache   Gynecologic History:  Contraception: none. Reports ending a recent relationship.  History of STI's: HSV. No recent outbreaks.  Last Pap: 07/19/2017. Results were: normal.  Remote history of abnormal Pap smear in 2013 and 2017 (ASCUS HR HPV+).       Upstream - 09/11/20 1411       Pregnancy Intention Screening   Does the patient want to become pregnant in the next year?  Ok Either Way    Does the patient's partner want to become pregnant in the next year? N/A    Would the patient like to discuss contraceptive options today? No      Contraception Wrap Up   Current Method No Method - Other Reason            The pregnancy intention screening data noted above was reviewed. Potential methods of contraception were discussed. The patient elected to proceed with No Method - Other Reason.    OB History  Gravida Para Term Preterm AB Living  0 0 0 0 0 0  SAB IAB Ectopic Multiple Live Births  0 0 0 0 0    Past Medical History:  Diagnosis Date   Anxiety    Cardiomyopathy ([place])    HYPERTROPHIC   Chronic blood loss anemia    iron infusions   Depression    Endometriosis    Enlarged heart    Fibroid uterus    Heart murmur    Herpes simplex virus (HSV) infection    History of blood transfusion 2016   2 units, given for chronic blood loss anemia    Hx MRSA infection    wound that required a drain   Hypertension    Sleep apnea    OSA, not currently using CPAP   TOA (tubo-ovarian abscess) 11/28/2014   Vaginal Pap smear, abnormal 2017   ASCUS with HPV+    Past Surgical History:  Procedure Laterality Date   endometreoisis     surgery   INCISIONAL HERNIA REPAIR  09/07/2019   Procedure: ROBOTIC ASSISTED INCISIONAL HERNIA x5;  Surgeon: Olean Ree, MD;  Location: [facility];  Service: General;;   IR IMAGE GUIDED DRAINAGE BY PERCUTANEOUS CATHETER  2016   left tubo-ovarian abscess, performed at [place]  09/07/2019   Procedure: ROBOTIC ASSISTED EXCISION OF RIGHT UPPER QUADRANT  MASS;  Surgeon: Rubie Maid, MD;  Location: [facility];  Service: Gynecology;;   Kristine Royal ASSISTED MYOMECTOMY  05/2010   [facility]   ROBOT ASSISTED MYOMECTOMY N/A 08/24/2018   Procedure: ROBOTIC ASSISTED MYOMECTOMY;  Surgeon: Rubie Maid, MD;  Location: [facility];  Service: Gynecology;  Laterality: N/A;   ROBOTIC ASSISTED LAPAROSCOPIC LYSIS OF ADHESION N/A 08/24/2018    Procedure: ROBOTIC ASSISTED LAPAROSCOPIC LYSIS OF ADHESION;  Surgeon: Rubie Maid, MD;  Location: [facility];  Service: Gynecology;  Laterality: N/A;    Family History  Problem Relation Age of Onset   Hypertension Mother    Diabetes Mother    Hypertension Father     Social History   Socioeconomic History   Marital status: Single    Spouse name: Not on file   Number of children: Not on file   Years of education: Not on file   Highest education level: Not on file  Occupational History   Occupation: Education officer, museum    Employer: [employer]  Tobacco Use   Smoking status: Never   Smokeless tobacco: Never  Vaping Use   Vaping Use: Never used  Substance and Sexual Activity   Alcohol use: Yes    Alcohol/week: 0.0 standard drinks    Comment: socially and occa   Drug use: No   Sexual activity: Yes    Birth control/protection: None  Other Topics Concern   Not on file  Social History Narrative   Not on file   Social Determinants of Health   Financial Resource Strain: Not on file  Food Insecurity: Not on file  Transportation Needs: Not on file  Physical Activity: Not on file  Stress: Not on file  Social Connections: Not on file  Intimate Partner Violence: Not on file    Current Outpatient Medications on File Prior to Visit  Medication Sig Dispense Refill   acetaminophen (TYLENOL) 325 MG tablet Take 650 mg by mouth every 6 (six) hours as needed.      adapalene (DIFFERIN) 0.1 % gel Apply 1 application topically every other day.     cholecalciferol (VITAMIN D3) 25 MCG (1000 UT) tablet Take 1,000 Units by mouth daily.      clindamycin (CLINDAGEL) 1 % gel Apply 1 application topically daily.      Elagolix Sodium (ORILISSA) 150 MG TABS Take 1 tablet by mouth daily. 90 tablet 3   metoprolol succinate (TOPROL-XL) 50 MG 24 hr tablet Take 50 mg by mouth daily.     naproxen sodium (ALEVE) 220 MG tablet Take 220 mg by mouth.     spironolactone (ALDACTONE) 50 MG tablet Take 100 mg by  mouth daily.      tretinoin (RETIN-A) 0.025 % cream Apply 1 application topically at bedtime.      norethindrone (MICRONOR) 0.35 MG tablet Take 1 tablet (0.35 mg total) by mouth daily. (Patient not taking: Reported on 09/11/2020) 84 tablet 3   No current facility-administered medications on file prior to visit.    Allergies  Allergen Reactions   Bactrim [Sulfamethoxazole-Trimethoprim] Swelling   Latex Rash      Review of Systems Constitutional: negative for chills, fatigue, fevers and sweats Eyes: negative  for irritation, redness and visual disturbance Ears, nose, mouth, throat, and face: negative for hearing loss, nasal congestion, snoring and tinnitus Respiratory: negative for asthma, cough, sputum Cardiovascular: negative for chest pain, dyspnea, exertional chest pressure/discomfort, irregular heart beat, palpitations and syncope Gastrointestinal: negative for abdominal pain, change in bowel habits, nausea and vomiting Genitourinary: positive for abnormal menstrual periods (notes that her cycles are sometimes irregular, but still much lighter than before her myomectomy).  Negative for genital lesions, sexual problems and vaginal discharge, dysuria and urinary incontinence Integument/breast: negative for breast lump, breast tenderness and nipple discharge Hematologic/lymphatic: negative for bleeding and easy bruising Musculoskeletal:negative for back pain and muscle weakness Neurological: negative for dizziness, headaches, vertigo and weakness Endocrine: negative for diabetic symptoms including polydipsia, polyuria and skin dryness Allergic/Immunologic: negative for hay fever and urticaria       Objective:  Blood pressure (!) 143/98, pulse 88, height 5\' 2"  (1.575 m), weight 229 lb 4.8 oz (104 kg). Body mass index is 41.94 kg/m.  General Appearance:    Alert, cooperative, no distress, appears stated age, morbid obesity  Head:    Normocephalic, without obvious abnormality,  atraumatic  Eyes:    PERRL, conjunctiva/corneas clear, EOM's intact, both eyes  Ears:    Normal external ear canals, both ears  Nose:   Nares normal, septum midline, mucosa normal, no drainage or sinus tenderness  Throat:   Lips, mucosa, and tongue normal; teeth and gums normal  Neck:   Supple, symmetrical, trachea midline, no adenopathy; thyroid: no enlargement/tenderness/nodules; no carotid bruit or JVD  Back:     Symmetric, no curvature, ROM normal, no CVA tenderness  Lungs:     Clear to auscultation bilaterally, respirations unlabored  Chest Wall:    No tenderness or deformity   Heart:    Regular rate and rhythm, S1 and S2 normal, no murmur, rub or gallop  Breast Exam:    No tenderness, masses, or nipple abnormality  Abdomen:     Soft, non-tender, bowel sounds active all four quadrants, no masses, no organomegaly.    Genitalia:    Pelvic:external genitalia normal, vagina without lesions, discharge, or tenderness, rectovaginal septum  normal. Cervix normal in appearance, no cervical motion tenderness, no adnexal masses or tenderness.  Uterus slightly enlarged (~ 12-14 week size) normal shape, mobile, regular contours, nontender.  Rectal:    Normal external sphincter.  No hemorrhoids appreciated. Internal exam not done.   Extremities:   Extremities normal, atraumatic, no cyanosis or edema  Pulses:   2+ and symmetric all extremities  Skin:   Skin color, texture, turgor normal, no rashes or lesions  Lymph nodes:   Cervical, supraclavicular, and axillary nodes normal  Neurologic:   CNII-XII intact, normal strength, sensation and reflexes throughout   .  Labs:  Lab Results  Component Value Date   WBC 7.7 09/08/2019   HGB 12.0 09/08/2019   HCT 36.8 09/08/2019   MCV 84.6 09/08/2019   PLT 168 09/08/2019    Lab Results  Component Value Date   CREATININE 1.01 (H) 09/08/2019   BUN 13 09/08/2019   NA 138 09/08/2019   K 4.1 09/08/2019   CL 102 09/08/2019   CO2 25 09/08/2019    Lab  Results  Component Value Date   ALT 9 08/19/2018   AST 11 (L) 08/19/2018   ALKPHOS 45 08/19/2018   BILITOT 0.3 08/19/2018    No results found for: TSH   Assessment:   1. Encounter for well woman exam with routine  gynecological exam   2. Pap smear for cervical cancer screening   3. Prediabetes   4. Endometriosis   5. Intramural and submucous leiomyoma of uterus   6. Class 3 severe obesity with serious comorbidity and body mass index (BMI) of 40.0 to 44.9 in adult, unspecified obesity type ([place])   7. Essential hypertension       Plan:    - Blood tests: CBC with diff, Comprehensive metabolic panel, Lipoproteins, and TSH. - Breast self exam technique reviewed and patient encouraged to perform self-exam monthly. - Contraception: none. - Discussed healthy lifestyle modifications. - Pap smear performed today. - Endometriosis and history of menorrhagia managed with Freida Busman.  Patient questions if she can go back to the higher dosing.  I discussed that it is best to remain on the lower dosing as she still has an additional year that she can utilize. - Fibroid uterus, status post myomectomy.  Slightly enlarged uterus however patient remains relatively controlled with use of Orilissa.  Continue current management. -Hypertension managed by PCP, currently on Toprol-XL.  Patient blood pressure is elevated today.  She reports that she has not yet taken her medication today. -Obesity, patient currently attempting to work on weight loss through lifestyle modifications.  Notes her PCP also attempted to start her on Ozempic as she is prediabetic now, however notes she is still waiting on them to complete the prior authorization for medication.  Patient requests if I could attempt to prescribe for her.  Will order. -COVID vaccination status: Has completed 2 dose series.  Is eligible for booster. -Follow-up in 1 year for annual exam.  If she does initiate Ozempic, will need to follow-up in  approximately 3 months.   Rubie Maid, MD [facility]

## 2020-09-11 NOTE — Patient Instructions (Signed)
Breast Self-Awareness Breast self-awareness is knowing how your breasts look and feel. Doing breast self-awareness is important. It allows you to catch a breast problem early while it is still small and can be treated. All women should do breast self-awareness, including women who have had breast implants. Tell your doctorif you notice a change in your breasts. What you need: A mirror. A well-lit room. How to do a breast self-exam A breast self-exam is one way to learn what is normal for your breasts and tocheck for changes. To do a breast self-exam: Look for changes  Take off all the clothes above your waist. Stand in front of a mirror in a room with good lighting. Put your hands on your hips. Push your hands down. Look at your breasts and nipples in the mirror to see if one breast or nipple looks different from the other. Check to see if: The shape of one breast is different. The size of one breast is different. There are wrinkles, dips, and bumps in one breast and not the other. Look at each breast for changes in the skin, such as: Redness. Scaly areas. Look for changes in your nipples, such as: Liquid around the nipples. Bleeding. Dimpling. Redness. A change in where the nipples are.  Feel for changes  Lie on your back on the floor. Feel each breast. To do this, follow these steps: Pick a breast to feel. Put the arm closest to that breast above your head. Use your other arm to feel the nipple area of your breast. Feel the area with the pads of your three middle fingers by making small circles with your fingers. For the first circle, press lightly. For the second circle, press harder. For the third circle, press even harder. Keep making circles with your fingers at the different pressures as you move down your breast. Stop when you feel your ribs. Move your fingers a little toward the center of your body. Start making circles with your fingers again, this time going up until  you reach your collarbone. Keep making up-and-down circles until you reach your armpit. Remember to keep using the three pressures. Feel the other breast in the same way. Sit or stand in the tub or shower. With soapy water on your skin, feel each breast the same way you did in step 2 when you were lying on the floor.  Write down what you find Writing down what you find can help you remember what to tell your doctor. Write down: What is normal for each breast. Any changes you find in each breast, including: The kind of changes you find. Whether you have pain. Size and location of any lumps. When you last had your menstrual period. General tips Check your breasts every month. If you are breastfeeding, the best time to check your breasts is after you feed your baby or after you use a breast pump. If you get menstrual periods, the best time to check your breasts is 5-7 days after your menstrual period is over. With time, you will become comfortable with the self-exam, and you will begin to know if there are changes in your breasts. Contact a doctor if you: See a change in the shape or size of your breasts or nipples. See a change in the skin of your breast or nipples, such as red or scaly skin. Have fluid coming from your nipples that is not normal. Find a lump or thick area that was not there before. Have pain in   your breasts. Have any concerns about your breast health. Summary Breast self-awareness includes looking for changes in your breasts, as well as feeling for changes within your breasts. Breast self-awareness should be done in front of a mirror in a well-lit room. You should check your breasts every month. If you get menstrual periods, the best time to check your breasts is 5-7 days after your menstrual period is over. Let your doctor know of any changes you see in your breasts, including changes in size, changes on the skin, pain or tenderness, or fluid from your nipples that is  not normal. This information is not intended to replace advice given to you by your health care provider. Make sure you discuss any questions you have with your healthcare provider. Document Revised: 10/12/2017 Document Reviewed: 10/12/2017 Elsevier Patient Education  2022 Elsevier Inc. Preventive Care 21-39 Years Old, Female Preventive care refers to lifestyle choices and visits with your health care provider that can promote health and wellness. This includes: A yearly physical exam. This is also called an annual wellness visit. Regular dental and eye exams. Immunizations. Screening for certain conditions. Healthy lifestyle choices, such as: Eating a healthy diet. Getting regular exercise. Not using drugs or products that contain nicotine and tobacco. Limiting alcohol use. What can I expect for my preventive care visit? Physical exam Your health care provider may check your: Height and weight. These may be used to calculate your BMI (body mass index). BMI is a measurement that tells if you are at a healthy weight. Heart rate and blood pressure. Body temperature. Skin for abnormal spots. Counseling Your health care provider may ask you questions about your: Past medical problems. Family's medical history. Alcohol, tobacco, and drug use. Emotional well-being. Home life and relationship well-being. Sexual activity. Diet, exercise, and sleep habits. Work and work environment. Access to firearms. Method of birth control. Menstrual cycle. Pregnancy history. What immunizations do I need?  Vaccines are usually given at various ages, according to a schedule. Your health care provider will recommend vaccines for you based on your age, medicalhistory, and lifestyle or other factors, such as travel or where you work. What tests do I need?  Blood tests Lipid and cholesterol levels. These may be checked every 5 years starting at age 20. Hepatitis C test. Hepatitis B  test. Screening Diabetes screening. This is done by checking your blood sugar (glucose) after you have not eaten for a while (fasting). STD (sexually transmitted disease) testing, if you are at risk. BRCA-related cancer screening. This may be done if you have a family history of breast, ovarian, tubal, or peritoneal cancers. Pelvic exam and Pap test. This may be done every 3 years starting at age 21. Starting at age 30, this may be done every 5 years if you have a Pap test in combination with an HPV test. Talk with your health care provider about your test results, treatment options,and if necessary, the need for more tests. Follow these instructions at home: Eating and drinking  Eat a healthy diet that includes fresh fruits and vegetables, whole grains, lean protein, and low-fat dairy products. Take vitamin and mineral supplements as recommended by your health care provider. Do not drink alcohol if: Your health care provider tells you not to drink. You are pregnant, may be pregnant, or are planning to become pregnant. If you drink alcohol: Limit how much you have to 0-1 drink a day. Be aware of how much alcohol is in your drink. In the [place] one   the [place] one drink equals one 12 oz bottle of beer (355 mL), one 5 oz glass of wine (148 mL), or one 1 oz glass of hard liquor (44 mL).  Lifestyle Take daily care of your teeth and gums. Brush your teeth every morning and night with fluoride toothpaste. Floss one time each day. Stay active. Exercise for at least 30 minutes 5 or more days each week. Do not use any products that contain nicotine or tobacco, such as cigarettes, e-cigarettes, and chewing tobacco. If you need help quitting, ask your health care provider. Do not use drugs. If you are sexually active, practice safe sex. Use a condom or other form of protection to prevent STIs (sexually transmitted infections). If you do not wish to become pregnant, use a form of birth control. If you plan to become  pregnant, see your health care provider for a prepregnancy visit. Find healthy ways to cope with stress, such as: Meditation, yoga, or listening to music. Journaling. Talking to a trusted person. Spending time with friends and family. Safety Always wear your seat belt while driving or riding in a vehicle. Do not drive: If you have been drinking alcohol. Do not ride with someone who has been drinking. When you are tired or distracted. While texting. Wear a helmet and other protective equipment during sports activities. If you have firearms in your house, make sure you follow all gun safety procedures. Seek help if you have been physically or sexually abused. What's next? Go to your health care provider once a year for an annual wellness visit. Ask your health care provider how often you should have your eyes and teeth checked. Stay up to date on all vaccines. This information is not intended to replace advice given to you by your health care provider. Make sure you discuss any questions you have with your healthcare provider. Document Revised: 10/22/2019 Document Reviewed: 11/04/2017 Elsevier Patient Education  2022 [employer].

## 2020-09-11 NOTE — Progress Notes (Signed)
Pt present for annual exam. Pt stated that she was doing well.

## 2020-09-12 LAB — LIPID PANEL
Chol/HDL Ratio: 5.8 ratio — ABNORMAL HIGH (ref 0.0–4.4)
Cholesterol, Total: 214 mg/dL — ABNORMAL HIGH (ref 100–199)
HDL: 37 mg/dL — ABNORMAL LOW (ref >39)
LDL Chol Calc (NIH): 145 mg/dL — ABNORMAL HIGH (ref 0–99)
Triglycerides: 175 mg/dL — ABNORMAL HIGH (ref 0–149)
VLDL Cholesterol Cal: 32 mg/dL (ref 5–40)

## 2020-09-12 LAB — COMPREHENSIVE METABOLIC PANEL
ALT: 25 IU/L (ref 0–32)
AST: 32 IU/L (ref 0–40)
Albumin/Globulin Ratio: 1.6 (ref 1.2–2.2)
Albumin: 4.7 g/dL (ref 3.8–4.8)
Alkaline Phosphatase: 80 IU/L (ref 44–121)
BUN/Creatinine Ratio: 12 (ref 9–23)
BUN: 12 mg/dL (ref 6–20)
Bilirubin Total: 0.4 mg/dL (ref 0.0–1.2)
CO2: 20 mmol/L (ref 20–29)
Calcium: 9.8 mg/dL (ref 8.7–10.2)
Chloride: 101 mmol/L (ref 96–106)
Creatinine, Ser: 1.03 mg/dL — ABNORMAL HIGH (ref 0.57–1.00)
Globulin, Total: 3 g/dL (ref 1.5–4.5)
Glucose: 88 mg/dL (ref 65–99)
Potassium: 3.9 mmol/L (ref 3.5–5.2)
Sodium: 140 mmol/L (ref 134–144)
Total Protein: 7.7 g/dL (ref 6.0–8.5)
eGFR: 72 mL/min/{1.73_m2} (ref >59)

## 2020-09-12 LAB — TSH: TSH: 3.86 u[IU]/mL (ref 0.450–4.500)

## 2020-09-12 LAB — CBC
Hematocrit: 39.8 % (ref 34.0–46.6)
Hemoglobin: 13.4 g/dL (ref 11.1–15.9)
MCH: 26.7 pg (ref 26.6–33.0)
MCHC: 33.7 g/dL (ref 31.5–35.7)
MCV: 79 fL (ref 79–97)
Platelets: 242 10*3/uL (ref 150–450)
RBC: 5.02 x10E6/uL (ref 3.77–5.28)
RDW: 15.8 % — ABNORMAL HIGH (ref 11.7–15.4)
WBC: 5 10*3/uL (ref 3.4–10.8)

## 2020-09-19 LAB — CYTOLOGY - PAP
Adequacy: ABSENT
Comment: NEGATIVE
Diagnosis: NEGATIVE
High risk HPV: NEGATIVE

## 2020-11-26 ENCOUNTER — Telehealth: Payer: Self-pay | Admitting: Obstetrics and Gynecology

## 2020-11-26 NOTE — Telephone Encounter (Signed)
Pharmacy has called and stated that pts heart doctor has prescribed a medication that pt is needing to start but cant bc of a medication that we prescribe to her Freida Busman.  Pharmacy is needing a call back ASAP  Ninfa Linden - [phone]

## 2020-12-19 NOTE — Progress Notes (Signed)
GYNECOLOGY CLINIC PROGRESS NOTE Subjective:     Lynn Boyd is a 35 y.o. G0P0000 female here for several issues:   Follow up regarding weight loss. She has noted a weight gain of approximately 40 pounds over the last 1 year. She feels ideal weight is 160 pounds.  She was going to a weight loss clinic but did not feel that it was helping much. Previous treatments for obesity include  Contrave  (had to discontinue after short period due to side effects. Has tried twice.  Has also tried Wellbutrin in the past. Helped with mood symptoms she was experiencing at that time, but only had a small amount of weight loss. Obesity associated medical conditions: Prediabetes, hypertension and sleep apnea. We have been trying to get Ozempic covered but have run into issues so far.  She feel that she may have bacterial vaginosis. She has symptoms of odor and discharge. Discharge is clear. Denies vaginal itching or burning.  Currently on Orilissa for management of her endometriosis, fibroid uterus.  She wants to know if she is a candidate for Myfembree. She has been having some breakthrough spotting over the past few months.  Spotting lasts for a few days.  Does note she also has been under a lot of stress with her job lately.    Current Outpatient Medications on File Prior to Visit  Medication Sig Dispense Refill   acetaminophen (TYLENOL) 325 MG tablet Take 650 mg by mouth every 6 (six) hours as needed.      adapalene (DIFFERIN) 0.1 % gel Apply 1 application topically every other day.     cholecalciferol (VITAMIN D3) 25 MCG (1000 UT) tablet Take 1,000 Units by mouth daily.      clindamycin (CLINDAGEL) 1 % gel Apply 1 application topically daily.      Elagolix Sodium (ORILISSA) 150 MG TABS Take 1 tablet by mouth daily. 90 tablet 3   metoprolol succinate (TOPROL-XL) 50 MG 24 hr tablet Take 50 mg by mouth daily.     naproxen sodium (ALEVE) 220 MG tablet Take 220 mg by mouth.     norethindrone (MICRONOR) 0.35 MG  tablet Take 1 tablet (0.35 mg total) by mouth daily. (Patient not taking: Reported on 09/11/2020) 84 tablet 3   Semaglutide,0.25 or 0.5MG /DOS, (OZEMPIC, 0.25 OR 0.5 MG/DOSE,) 2 MG/1.5ML SOPN Inject 0.25 mg into the skin once a week. 1.5 mL 0   Semaglutide,0.25 or 0.5MG /DOS, (OZEMPIC, 0.25 OR 0.5 MG/DOSE,) 2 MG/1.5ML SOPN Inject 0.5 mg into the skin once a week. 1.5 mL 6   spironolactone (ALDACTONE) 50 MG tablet Take 100 mg by mouth daily.      tretinoin (RETIN-A) 0.025 % cream Apply 1 application topically at bedtime.      No current facility-administered medications on file prior to visit.   She is allergic to bactrim [sulfamethoxazole-trimethoprim] and latex..  Review of Systems Pertinent items noted in HPI and remainder of comprehensive ROS otherwise negative.   Objective:   Blood pressure (!) 149/81, pulse 74, resp. rate 16, height 5\' 2"  (1.575 m), weight 238 lb 4.8 oz (108.1 kg), last menstrual period 12/13/2020. Body mass index is 43.59 kg/m. General appearance: alert and no distress Remainder of exam deferred.    Assessment:   1. Class 3 severe obesity with serious comorbidity and body mass index (BMI) of 40.0 to 44.9 in adult, unspecified obesity type ([place])   2. Prediabetes   3. Vaginal discharge   4. Essential hypertension   5. History of endometriosis  6. History of uterine fibroid   7. Vaginal spotting    Plan:   Obesity - will attempt to contact insurance company to see which medications are covered for weight loss.  Vaginal discharge - patient allowed to collect self-swab. Will treat if positive.  Essential HTN - currently on medication History of endometriosis and fibroid - currently on Orilissa. Noting breakthrough spotting. Wonders if she is a candidate for [employer]. Discussed that Orilissa/Oriahnn and Myfembree were similar medications.  Patient had previously tried [place] and did not like due to breakthrough bleeding (likely due to add back therapy of the  estrogen component) . Vaginal spotting - unsure if due to vaginal discharge, medication use Freida Busman), or recent stress. Discussed managing stress levels, await Nuswab results, and can consider 1 month of add back estrogen therapy for the month.   A total of 25 minutes were spent face-to-face with the patient during this encounter and over half of that time involved counseling and coordination of care.   Rubie Maid, MD [facility]

## 2020-12-20 ENCOUNTER — Other Ambulatory Visit (HOSPITAL_COMMUNITY)
Admission: RE | Admit: 2020-12-20 | Discharge: 2020-12-20 | Disposition: A | Discharge status: Home / Self Care | Payer: BC Managed Care – PPO | Source: Ambulatory Visit | Attending: Obstetrics and Gynecology | Admitting: Obstetrics and Gynecology

## 2020-12-20 ENCOUNTER — Other Ambulatory Visit: Payer: Self-pay

## 2020-12-20 ENCOUNTER — Ambulatory Visit: Payer: BC Managed Care – PPO | Admitting: Obstetrics and Gynecology

## 2020-12-20 VITALS — BP 149/81 | HR 74 | Resp 16 | Ht 62.0 in | Wt 238.3 lb

## 2020-12-20 DIAGNOSIS — E66813 Obesity, class 3: Secondary | ICD-10-CM

## 2020-12-20 DIAGNOSIS — I10 Essential (primary) hypertension: Secondary | ICD-10-CM | POA: Diagnosis not present

## 2020-12-20 DIAGNOSIS — N898 Other specified noninflammatory disorders of vagina: Secondary | ICD-10-CM | POA: Diagnosis present

## 2020-12-20 DIAGNOSIS — N939 Abnormal uterine and vaginal bleeding, unspecified: Secondary | ICD-10-CM

## 2020-12-20 DIAGNOSIS — Z6841 Body Mass Index (BMI) 40.0 and over, adult: Secondary | ICD-10-CM

## 2020-12-20 DIAGNOSIS — R7303 Prediabetes: Secondary | ICD-10-CM

## 2020-12-20 DIAGNOSIS — Z8742 Personal history of other diseases of the female genital tract: Secondary | ICD-10-CM

## 2020-12-20 DIAGNOSIS — Z86018 Personal history of other benign neoplasm: Secondary | ICD-10-CM

## 2020-12-21 ENCOUNTER — Encounter: Payer: Self-pay | Admitting: Obstetrics and Gynecology

## 2020-12-24 LAB — CERVICOVAGINAL ANCILLARY ONLY
Bacterial Vaginitis (gardnerella): POSITIVE — AB
Candida Glabrata: NEGATIVE
Candida Vaginitis: NEGATIVE
Comment: NEGATIVE
Comment: NEGATIVE
Comment: NEGATIVE
Comment: NEGATIVE
Trichomonas: NEGATIVE

## 2021-01-17 ENCOUNTER — Telehealth: Payer: Self-pay | Admitting: Obstetrics and Gynecology

## 2021-01-17 NOTE — Telephone Encounter (Signed)
Pt called wanting to follow up on a PA for a medication for weight loss. Please Advise.

## 2021-01-17 NOTE — Telephone Encounter (Signed)
Spoke to pt concerning her call to the office. Pt was informed that [facility] would like for her to contact her insurance to see what medication her insurance will cover. Pt stated that she would contact her office and call back one day next week with the information.

## 2021-02-17 ENCOUNTER — Other Ambulatory Visit: Payer: Self-pay | Admitting: Obstetrics and Gynecology

## 2021-03-29 IMAGING — CT CT BIOPSY
1 of 2 series · 14 of 32 positions shown, 19 images · non-contrast
Comparison: none

INDICATION: Enlarging indeterminate right intra-abdominal mass

[Series 2: i-spiral 5.0 b30f · axial · 0.77mm/px · z∈[-233,-142]mm · 14 of 30 slices shown, 19 images]
[im 2/30  soft-tissue]
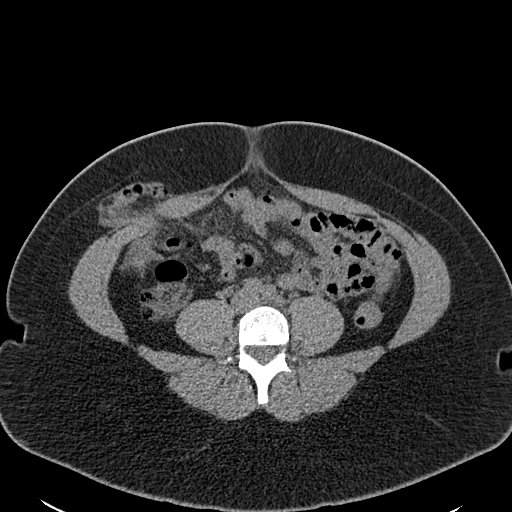
[im 2/30  bone]
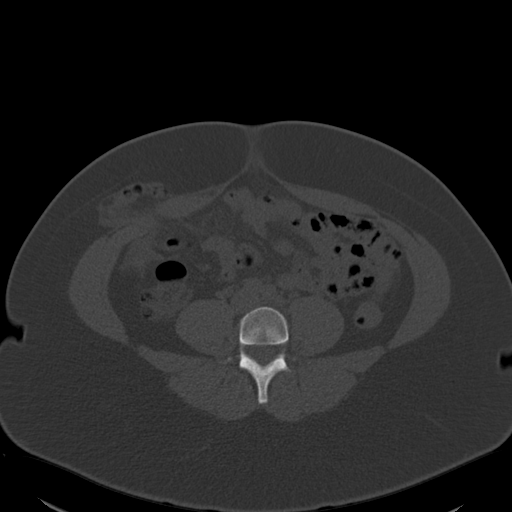
[im 4/30  soft-tissue]
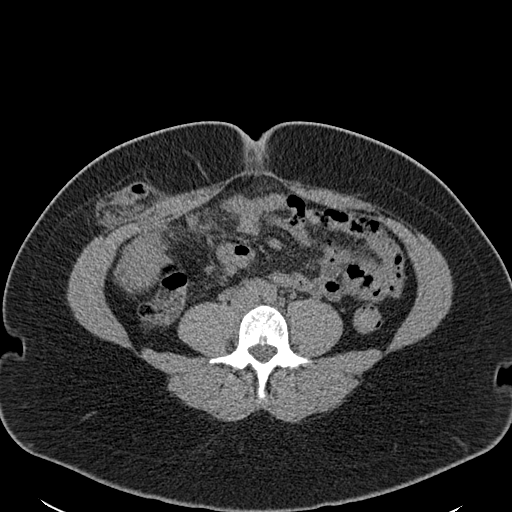
[im 7/30  soft-tissue]
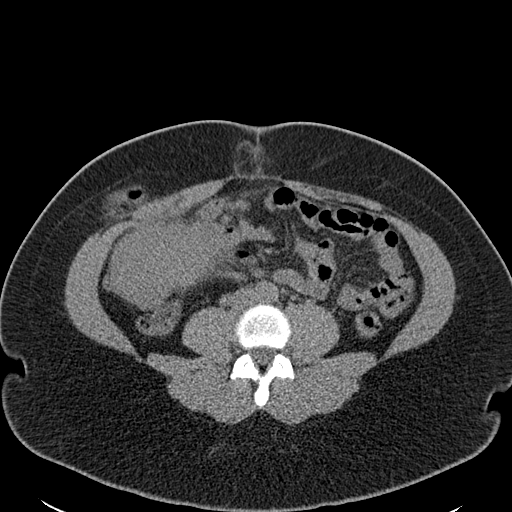
[im 9/30  soft-tissue]
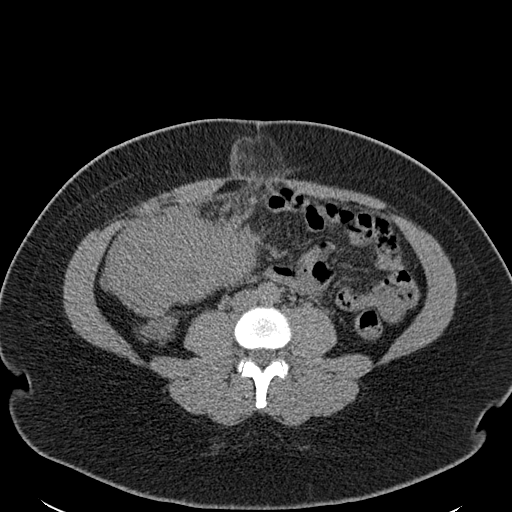
[im 11/30  soft-tissue]
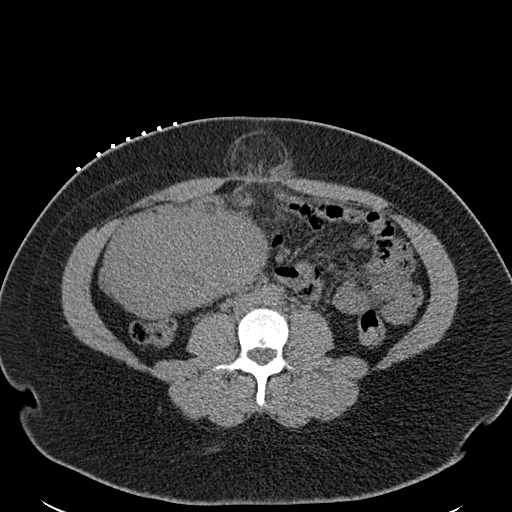
[im 12/30  soft-tissue]
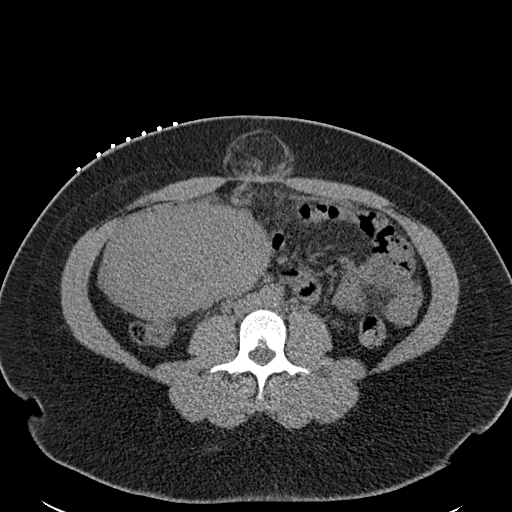
[im 16/30  soft-tissue]
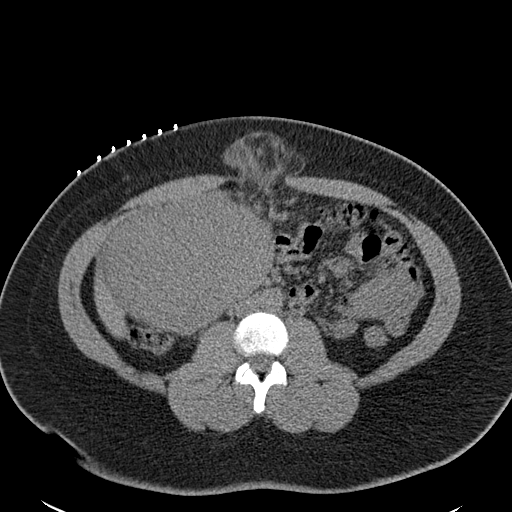
[im 18/30  soft-tissue]
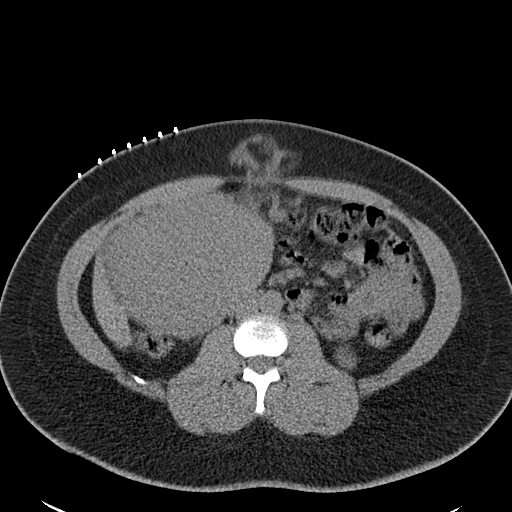
[im 19/30  soft-tissue]
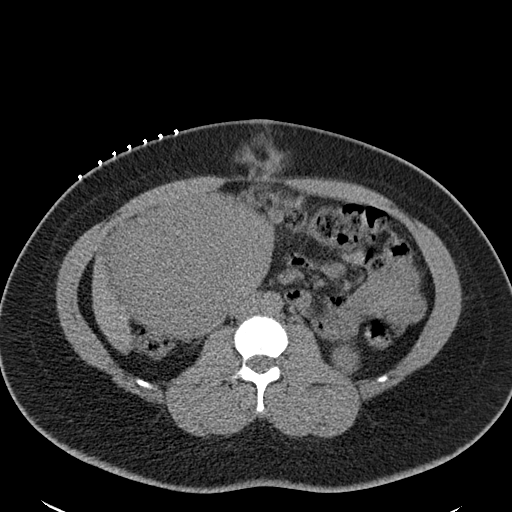
[im 19/30  bone]
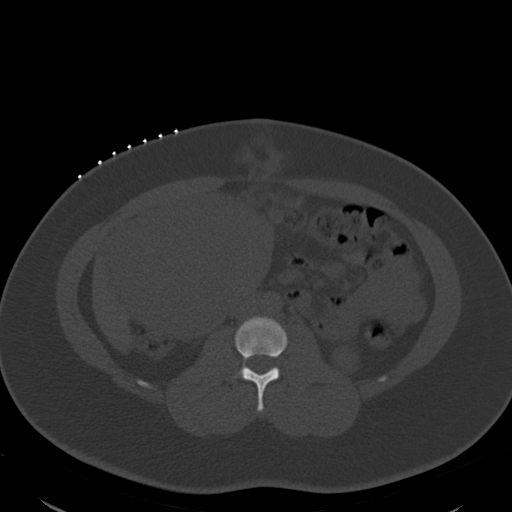
[im 21/30  soft-tissue]
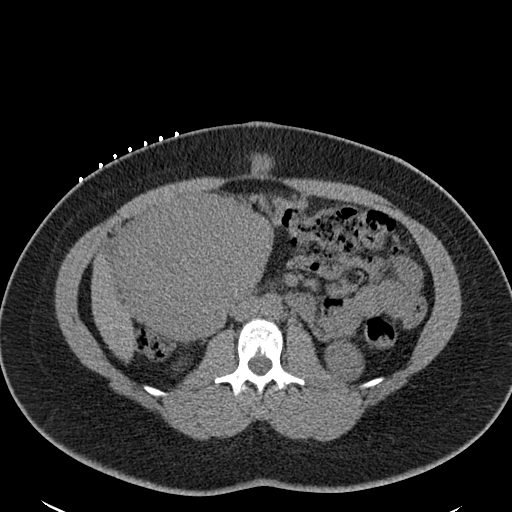
[im 23/30  soft-tissue]
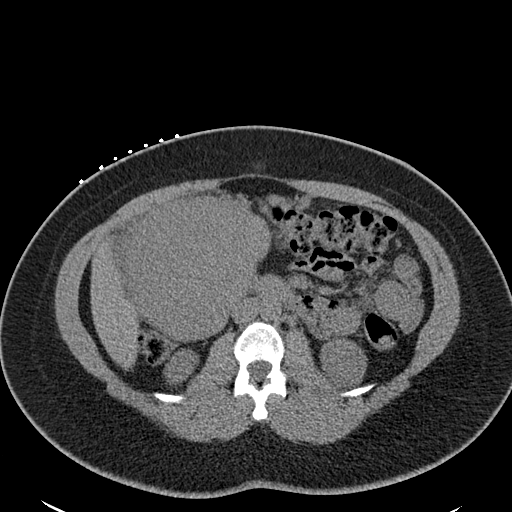
[im 23/30  lung]
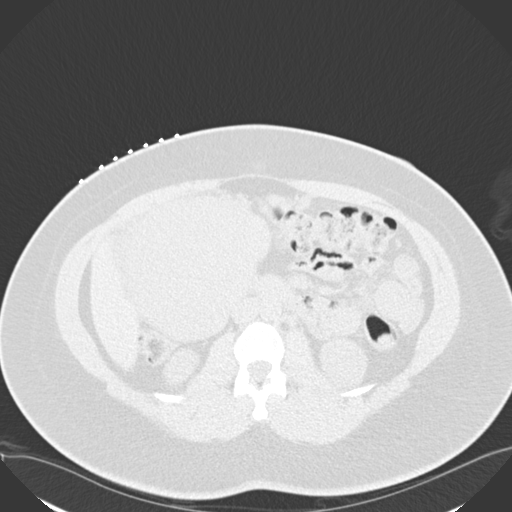
[im 24/30  lung]
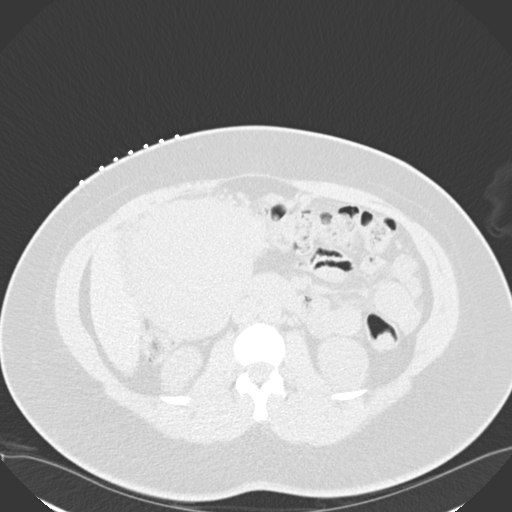
[im 26/30  soft-tissue]
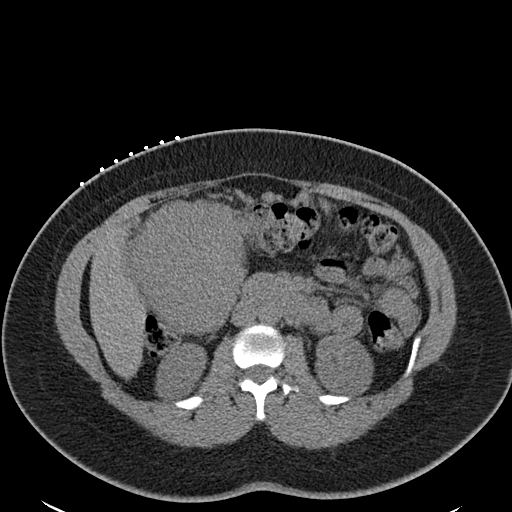
[im 26/30  lung]
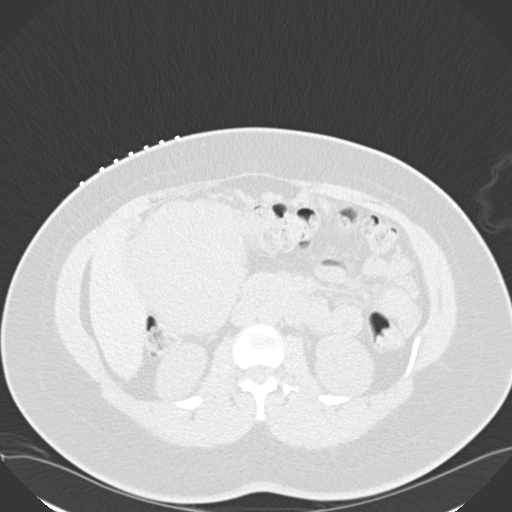
[im 28/30  soft-tissue]
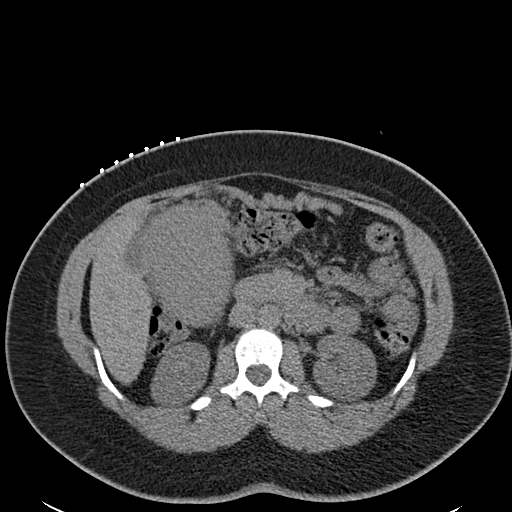
[im 28/30  lung]
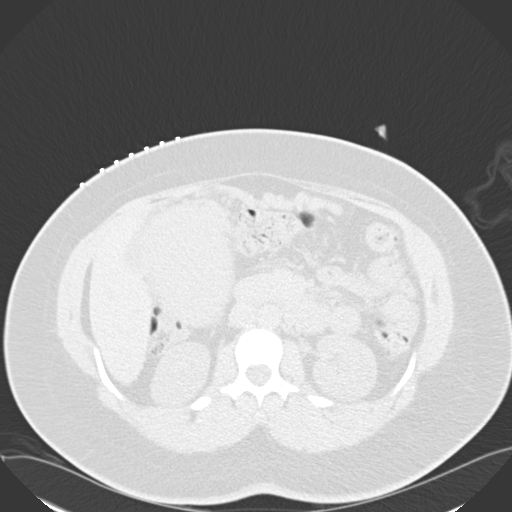

[14 of 32 positions shown; findings below may reference images not displayed]

EXAM:
CT-GUIDED BIOPSY RIGHT INTRA-ABDOMINAL MASS

MEDICATIONS:
1% LIDOCAINE LOCAL

ANESTHESIA/SEDATION:
2.0 mg IV Versed; 100 mcg IV Fentanyl

Moderate Sedation Time:  14 MINUTES

The patient was continuously monitored during the procedure by the
interventional radiology nurse under my direct supervision.

PROCEDURE:
The procedure, risks, benefits, and alternatives were explained to
the patient. Questions regarding the procedure were encouraged and
answered. The patient understands and consents to the procedure.

Previous imaging reviewed. Patient positioned supine. Noncontrast
localization CT performed. The right subhepatic intraperitoneal mass
was localized and marked for an anterolateral approach.

Under sterile conditions and local anesthesia, a 17 gauge 6.8 cm
access needle was advanced to the lesion. Needle position confirmed
with CT. 18 gauge core biopsies obtained. Samples placed in
formalin. Needle tract occluded with Gel-Foam. Postprocedure imaging
demonstrates no hemorrhage or hematoma.

Patient tolerated the procedure well without complication. Vital
sign monitoring by nursing staff during the procedure will continue
as patient is in the special procedures unit for post procedure
observation.
FINDINGS: The images document guide needle placement within the right
subhepatic intraperitoneal mass. Post biopsy images demonstrate no
hemorrhage or hematoma.

COMPLICATIONS:
None immediate.
IMPRESSION: Successful CT-guided core biopsy of the right subhepatic
intraperitoneal abdominal mass

## 2021-04-09 ENCOUNTER — Encounter: Payer: Self-pay | Admitting: Obstetrics and Gynecology

## 2021-04-13 ENCOUNTER — Other Ambulatory Visit: Payer: Self-pay | Admitting: Obstetrics and Gynecology

## 2021-05-06 ENCOUNTER — Encounter: Payer: Self-pay | Admitting: Obstetrics and Gynecology

## 2021-05-06 ENCOUNTER — Other Ambulatory Visit: Payer: Self-pay

## 2021-05-06 ENCOUNTER — Ambulatory Visit: Payer: BC Managed Care – PPO | Admitting: Obstetrics and Gynecology

## 2021-05-06 VITALS — BP 135/93 | HR 69 | Resp 16 | Ht 62.0 in | Wt 242.7 lb

## 2021-05-06 DIAGNOSIS — N809 Endometriosis, unspecified: Secondary | ICD-10-CM

## 2021-05-06 DIAGNOSIS — Z7689 Persons encountering health services in other specified circumstances: Secondary | ICD-10-CM

## 2021-05-06 DIAGNOSIS — Z86018 Personal history of other benign neoplasm: Secondary | ICD-10-CM

## 2021-05-06 DIAGNOSIS — N979 Female infertility, unspecified: Secondary | ICD-10-CM

## 2021-05-06 DIAGNOSIS — R7303 Prediabetes: Secondary | ICD-10-CM | POA: Diagnosis not present

## 2021-05-06 NOTE — Patient Instructions (Signed)
WEIGHT LOSS MEDICATION OPTIONS  Phentermine-Topiramate extended release (Qsymia) is the most effective weight loss drug available to date. It combines an adrenergic agonist with a neurostabilizer. Adults with migraines and obesity are good candidates for this weight loss medication. This medication is a tablet, and the dose gets titrated up every few weeks.  Side effects include: abnormal sensations, dizziness, taste alterations, insomnia, constipation, and dry mouth. If more than 5% weight loss is not achieved after 12 weeks of the maximum dose, the weight loss pill should be gradually discontinued.  You previously tried Phentermine at a different dose, this one combines Phentermine with another medication to allow for longer use with slower but more manageable weight loss. You can be on this medication for > 1 year or longer.    Bupropion/Naltrexone (Contrave) combines a dopamine/norepinephrine reuptake inhibitor and an opioid receptor antagonist. This medication is a tablet, and the dose gets titrated up every few weeks. It controls cravings and addicted behaviors related to food. Side effects include: constipation, headaches, insomnia, and dry mouth.  You can be on this medication for > 1 year or longer.    Wegovy, Mounjaro, and Ozempic are newer medication, once-weekly injectable prescriptions. Works well for patient with weight-associated comorbidities such as diabetes or high cholesterol. Each contains semaglutide (GLP-1). Common side effects may include: nausea, diarrhea, vomiting, constipation, stomach (abdomen) pain, headache, tiredness (fatigue), upset stomach, dizziness, feeling bloated, belching, gas, stomach flu, and heartburn.  There is a high demand for this particular medication so may be a slight delay in receiving it. You can be on this medication for > 1 year or longer.   Lynn Boyd is an injection 3 mg is an injectable prescription medicine used once daily. Works well for patient with  weight-associated comorbidities such as diabetes or high cholesterol. Saxenda works like GLP-1 by regulating your appetite, which can lead to eating fewer calories and losing weight. The most common side effects of Saxenda in adults include nausea, diarrhea, constipation, vomiting, injection site reaction, low blood sugar (hypoglycemia), headache, tiredness (fatigue), dizziness, stomach pain, and change in enzyme (lipase) levels in your blood.

## 2021-05-06 NOTE — Progress Notes (Signed)
° ° °  GYNECOLOGY PROGRESS NOTE  Subjective:    Patient ID: Lynn Boyd, female    DOB: June 07, 1985, 36 y.o.   MRN: [id number]  HPI  Patient is a 36 y.o. G0P0000 female who presents for weight loss consult management. Has a history prediabetes. She has been prescribed Ozempic in the past for weight loss, but unable to get prescription filled as it was denied by insurance.   Patient also has questions regarding her fertility. 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   Notes having an HSG several years ago which showed blockage of tubes. Wonders if anything has changed now that the majority of her fibroids have been removed, and her endometriosis is more controlled with [place].    The following portions of the patient's history were reviewed and updated as appropriate: allergies, current medications, past family history, past medical history, past social history, past surgical history, and problem list.  Review of Systems Pertinent items noted in HPI and remainder of comprehensive ROS otherwise negative.   Objective:  Blood pressure (!) 135/93, pulse 69, resp. rate 16, height 5\' 2"  (1.575 m), weight 242 lb 11.2 oz (110.1 kg), last menstrual period 04/25/2021.  Body mass index is 44.39 kg/m. General appearance: alert, cooperative, and no distress Remainder of exam deferred.    Assessment:   1. Encounter for weight management   2. Endometriosis   3. History of uterine fibroid   4. Prediabetes   5. Female infertility      Plan:   Discussion had with patient regarding weight loss medication options. NOtes that she contacted her insurance company who gave her a list of covered meds. Will send through [place].  Endometriosis and abnormal bleeding due to fibroids managed with Freida Busman.  Prediabetes - will attempt to prescribe  medication for weight loss, will likely help prediabetes.  Female infertility, discussed that chromotubation was performed during most recent myomectomy, still noting blockage, however there may or may not be a slight change due to performance of a myomectomy. If HSG is desired, can schedule.  Patient also notes that she is going to be looking into egg freezing. Discussed adoption and surrogacy as well.   RTC in 1 month for weight loss visit, can be televisit.    Rubie Maid, MD [facility]

## 2021-05-07 MED ORDER — SEMAGLUTIDE-WEIGHT MANAGEMENT 1 MG/0.5ML ~~LOC~~ SOAJ: 1.0000 mg | SUBCUTANEOUS | 0 refills | Status: AC

## 2021-05-07 MED ORDER — SEMAGLUTIDE-WEIGHT MANAGEMENT 0.25 MG/0.5ML ~~LOC~~ SOAJ: 0.2500 mg | SUBCUTANEOUS | 0 refills | Status: AC

## 2021-05-07 MED ORDER — SEMAGLUTIDE-WEIGHT MANAGEMENT 2.4 MG/0.75ML ~~LOC~~ SOAJ: 2.4000 mg | SUBCUTANEOUS | 6 refills | Status: AC

## 2021-05-07 MED ORDER — SEMAGLUTIDE-WEIGHT MANAGEMENT 1.7 MG/0.75ML ~~LOC~~ SOAJ: 1.7000 mg | SUBCUTANEOUS | 0 refills | Status: AC

## 2021-05-07 MED ORDER — SEMAGLUTIDE-WEIGHT MANAGEMENT 0.5 MG/0.5ML ~~LOC~~ SOAJ: 0.5000 mg | SUBCUTANEOUS | 0 refills | Status: AC

## 2021-05-28 ENCOUNTER — Encounter: Payer: Self-pay | Admitting: Obstetrics and Gynecology

## 2021-05-28 ENCOUNTER — Other Ambulatory Visit: Payer: Self-pay | Admitting: Obstetrics and Gynecology

## 2021-06-04 ENCOUNTER — Telehealth (INDEPENDENT_AMBULATORY_CARE_PROVIDER_SITE_OTHER): Payer: BC Managed Care – PPO | Admitting: Obstetrics and Gynecology

## 2021-06-04 VITALS — Ht 62.0 in | Wt 238.0 lb

## 2021-06-04 DIAGNOSIS — R7303 Prediabetes: Secondary | ICD-10-CM | POA: Diagnosis not present

## 2021-06-04 DIAGNOSIS — N809 Endometriosis, unspecified: Secondary | ICD-10-CM | POA: Diagnosis not present

## 2021-06-04 DIAGNOSIS — Z6841 Body Mass Index (BMI) 40.0 and over, adult: Secondary | ICD-10-CM

## 2021-06-04 DIAGNOSIS — I10 Essential (primary) hypertension: Secondary | ICD-10-CM

## 2021-06-04 DIAGNOSIS — Z7689 Persons encountering health services in other specified circumstances: Secondary | ICD-10-CM

## 2021-06-04 NOTE — Progress Notes (Addendum)
? ? ?  GYNECOLOGY PROGRESS NOTE (VIDEO VISIT) ? ?I connected with  Lynn Boyd on 06/04/21 by a video enabled telemedicine application and verified that I am speaking with the correct person using two identifiers. ?  ?I discussed the limitations of evaluation and management by telemedicine. The patient expressed understanding and agreed to proceed.  ? ? ?Location:  ?Patient: Lynn Boyd ?Provider: Office ? ?HPI ? Patient is a 36 y.o. female who presents for 1 month weight management follow up. She has a past history of  PCOS, obesity, pre-diabetes, hypertension. She initiated use of Wegovy 1 months ago.  Denies any undesirable side effects and reports compliance with medications. She has lost about 4-5 lbs. She really like the [facility]. She does report recently experiencing some nausea but otherwise ok.  ?  ?Current interventions:  ?1. Diet - She eats about 2 meals and a snack daily. She drinks about 4 bottles of water. Has lost the taste of soda.   ?2. Activity - She is not that active. ?3. Reports bowel movements are normal.  ? ?Patient also has questions regarding her Freida Busman medication as she is getting near the end of her 2 year prescription.  ? ? ?The following portions of the patient's history were reviewed and updated as appropriate: allergies, current medications, past family history, past medical history, past social history, past surgical history, and problem list. ? ?Review of Systems ?Pertinent items noted in HPI and remainder of comprehensive ROS otherwise negative.  ? ?Objective:  ?  ? ?  06/04/2021  ?  4:01 PM 05/06/2021  ?  3:37 PM 12/20/2020  ?  2:30 PM  ?Vitals with BMI  ?Height '5\' 2"'$  '5\' 2"'$  '5\' 2"'$   ?Weight 238 lbs 242 lbs 11 oz 238 lbs 5 oz  ?BMI 43.52 44.38 43.57  ?Systolic  549 826  ?Diastolic  93 81  ?Pulse  69 74  ? ? ?General appearance: alert and no distress ?Abdomen: soft, non-tender.  Waist circumference not measured.  ? ? ?Labs:  ?No new labs ? ?Assessment:  ? ?Weight management ?Obesity, Body mass  index is 43.53 kg/m?Marland Kitchen ?Prediabetes ?PCOS ?Endometriosis  ? ?Plan:  ? ?Weight management  - doing well with weight loss, can continue current management.  Will follow up again in in 3 months.   ?Endometriosis - Advised that after 2 years, patient will need to take a hiatus.  If symptoms return, may be able to resume after 3 months.  ? ? ? ?Approximately 8 minutes of non face-to-face time was spent during this encounter.  ? ?Rubie Maid, MD ?[facility]  ?

## 2021-06-05 ENCOUNTER — Encounter: Payer: Self-pay | Admitting: Obstetrics and Gynecology

## 2021-06-05 NOTE — Patient Instructions (Signed)
Preventing Unhealthy Weight Gain, Adult  Staying at a healthy weight is important to your overall health. When fat builds up in your body, you may become overweight or obese. Being overweight or obese increases your risk of developing various health problems.  Unhealthy weight gain is often the result of making unhealthy food choices or not getting enough exercise. You can make changes to your lifestyle to prevent obesity and stay as healthy as possible.  How can unhealthy weight gain affect me?  Being overweight or obese can cause you to develop joint or bone problems, which can make it hard for you to stay active or do activities you enjoy. Being overweight also puts stress on your heart and lungs and can lead to health problems such as:  Diabetes.  Heart disease.  Some types of cancer.  Stroke.  Eating healthy, staying active, and having healthy habits can help to prevent unhealthy weight gain and lower your risk for health problems. These lifestyle changes will also help you manage stress and emotions, improve your self-esteem, and connect with friends and family.  What can increase my risk?  In addition to certain eating and lifestyle choices, some other factors that may make you more likely to have unhealthy weight gain include:  Having a family history of obesity.  Living in an area with limited access to:  Parks, recreation centers, or sidewalks.  Healthy food choices, such as grocery stores and farmers' markets.  What actions can I take to prevent unhealthy weight gain?  Nutrition    Eat only as much as your body needs. To do this:  Pay attention to signs that you are hungry or full. Stop eating as soon as you feel full.  If you feel hungry, try drinking water first before eating. Drink enough water so your urine is pale yellow.  Eat smaller portions. Pay attention to portion sizes when eating out.  Look at serving sizes on food labels. Most foods contain more than one serving per container.  Eat the  recommended number of calories for your gender and activity level. For most active people, a daily total of 2,000 calories is appropriate. If you are trying to lose weight or are not very active, you may need to eat fewer calories. Talk with your health care provider or a dietitian about how many calories you need each day.  Choose healthy foods, such as:  Fruits and vegetables. At each meal, try to fill at least half of your plate with fruits and vegetables.  Whole grains, such as whole-wheat bread, brown rice, and quinoa.  Lean meats, such as chicken or fish.  Other healthy proteins, such as beans, eggs, or tofu.  Healthy fats, such as nuts, seeds, fatty fish, and olive oil.  Low-fat or fat-free dairy products.  Check food labels, and avoid food and drinks that:  Are high in calories.  Have added sugar.  Are high in sodium.  Have saturated fats or trans fats.  Cook foods in healthier ways, such as by baking, broiling, or grilling.  Make a meal plan for the week, and shop with a grocery list to help you stay on track with your purchases. Try to avoid going to the grocery store when you are hungry.  When grocery shopping, try to shop around the outside of the store first, where the fresh foods are. Doing this helps you avoid prepackaged foods, which can be high in sugar, salt (sodium), and fat.  Lifestyle    Exercise   that strengthen the muscles, such as lifting weights or using resistance bands, on 2 or more days a week. ?Do not use any products that contain nicotine or tobacco. These products include cigarettes, chewing tobacco, and vaping devices, such as e-cigarettes. If you need help quitting, ask your health care provider. ?If you drink alcohol: ?Limit how much you  have to: ?0-1 drink a day for women who are not pregnant. ?0-2 drinks a day for men. ?Know how much alcohol is in a drink. In the [place] one drink equals one 12 oz bottle of beer (355 mL), one 5 oz glass of wine (148 mL), or one 1? oz glass of hard liquor (44 mL). ?Try to get 7-9 hours of sleep each night. ?Other changes ?Keep a food and activity journal to keep track of: ?What you ate and how many calories you had. Remember to count the calories in sauces, dressings, and side dishes. ?Whether you were active, and what exercises you did. ?Your calorie, weight, and activity goals. ?Check your weight regularly. Track any changes. If you notice that you have gained weight, make changes to your diet or activity routine. ?Avoid taking weight-loss medicines or supplements. Talk to your health care provider before starting any new medicine or supplement. ?Talk to your health care provider before trying any new diet or exercise plan. ?Where to find more information ?Talk with your health care provider or a dietitian about healthy eating and healthy lifestyle choices. You may also find information from: ?[place] Department of Agriculture, MyPlate: FormerBoss.no ?American Heart Association: www.heart.org ?Centers for Disease Control and Prevention: http://www.wolf.info/ ?Summary ?Eating healthy, staying active, and having healthy habits can help to prevent unhealthy weight gain and lower your risk for health problems such as heart disease, diabetes, some types of cancer, and stroke. ?Being overweight or obese can cause you to develop joint or bone problems, which can make it hard for you to stay active or do activities you enjoy. ?You can prevent unhealthy weight gain by eating a healthy diet, exercising regularly, not smoking, limiting alcohol, and getting enough sleep. ?Talk with your health care provider or a dietitian for guidance about healthy eating and healthy lifestyle choices. ?This information is not intended to replace  advice given to you by your health care provider. Make sure you discuss any questions you have with your health care provider. ?Document Revised: 09/20/2020 Document Reviewed: 09/20/2020 ?Elsevier Patient Education ? [place]. ? ?

## 2021-07-02 ENCOUNTER — Other Ambulatory Visit: Payer: Self-pay | Admitting: Obstetrics and Gynecology

## 2021-07-04 ENCOUNTER — Telehealth: Payer: Self-pay | Admitting: Obstetrics and Gynecology

## 2021-07-04 NOTE — Telephone Encounter (Signed)
Patient called and states she need a refill on her RX Or[place]issa. Please advise.  ?

## 2021-07-22 ENCOUNTER — Encounter: Payer: Self-pay | Admitting: Obstetrics and Gynecology

## 2021-09-12 ENCOUNTER — Encounter: Payer: BC Managed Care – PPO | Admitting: Obstetrics and Gynecology

## 2021-09-18 ENCOUNTER — Encounter: Payer: Self-pay | Admitting: Obstetrics and Gynecology

## 2021-09-28 ENCOUNTER — Encounter: Payer: Self-pay | Admitting: Obstetrics and Gynecology

## 2021-09-28 DIAGNOSIS — N809 Endometriosis, unspecified: Secondary | ICD-10-CM

## 2021-09-28 DIAGNOSIS — N938 Other specified abnormal uterine and vaginal bleeding: Secondary | ICD-10-CM

## 2021-09-29 ENCOUNTER — Telehealth: Payer: Self-pay | Admitting: Obstetrics and Gynecology

## 2021-09-29 NOTE — Telephone Encounter (Signed)
Pt states "the pharmacy is waiting for prior authorization" -  before they can refill the RX for [place] - and [place] pt uses [facility] on [address].  Pls return call.

## 2021-10-03 NOTE — Telephone Encounter (Signed)
Prior authorization has been started for the [facility]. Patient was informed that she will have to stop taking Orlissa for about 3-4 months and then restart it.

## 2021-10-13 ENCOUNTER — Telehealth: Payer: Self-pay | Admitting: Obstetrics and Gynecology

## 2021-10-13 NOTE — Telephone Encounter (Signed)
Pt called stating that she is having trouble getting her medication "wegovy" her pharmacy states that it needs a prior auth. Confirmed pharmacy as [facility] on [address].

## 2021-10-17 NOTE — Telephone Encounter (Signed)
Patient called and states she had a missed call. Informed patient of the mychart message. Verbally read message aloud to patient. Patient verbalized understanding and states that she will respond to mychart message with her new weight.

## 2021-10-17 NOTE — Telephone Encounter (Signed)
I am working on a peer to peer for this patient. I have sent patient a mychart message asking her to send me her most current wait. As soon as I get this information from the patient. Peer to peer will be complete and hopefully we will have an answer.

## 2021-10-21 NOTE — Telephone Encounter (Signed)
Please inform that prescription has been approved.

## 2021-10-30 ENCOUNTER — Encounter: Payer: Self-pay | Admitting: Emergency Medicine

## 2021-10-30 ENCOUNTER — Other Ambulatory Visit: Payer: Self-pay

## 2021-10-30 ENCOUNTER — Emergency Department
Admission: EM | Admit: 2021-10-30 | Discharge: 2021-10-30 | Disposition: A | Discharge status: Home / Self Care | Payer: BC Managed Care – PPO | Attending: Emergency Medicine | Admitting: Emergency Medicine

## 2021-10-30 ENCOUNTER — Emergency Department: Payer: BC Managed Care – PPO

## 2021-10-30 DIAGNOSIS — N1330 Unspecified hydronephrosis: Secondary | ICD-10-CM | POA: Insufficient documentation

## 2021-10-30 DIAGNOSIS — R109 Unspecified abdominal pain: Secondary | ICD-10-CM

## 2021-10-30 LAB — URINALYSIS, ROUTINE W REFLEX MICROSCOPIC
Bacteria, UA: NONE SEEN
Bilirubin Urine: NEGATIVE
Glucose, UA: NEGATIVE mg/dL
Ketones, ur: 5 mg/dL — AB
Leukocytes,Ua: NEGATIVE
Nitrite: NEGATIVE
Protein, ur: NEGATIVE mg/dL
Specific Gravity, Urine: 1.016 (ref 1.005–1.030)
pH: 6 (ref 5.0–8.0)

## 2021-10-30 LAB — COMPREHENSIVE METABOLIC PANEL
ALT: 9 U/L (ref 0–44)
AST: 15 U/L (ref 15–41)
Albumin: 4.4 g/dL (ref 3.5–5.0)
Alkaline Phosphatase: 61 U/L (ref 38–126)
Anion gap: 9 (ref 5–15)
BUN: 14 mg/dL (ref 6–20)
CO2: 25 mmol/L (ref 22–32)
Calcium: 9.6 mg/dL (ref 8.9–10.3)
Chloride: 104 mmol/L (ref 98–111)
Creatinine, Ser: 1.19 mg/dL — ABNORMAL HIGH (ref 0.44–1.00)
GFR, Estimated: >60 mL/min (ref >60)
Glucose, Bld: 91 mg/dL (ref 70–99)
Potassium: 3.4 mmol/L — ABNORMAL LOW (ref 3.5–5.1)
Sodium: 138 mmol/L (ref 135–145)
Total Bilirubin: 0.4 mg/dL (ref 0.3–1.2)
Total Protein: 8.4 g/dL — ABNORMAL HIGH (ref 6.5–8.1)

## 2021-10-30 LAB — CBC WITH DIFFERENTIAL/PLATELET
Abs Immature Granulocytes: 0 10*3/uL (ref 0.00–0.07)
Basophils Absolute: 0 10*3/uL (ref 0.0–0.1)
Basophils Relative: 0 %
Eosinophils Absolute: 0.1 10*3/uL (ref 0.0–0.5)
Eosinophils Relative: 2 %
HCT: 40.6 % (ref 36.0–46.0)
Hemoglobin: 12.6 g/dL (ref 12.0–15.0)
Immature Granulocytes: 0 %
Lymphocytes Relative: 28 %
Lymphs Abs: 1.4 10*3/uL (ref 0.7–4.0)
MCH: 25.5 pg — ABNORMAL LOW (ref 26.0–34.0)
MCHC: 31 g/dL (ref 30.0–36.0)
MCV: 82.2 fL (ref 80.0–100.0)
Monocytes Absolute: 0.4 10*3/uL (ref 0.1–1.0)
Monocytes Relative: 8 %
Neutro Abs: 3.3 10*3/uL (ref 1.7–7.7)
Neutrophils Relative %: 62 %
Platelets: 242 10*3/uL (ref 150–400)
RBC: 4.94 MIL/uL (ref 3.87–5.11)
RDW: 14.7 % (ref 11.5–15.5)
WBC: 5.2 10*3/uL (ref 4.0–10.5)
nRBC: 0 % (ref 0.0–0.2)

## 2021-10-30 LAB — PREGNANCY, URINE: Preg Test, Ur: NEGATIVE

## 2021-10-30 LAB — POC URINE PREG, ED
Preg Test, Ur: NEGATIVE
Preg Test, Ur: NEGATIVE

## 2021-10-30 MED ORDER — TAMSULOSIN HCL 0.4 MG PO CAPS: 0.4000 mg | ORAL_CAPSULE | Freq: Every day | ORAL | 0 refills | Status: DC

## 2021-10-30 MED ORDER — KETOROLAC TROMETHAMINE 30 MG/ML IJ SOLN
30.0000 mg | Freq: Once | INTRAMUSCULAR | Status: AC
  Administered 2021-10-30: 30 mg via INTRAMUSCULAR
  Filled 2021-10-30: qty 1

## 2021-10-30 MED ORDER — KETOROLAC TROMETHAMINE 10 MG PO TABS: 10.0000 mg | ORAL_TABLET | Freq: Four times a day (QID) | ORAL | 0 refills | Status: AC

## 2021-10-30 MED ORDER — ONDANSETRON 4 MG PO TBDP: 4.0000 mg | ORAL_TABLET | Freq: Three times a day (TID) | ORAL | 0 refills | Status: DC | PRN

## 2021-10-30 MED ORDER — ONDANSETRON 8 MG PO TBDP
8.0000 mg | ORAL_TABLET | Freq: Once | ORAL | Status: AC
  Administered 2021-10-30: 8 mg via ORAL
  Filled 2021-10-30: qty 1

## 2021-10-30 MED ORDER — MORPHINE SULFATE (PF) 4 MG/ML IV SOLN
4.0000 mg | Freq: Once | INTRAVENOUS | Status: AC
  Administered 2021-10-30: 4 mg via INTRAMUSCULAR
  Filled 2021-10-30: qty 1

## 2021-10-30 MED ORDER — OXYCODONE-ACETAMINOPHEN 5-325 MG PO TABS
1.0000 | ORAL_TABLET | Freq: Once | ORAL | Status: AC
  Administered 2021-10-30: 1 via ORAL
  Filled 2021-10-30: qty 1

## 2021-10-30 MED ORDER — NORETHINDRONE ACETATE 5 MG PO TABS: 5.0000 mg | ORAL_TABLET | Freq: Every day | ORAL | 11 refills | Status: DC

## 2021-10-30 MED ORDER — HYDROCODONE-ACETAMINOPHEN 5-325 MG PO TABS: 1.0000 | ORAL_TABLET | ORAL | 0 refills | Status: DC | PRN

## 2021-10-30 NOTE — ED Provider Notes (Signed)
[facility] Provider Note  Patient Contact: 6:12 PM (approximate)   History   Flank Pain   HPI  Lynn Boyd is a 36 y.o. female who presents the emergency department complaining of right flank pain pain.  She states that the pain began last night, has been constant.  Does not seem to be worse with movement though she does endorse that sitting has typically worsen the pain.  Patient with no history of kidney stones.  She has no abdominal complaints to include abdominal pain, vomiting, diarrhea.  No urinary changes.  No history of nephrolithiasis.     Physical Exam   Triage Vital Signs: ED Triage Vitals  Enc Vitals Group     BP 10/30/21 1613 (!) 170/117     Pulse Rate 10/30/21 1613 81     Resp 10/30/21 1613 17     Temp 10/30/21 1613 98.2 F (36.8 C)     Temp Source 10/30/21 1613 Oral     SpO2 10/30/21 1613 95 %     Weight 10/30/21 1751 238 lb 1.6 oz (108 kg)     Height 10/30/21 1751 '5\' 2"'$  (1.575 m)     Head Circumference --      Peak Flow --      Pain Score 10/30/21 1615 7     Pain Loc --      Pain Edu? --      Excl. in [place]? --     Most recent vital signs: Vitals:   10/30/21 1613 10/30/21 2005  BP: (!) 170/117 (!) 177/118  Pulse: 81 86  Resp: 17 18  Temp: 98.2 F (36.8 C) 98.4 F (36.9 C)  SpO2: 95% 99%     General: Alert and in no acute distress. Cardiovascular:  Good peripheral perfusion Respiratory: Normal respiratory effort without tachypnea or retractions. Lungs CTAB. Good air entry to the bases with no decreased or absent breath sounds. Gastrointestinal: Bowel sounds 4 quadrants. Soft and nontender to palpation. No guarding or rigidity. No palpable masses. No distention. No CVA tenderness. Musculoskeletal: Full range of motion to all extremities.  Neurologic:  No gross focal neurologic deficits are appreciated.  Skin:   No rash noted Other:   ED Results / Procedures / Treatments   Labs (all labs ordered are listed, but  only abnormal results are displayed) Labs Reviewed  CBC WITH DIFFERENTIAL/PLATELET - Abnormal; Notable for the following components:      Result Value   MCH 25.5 (*)    All other components within normal limits  COMPREHENSIVE METABOLIC PANEL - Abnormal; Notable for the following components:   Potassium 3.4 (*)    Creatinine, Ser 1.19 (*)    Total Protein 8.4 (*)    All other components within normal limits  URINALYSIS, ROUTINE W REFLEX MICROSCOPIC - Abnormal; Notable for the following components:   Color, Urine YELLOW (*)    APPearance HAZY (*)    Hgb urine dipstick SMALL (*)    Ketones, ur 5 (*)    All other components within normal limits  PREGNANCY, URINE  POC URINE PREG, ED     EKG     RADIOLOGY  I personally viewed, evaluated, and interpreted these images as part of my medical decision making, as well as reviewing the written report by the radiologist.  ED Provider Interpretation: Signs of hydronephrosis/ureteral obstruction to the right ureter.  There is no corresponding stone.  There is a cystic structure adjacent but it does not look like there  is extrinsic compression of the ureter.  I discussed this finding with Dr. Brendolyn Patty believes that this is likely from a recently passed kidney stone.  CT Renal Stone Study  Result Date: 10/30/2021 CLINICAL DATA:  Flank pain, kidney stone suspected. Right flank pain since last night. History of kidney stones. EXAM: CT ABDOMEN AND PELVIS WITHOUT CONTRAST TECHNIQUE: Multidetector CT imaging of the abdomen and pelvis was performed following the standard protocol without IV contrast. RADIATION DOSE REDUCTION: This exam was performed according to the departmental dose-optimization program which includes automated exposure control, adjustment of the mA and/or kV according to patient size and/or use of iterative reconstruction technique. COMPARISON:  Abdominopelvic CT 06/12/2019. FINDINGS: Lower chest: Trace bilateral pleural effusions  with minimal dependent atelectasis at both lung bases. Hepatobiliary: The liver appears unremarkable as imaged in the noncontrast state. Incomplete gallbladder distention. No evidence of gallstones, gallbladder wall thickening or biliary dilatation. Pancreas: Unremarkable. No pancreatic ductal dilatation or surrounding inflammatory changes. Spleen: Normal in size without focal abnormality. Adrenals/Urinary Tract: Both adrenal glands appear normal. Punctate nonobstructing bilateral renal calculi (2 on the right, 1 on the left). Moderate right-sided hydronephrosis and hydroureter without evidence of ureteral or bladder calculus. No left-sided hydronephrosis. The bladder appears grossly unremarkable for its degree of distention. Stomach/Bowel: No enteric contrast administered. The stomach appears unremarkable for its degree of distension. No evidence of bowel wall thickening, distention or surrounding inflammatory change. The appendix appears normal. Vascular/Lymphatic: There are no enlarged abdominal or pelvic lymph nodes. No significant vascular findings on noncontrast imaging. Reproductive: Heterogeneous enlargement of the uterus by multiple fibroids, similar to previous study. There is a submucosal component which measures approximately 3.6 cm on sagittal image 105/6, also similar to the prior examination. Large mid right abdominal mass present on the previous CT has been surgically excised in the interval (leiomyoma on pathology). Posterior right adnexal cystic appearing structure measures 5.9 x 3.8 cm and 1 HU on image 63/2. This is in close proximity to the distal right ureter. No suspicious left adnexal findings. Loculated peritoneal fluid anteriorly in the pelvis is similar to previous study. Other: Apparent interval surgical repair of the previous midline and right-sided abdominal hernias. There is a small recurrent midline hernia containing only fat. No generalized ascites or free air. Musculoskeletal: No  acute or significant osseous findings. IMPRESSION: 1. Signs of distal right ureteral obstruction without demonstrated etiology. Findings could be secondary to a recently passed ureteral calculus. Extrinsic compression of the ureter by a predominately cystic posterior right adnexal process cannot be excluded. This adnexal process does not appear particularly worrisome on noncontrast CT, except for the adjacent ureteral obstruction. Suggest outpatient urology follow-up. Management options include follow-up CT with contrast and pelvic ultrasound. 2. Punctate nonobstructing bilateral renal calculi. 3. Interval surgical excision of a previously demonstrated mass in the mid right abdomen (leiomyoma on pathology). Multiple uterine fibroids are similar to previous examination. 4. Interval ventral hernia repair with small recurrent supraumbilical component. Electronically Signed   By: Richardean Sale M.D.   On: 10/30/2021 19:46    PROCEDURES:  Critical Care performed: No  Procedures   MEDICATIONS ORDERED IN ED: Medications  ketorolac (TORADOL) 30 MG/ML injection 30 mg (30 mg Intramuscular Given 10/30/21 2051)  morphine (PF) 4 MG/ML injection 4 mg (4 mg Intramuscular Given 10/30/21 2052)  ondansetron (ZOFRAN-ODT) disintegrating tablet 8 mg (8 mg Oral Given 10/30/21 2051)     IMPRESSION / MDM / ASSESSMENT AND PLAN / ED COURSE  I reviewed the triage vital  signs and the nursing notes.                              Differential diagnosis includes, but is not limited to, nephrolithiasis, pyelonephritis, hydroureter, UTI, lumbar strain  Patient's presentation is most consistent with acute presentation with potential threat to life or bodily function.   Patient's diagnosis is consistent with flank pain, hydronephrosis.  Patient presented to the ED with sudden onset of flank pain last night.  Symptoms were consistent with nephrolithiasis.  She had a small amount of hemoglobin but no evidence of infection on  urinalysis.  Labs are otherwise reassuring.  Patient had CT scan for evaluation which reveals hydronephrosis to the right side with possible right ureter obstruction.  There is a cystic structure near this area though there does not appear to be extrinsic compression on the CT.  I discussed the patient with on-call urologist who advises they believe that this was likely recently passed kidney stone.  This definitely matches the patient's presentation.  As such we will treat with pain medication, anti-inflammatory and Flomax.  Patient will follow-up with urology to ensure resolution of symptoms.  If patient has any ongoing symptoms urology will be able to further manage cystic structure near the ureter..  Return precautions discussed with the patient.  Patient is given ED precautions to return to the ED for any worsening or new symptoms.        FINAL CLINICAL IMPRESSION(S) / ED DIAGNOSES   Final diagnoses:  Flank pain  Hydronephrosis, unspecified hydronephrosis type     Rx / DC Orders   ED Discharge Orders          Ordered    ketorolac (TORADOL) 10 MG tablet  Every 6 hours        10/30/21 2047    HYDROcodone-acetaminophen (NORCO/VICODIN) 5-325 MG tablet  Every 4 hours PRN        10/30/21 2047    ondansetron (ZOFRAN-ODT) 4 MG disintegrating tablet  Every 8 hours PRN        10/30/21 2047    tamsulosin (FLOMAX) 0.4 MG CAPS capsule  Daily        10/30/21 2047             Note:  This document was prepared using Dragon voice recognition software and may include unintentional dictation errors.   Brynda Peon 10/30/21 2119    Naaman Plummer, MD 10/31/21 [phone]

## 2021-10-30 NOTE — ED Triage Notes (Signed)
Pt presents to ED with c/o of R sided flank pain. Pt's states pain started last night at 10pm. Pt denies HX of kidney stones. Pt denies fever or chills. Pt does endorse some nausea but no vomiting.  Pt states HX of uterine fibroids.

## 2021-10-30 NOTE — ED Notes (Signed)
6 yof with a c/c of right sided flank pain since approx.10 pm last night.

## 2021-10-31 ENCOUNTER — Telehealth: Payer: Self-pay | Admitting: Urology

## 2021-10-31 NOTE — Telephone Encounter (Signed)
Please advise if RUS is needed.

## 2021-10-31 NOTE — Telephone Encounter (Signed)
Pt has appt 9/6 at 3 w/[facility] in [place] and would like to have RUS done in [place] also.  I didn't see where it has been ordered yet.  He sent me a staff message.

## 2021-11-03 ENCOUNTER — Other Ambulatory Visit: Payer: Self-pay | Admitting: Urology

## 2021-11-03 DIAGNOSIS — N1339 Other hydronephrosis: Secondary | ICD-10-CM

## 2021-11-03 NOTE — Progress Notes (Unsigned)
GYNECOLOGY ANNUAL PHYSICAL EXAM PROGRESS NOTE  Subjective:    Lynn Boyd is a 36 y.o. G0P0000 female who presents for an annual exam. She has a past medical history significant for HTN, fibroid uterus, menorrhagia, anemia.  The patient is sexually active. The patient wears seatbelts: yes. The patient participates in regular exercise: yes. Has the patient ever been transfused or tattooed?: no. The patient reports that there is not domestic violence in her life.   The patient has no complaints today.   Menstrual History: Menarche age: 16 No LMP recorded.     Gynecologic History:  Contraception: none History of STI's:  HSV. No recent outbreaks. Last Pap: 09/12/2019. Results were: normal.  Notes h/o abnormal pap smears.  OB History  Gravida Para Term Preterm AB Living  0 0 0 0 0 0  SAB IAB Ectopic Multiple Live Births  0 0 0 0 0    Past Medical History:  Diagnosis Date   Anxiety    Cardiomyopathy ([place])    HYPERTROPHIC   Chronic blood loss anemia    iron infusions   Depression    Endometriosis    Enlarged heart    Fibroid uterus    Heart murmur    Herpes simplex virus (HSV) infection    History of blood transfusion 2016   2 units, given for chronic blood loss anemia    Hx MRSA infection    wound that required a drain   Hypertension    Sleep apnea    OSA, not currently using CPAP   TOA (tubo-ovarian abscess) 11/28/2014   Vaginal Pap smear, abnormal 2017   ASCUS with HPV+    Past Surgical History:  Procedure Laterality Date   endometreoisis     surgery   INCISIONAL HERNIA REPAIR  09/07/2019   Procedure: ROBOTIC ASSISTED INCISIONAL HERNIA x5;  Surgeon: Olean Ree, MD;  Location: [facility];  Service: General;;   IR IMAGE GUIDED DRAINAGE BY PERCUTANEOUS CATHETER  2016   left tubo-ovarian abscess, performed at [place]  09/07/2019   Procedure: ROBOTIC ASSISTED EXCISION OF RIGHT UPPER QUADRANT  MASS;  Surgeon: Rubie Maid, MD;  Location:  [facility];  Service: Gynecology;;   Kristine Royal ASSISTED MYOMECTOMY  05/2010   [facility]   ROBOT ASSISTED MYOMECTOMY N/A 08/24/2018   Procedure: ROBOTIC ASSISTED MYOMECTOMY;  Surgeon: Rubie Maid, MD;  Location: [facility];  Service: Gynecology;  Laterality: N/A;   ROBOTIC ASSISTED LAPAROSCOPIC LYSIS OF ADHESION N/A 08/24/2018   Procedure: ROBOTIC ASSISTED LAPAROSCOPIC LYSIS OF ADHESION;  Surgeon: Rubie Maid, MD;  Location: [facility];  Service: Gynecology;  Laterality: N/A;    Family History  Problem Relation Age of Onset   Hypertension Mother    Diabetes Mother    Hypertension Father     Social History   Socioeconomic History   Marital status: Single    Spouse name: Not on file   Number of children: Not on file   Years of education: Not on file   Highest education level: Not on file  Occupational History   Occupation: Education officer, museum    Employer: [employer]  Tobacco Use   Smoking status: Never   Smokeless tobacco: Never  Vaping Use   Vaping Use: Never used  Substance and Sexual Activity   Alcohol use: Yes    Alcohol/week: 0.0 standard drinks of alcohol    Comment: socially and occa   Drug use: No   Sexual activity: Yes    Birth  control/protection: None  Other Topics Concern   Not on file  Social History Narrative   Not on file   Social Determinants of Health   Financial Resource Strain: Not on file  Food Insecurity: Not on file  Transportation Needs: Not on file  Physical Activity: Not on file  Stress: Not on file  Social Connections: Not on file  Intimate Partner Violence: Not on file    Current Outpatient Medications on File Prior to Visit  Medication Sig Dispense Refill   acetaminophen (TYLENOL) 325 MG tablet Take 650 mg by mouth every 6 (six) hours as needed.      adapalene (DIFFERIN) 0.1 % gel Apply 1 application topically every other day.     cholecalciferol (VITAMIN D3) 25 MCG (1000 UT) tablet Take 1,000 Units by mouth daily.      clindamycin (CLINDAGEL) 1 % gel  Apply 1 application topically daily.      HYDROcodone-acetaminophen (NORCO/VICODIN) 5-325 MG tablet Take 1 tablet by mouth every 4 (four) hours as needed for moderate pain. 20 tablet 0   ketorolac (TORADOL) 10 MG tablet Take 1 tablet (10 mg total) by mouth every 6 (six) hours for 5 days. 20 tablet 0   metoprolol succinate (TOPROL-XL) 100 MG 24 hr tablet Take 100 mg by mouth daily.     naproxen sodium (ALEVE) 220 MG tablet Take 220 mg by mouth.     norethindrone (AYGESTIN) 5 MG tablet Take 1 tablet (5 mg total) by mouth daily. 30 tablet 11   ondansetron (ZOFRAN-ODT) 4 MG disintegrating tablet Take 1 tablet (4 mg total) by mouth every 8 (eight) hours as needed for nausea or vomiting. 20 tablet 0   ORILISSA 150 MG TABS Take 1 tablet by mouth once daily 30 tablet 6   spironolactone (ALDACTONE) 50 MG tablet Take 1 tablet by mouth daily.     tamsulosin (FLOMAX) 0.4 MG CAPS capsule Take 1 capsule (0.4 mg total) by mouth daily. 14 capsule 0   tretinoin (RETIN-A) 0.025 % cream Apply 1 application topically at bedtime.      valACYclovir (VALTREX) 500 MG tablet Take 1 tablet by mouth daily as needed.     No current facility-administered medications on file prior to visit.    Allergies  Allergen Reactions   Bactrim [Sulfamethoxazole-Trimethoprim] Swelling   Latex Rash     Review of Systems Constitutional: negative for chills, fatigue, fevers and sweats Eyes: negative for irritation, redness and visual disturbance Ears, nose, mouth, throat, and face: negative for hearing loss, nasal congestion, snoring and tinnitus Respiratory: negative for asthma, cough, sputum Cardiovascular: negative for chest pain, dyspnea, exertional chest pressure/discomfort, irregular heart beat, palpitations and syncope Gastrointestinal: negative for abdominal pain, change in bowel habits, nausea and vomiting Genitourinary: negative for abnormal menstrual periods, genital lesions, sexual problems and vaginal discharge,  dysuria and urinary incontinence Integument/breast: negative for breast lump, breast tenderness and nipple discharge Hematologic/lymphatic: negative for bleeding and easy bruising Musculoskeletal:negative for back pain and muscle weakness Neurological: negative for dizziness, headaches, vertigo and weakness Endocrine: negative for diabetic symptoms including polydipsia, polyuria and skin dryness Allergic/Immunologic: negative for hay fever and urticaria      Objective:  There were no vitals taken for this visit. There is no height or weight on file to calculate BMI.    General Appearance:    Alert, cooperative, no distress, appears stated age  Head:    Normocephalic, without obvious abnormality, atraumatic  Eyes:    PERRL, conjunctiva/corneas clear, EOM's intact, both  eyes  Ears:    Normal external ear canals, both ears  Nose:   Nares normal, septum midline, mucosa normal, no drainage or sinus tenderness  Throat:   Lips, mucosa, and tongue normal; teeth and gums normal  Neck:   Supple, symmetrical, trachea midline, no adenopathy; thyroid: no enlargement/tenderness/nodules; no carotid bruit or JVD  Back:     Symmetric, no curvature, ROM normal, no CVA tenderness  Lungs:     Clear to auscultation bilaterally, respirations unlabored  Chest Wall:    No tenderness or deformity   Heart:    Regular rate and rhythm, S1 and S2 normal, no murmur, rub or gallop  Breast Exam:    No tenderness, masses, or nipple abnormality  Abdomen:     Soft, non-tender, bowel sounds active all four quadrants, no masses, no organomegaly.    Genitalia:    Pelvic:external genitalia normal, vagina without lesions, discharge, or tenderness, rectovaginal septum  normal. Cervix normal in appearance, no cervical motion tenderness, no adnexal masses or tenderness.  Uterus normal size, shape, mobile, regular contours, nontender.  Rectal:    Normal external sphincter.  No hemorrhoids appreciated. Internal exam not done.    Extremities:   Extremities normal, atraumatic, no cyanosis or edema  Pulses:   2+ and symmetric all extremities  Skin:   Skin color, texture, turgor normal, no rashes or lesions  Lymph nodes:   Cervical, supraclavicular, and axillary nodes normal  Neurologic:   CNII-XII intact, normal strength, sensation and reflexes throughout   .  Labs:  Lab Results  Component Value Date   WBC 5.2 10/30/2021   HGB 12.6 10/30/2021   HCT 40.6 10/30/2021   MCV 82.2 10/30/2021   PLT 242 10/30/2021    Lab Results  Component Value Date   CREATININE 1.19 (H) 10/30/2021   BUN 14 10/30/2021   NA 138 10/30/2021   K 3.4 (L) 10/30/2021   CL 104 10/30/2021   CO2 25 10/30/2021    Lab Results  Component Value Date   ALT 9 10/30/2021   AST 15 10/30/2021   ALKPHOS 61 10/30/2021   BILITOT 0.4 10/30/2021    Lab Results  Component Value Date   TSH 3.860 09/11/2020     Assessment:   No diagnosis found.   Plan:  Blood tests: {blood tests:13147}. Breast self exam technique reviewed and patient encouraged to perform self-exam monthly. Contraception: none. Discussed healthy lifestyle modifications. Mammogram  : Not age appropriate Pap smear ordered. COVID vaccination status: Follow up in 1 year for annual exam   Rubie Maid, MD [facility]

## 2021-11-04 ENCOUNTER — Encounter: Payer: Self-pay | Admitting: Obstetrics and Gynecology

## 2021-11-04 ENCOUNTER — Ambulatory Visit (INDEPENDENT_AMBULATORY_CARE_PROVIDER_SITE_OTHER): Payer: BC Managed Care – PPO | Admitting: Obstetrics and Gynecology

## 2021-11-04 VITALS — BP 125/93 | HR 88 | Ht 62.0 in | Wt 207.5 lb

## 2021-11-04 DIAGNOSIS — Z86018 Personal history of other benign neoplasm: Secondary | ICD-10-CM

## 2021-11-04 DIAGNOSIS — Z01419 Encounter for gynecological examination (general) (routine) without abnormal findings: Secondary | ICD-10-CM | POA: Diagnosis not present

## 2021-11-04 DIAGNOSIS — R7303 Prediabetes: Secondary | ICD-10-CM

## 2021-11-04 DIAGNOSIS — E785 Hyperlipidemia, unspecified: Secondary | ICD-10-CM

## 2021-11-04 DIAGNOSIS — Z8742 Personal history of other diseases of the female genital tract: Secondary | ICD-10-CM

## 2021-11-04 DIAGNOSIS — E669 Obesity, unspecified: Secondary | ICD-10-CM

## 2021-11-04 DIAGNOSIS — Z131 Encounter for screening for diabetes mellitus: Secondary | ICD-10-CM

## 2021-11-04 DIAGNOSIS — N938 Other specified abnormal uterine and vaginal bleeding: Secondary | ICD-10-CM

## 2021-11-04 DIAGNOSIS — Z13220 Encounter for screening for lipoid disorders: Secondary | ICD-10-CM

## 2021-11-05 LAB — LIPID PANEL
Chol/HDL Ratio: 5.2 ratio — ABNORMAL HIGH (ref 0.0–4.4)
Cholesterol, Total: 141 mg/dL (ref 100–199)
HDL: 27 mg/dL — ABNORMAL LOW (ref >39)
LDL Chol Calc (NIH): 94 mg/dL (ref 0–99)
Triglycerides: 110 mg/dL (ref 0–149)
VLDL Cholesterol Cal: 20 mg/dL (ref 5–40)

## 2021-11-05 LAB — TSH: TSH: 2.58 u[IU]/mL (ref 0.450–4.500)

## 2021-11-05 LAB — HEMOGLOBIN A1C
Est. average glucose Bld gHb Est-mCnc: 103 mg/dL
Hgb A1c MFr Bld: 5.2 % (ref 4.8–5.6)

## 2021-11-06 ENCOUNTER — Encounter: Payer: Self-pay | Admitting: Obstetrics and Gynecology

## 2021-11-12 ENCOUNTER — Ambulatory Visit: Payer: BC Managed Care – PPO | Admitting: Urology

## 2021-11-17 ENCOUNTER — Ambulatory Visit
Admission: RE | Admit: 2021-11-17 | Discharge: 2021-11-17 | Disposition: A | Discharge status: Home / Self Care | Payer: BC Managed Care – PPO | Source: Ambulatory Visit | Attending: Urology | Admitting: Urology

## 2021-11-17 DIAGNOSIS — N1339 Other hydronephrosis: Secondary | ICD-10-CM | POA: Diagnosis not present

## 2021-12-04 ENCOUNTER — Ambulatory Visit: Payer: BC Managed Care – PPO | Admitting: Urology

## 2021-12-04 VITALS — BP 150/99 | HR 94 | Ht 62.0 in | Wt 194.4 lb

## 2021-12-04 DIAGNOSIS — Z87448 Personal history of other diseases of urinary system: Secondary | ICD-10-CM

## 2021-12-04 DIAGNOSIS — R109 Unspecified abdominal pain: Secondary | ICD-10-CM

## 2021-12-04 DIAGNOSIS — N1339 Other hydronephrosis: Secondary | ICD-10-CM

## 2021-12-04 NOTE — Patient Instructions (Signed)

## 2021-12-04 NOTE — Progress Notes (Signed)
12/04/21 4:17 PM   [name] 1986-02-25 [id number]  CC: Right flank pain and hydronephrosis  HPI: I saw Ms. Read for further evaluation of right-sided flank pain and hydronephrosis.  She presented to the ER on 10/30/2021 with worsening right-sided flank pain and nausea.  CT showed mild to moderate right-sided hydroureteronephrosis to the pelvis of unclear etiology.  No ureteral stones were seen, small left nonobstructing renal stone.  There is also an adnexal cystic lesion that did not appear particularly worrisome on CT, but was near the area of ureteral narrow rowing.  Her urinalysis was benign and she was discharged with GYN and urology follow-up.  She saw her gynecologist Dr. Marcelline Mates a week after being in the ER and her pain and symptoms had resolved.  A renal ultrasound was performed on 11/17/2021 which showed no hydronephrosis.  She denies any recurrent severe right-sided flank pain or nausea.  I reviewed the notes from Dr. Marcelline Mates.  PMH: Past Medical History:  Diagnosis Date   Anxiety    Cardiomyopathy ([place])    HYPERTROPHIC   Chronic blood loss anemia    iron infusions   Depression    Endometriosis    Enlarged heart    Fibroid uterus    Heart murmur    Herpes simplex virus (HSV) infection    History of blood transfusion 2016   2 units, given for chronic blood loss anemia    Hx MRSA infection    wound that required a drain   Hypertension    Sleep apnea    OSA, not currently using CPAP   TOA (tubo-ovarian abscess) 11/28/2014   Vaginal Pap smear, abnormal 2017   ASCUS with HPV+    Surgical History: Past Surgical History:  Procedure Laterality Date   endometreoisis     surgery   INCISIONAL HERNIA REPAIR  09/07/2019   Procedure: ROBOTIC ASSISTED INCISIONAL HERNIA x5;  Surgeon: Olean Ree, MD;  Location: [facility];  Service: General;;   IR IMAGE GUIDED DRAINAGE BY PERCUTANEOUS CATHETER  2016   left tubo-ovarian abscess, performed at [place]   09/07/2019   Procedure: ROBOTIC ASSISTED EXCISION OF RIGHT UPPER QUADRANT  MASS;  Surgeon: Rubie Maid, MD;  Location: [facility];  Service: Gynecology;;   Kristine Royal ASSISTED MYOMECTOMY  05/2010   [facility]   ROBOT ASSISTED MYOMECTOMY N/A 08/24/2018   Procedure: ROBOTIC ASSISTED MYOMECTOMY;  Surgeon: Rubie Maid, MD;  Location: [facility];  Service: Gynecology;  Laterality: N/A;   ROBOTIC ASSISTED LAPAROSCOPIC LYSIS OF ADHESION N/A 08/24/2018   Procedure: ROBOTIC ASSISTED LAPAROSCOPIC LYSIS OF ADHESION;  Surgeon: Rubie Maid, MD;  Location: [facility];  Service: Gynecology;  Laterality: N/A;    Family History: Family History  Problem Relation Age of Onset   Hypertension Mother    Diabetes Mother    Hypertension Father     Social History:  reports that she has never smoked. She has never used smokeless tobacco. She reports current alcohol use. She reports that she does not use drugs.  Physical Exam: BP (!) 150/99   Pulse 94   Ht '5\' 2"'$  (1.575 m)   Wt 194 lb 6.4 oz (88.2 kg)   LMP 09/28/2021 (Exact Date)   BMI 35.56 kg/m    Constitutional:  Alert and oriented, No acute distress. Cardiovascular: No clubbing, cyanosis, or edema. Respiratory: Normal respiratory effort, no increased work of breathing. GI: Abdomen is soft, nontender, nondistended, no abdominal masses  Laboratory Data: Reviewed  Pertinent Imaging: I have  personally viewed and interpreted the CT dated 10/30/2021 as well as the renal ultrasound dated 11/17/2021.  CT shows hydroureteronephrosis down to the pelvis with no definitive obstructing stone, and hydronephrosis resolved on follow-up renal ultrasound.  Assessment & Plan:   36 year old female with right-sided mild to moderate hydroureteronephrosis that has since resolved on follow-up renal ultrasound and symptoms of flank pain have also resolved.  Possible etiologies include a spontaneously passed kidney stone, or external compression from adnexal cystic lesion that has since  resolved.  She follow closely with her gynecologist Dr. Marcelline Mates.  We discussed general stone prevention strategies including adequate hydration with goal of producing 2.5 L of urine daily, increasing citric acid intake, increasing calcium intake during high oxalate meals, minimizing animal protein, and decreasing salt intake. Information about dietary recommendations given today.   Follow-up with urology as needed  Nickolas Madrid, MD 12/04/2021  [facility] [address], [place], [place] [address] [phone]

## 2022-01-14 ENCOUNTER — Encounter: Payer: Self-pay | Admitting: Obstetrics and Gynecology

## 2022-01-14 MED ORDER — ORILISSA 150 MG PO TABS: 1.0000 | ORAL_TABLET | Freq: Every day | ORAL | 11 refills | Status: DC

## 2022-01-14 MED ORDER — ORILISSA 200 MG PO TABS: 200.0000 mg | ORAL_TABLET | Freq: Every day | ORAL | 5 refills | Status: DC

## 2022-01-21 ENCOUNTER — Telehealth: Payer: Self-pay

## 2022-01-21 NOTE — Telephone Encounter (Signed)
[facility] calling; recv'd two rxs on 11/8th for orilissa '150mg'$  and one for '200mg'$ .  [phone]  Spoke c pharmacy staff adv them to fill the '200mg'$  rx per pt msg note from [facility]; it needs a prior auth which they have started.

## 2022-02-07 ENCOUNTER — Encounter: Payer: Self-pay | Admitting: Obstetrics and Gynecology

## 2022-02-10 ENCOUNTER — Encounter: Payer: Self-pay | Admitting: Obstetrics and Gynecology

## 2022-02-17 MED ORDER — ORILISSA 200 MG PO TABS: 200.0000 mg | ORAL_TABLET | Freq: Every day | ORAL | 5 refills | Status: DC

## 2022-02-17 MED ORDER — ORILISSA 150 MG PO TABS: 1.0000 | ORAL_TABLET | Freq: Every day | ORAL | 11 refills | Status: DC

## 2022-02-18 ENCOUNTER — Other Ambulatory Visit: Payer: Self-pay

## 2022-02-18 MED ORDER — ORILISSA 200 MG PO TABS: 200.0000 mg | ORAL_TABLET | Freq: Every day | ORAL | 5 refills | Status: DC

## 2022-03-19 ENCOUNTER — Encounter: Payer: Self-pay | Admitting: Obstetrics and Gynecology

## 2022-03-19 ENCOUNTER — Other Ambulatory Visit: Payer: Self-pay | Admitting: Obstetrics and Gynecology

## 2022-03-19 MED ORDER — WEGOVY 2.4 MG/0.75ML ~~LOC~~ SOAJ: SUBCUTANEOUS | 3 refills | Status: DC

## 2022-03-26 ENCOUNTER — Encounter: Payer: Self-pay | Admitting: Obstetrics and Gynecology

## 2022-04-25 ENCOUNTER — Encounter: Payer: Self-pay | Admitting: Obstetrics and Gynecology

## 2022-04-25 DIAGNOSIS — N809 Endometriosis, unspecified: Secondary | ICD-10-CM

## 2022-04-28 MED ORDER — ORILISSA 200 MG PO TABS: 200.0000 mg | ORAL_TABLET | Freq: Two times a day (BID) | ORAL | 0 refills | Status: AC

## 2022-05-07 ENCOUNTER — Encounter: Payer: Self-pay | Admitting: Obstetrics and Gynecology

## 2022-07-28 ENCOUNTER — Other Ambulatory Visit: Payer: Self-pay | Admitting: Obstetrics and Gynecology

## 2022-07-29 ENCOUNTER — Encounter: Payer: Self-pay | Admitting: Obstetrics and Gynecology

## 2022-08-20 ENCOUNTER — Ambulatory Visit (INDEPENDENT_AMBULATORY_CARE_PROVIDER_SITE_OTHER): Payer: BC Managed Care – PPO | Admitting: Obstetrics and Gynecology

## 2022-08-20 ENCOUNTER — Encounter: Payer: Self-pay | Admitting: Obstetrics and Gynecology

## 2022-08-20 VITALS — BP 125/86 | HR 77 | Resp 16 | Ht 62.0 in | Wt 173.9 lb

## 2022-08-20 DIAGNOSIS — Z713 Dietary counseling and surveillance: Secondary | ICD-10-CM

## 2022-08-20 DIAGNOSIS — I10 Essential (primary) hypertension: Secondary | ICD-10-CM

## 2022-08-20 DIAGNOSIS — Z7689 Persons encountering health services in other specified circumstances: Secondary | ICD-10-CM

## 2022-08-20 DIAGNOSIS — R7303 Prediabetes: Secondary | ICD-10-CM

## 2022-08-20 DIAGNOSIS — E669 Obesity, unspecified: Secondary | ICD-10-CM | POA: Diagnosis not present

## 2022-08-20 DIAGNOSIS — Z6831 Body mass index (BMI) 31.0-31.9, adult: Secondary | ICD-10-CM

## 2022-08-20 DIAGNOSIS — D259 Leiomyoma of uterus, unspecified: Secondary | ICD-10-CM | POA: Diagnosis not present

## 2022-08-20 DIAGNOSIS — N809 Endometriosis, unspecified: Secondary | ICD-10-CM

## 2022-08-20 NOTE — Progress Notes (Signed)
    GYNECOLOGY PROGRESS NOTE  Subjective:    Patient ID: Lynn Boyd, female    DOB: June 02, 1985, 37 y.o.   MRN: [id number]  HPI  Patient is a 37 y.o. G0P0000 female who presents for weight management follow up and management of endometriosis, abnormal uterine bleeding, and fibroids. She also has a past history of obesity, PCOS, obesity, pre-diabetes, hypertension.  She has been on the [facility] for slightly over 1 year.  Is due for a refill.  Starting weight was 242 lbs in February of last year.   Also looking for further management of her endometriosis and fibroids. Was on Orilissa for a total of 2 years which tremendously helped her symptoms.  Was off the medication for approximately 1 year, and during that time she noted that she began to resume having menstrual cycles that were initially very light and manageable.  States that over the past few months her cycles are still light but are becoming longer in length. Desired to resume Dewayne Hatch but has been denied by her insurance company. Has been receiving samples for the past 2 months. Also has been taking Aygestin. She is also currently undergoing fertility evaluation as she desires to undergo IVF however and recently noted that they found another moderate sized fibroid. She has a history of myomectomy several years ago.    Current dietary interventions:  1. Diet - She has decreased appetite with the [facility]. She eats two times a day. She eats the most when she is on her cycle. 2. Activity - She has started back walking, not as consistent as she was because of work 3. Reports bowel movements are normal for the most part. She does have some constipation sometimes, but she drinks a lot of water when she gets constipated.    The following portions of the patient's history were reviewed and updated as appropriate: allergies, current medications, past family history, past medical history, past social history, past surgical history, and problem  list.    Review of Systems Pertinent items are noted in HPI.   Objective:    Blood pressure 125/86, pulse 77, resp. rate 16, height 5\' 2"  (1.575 m), weight 173 lb 14.4 oz (78.9 kg). Body mass index is 31.81 kg/m.    General appearance: alert, cooperative, and no distress Abdomen: soft, non-tender.  Waist circumference 38 in.    Labs:   Assessment:   Weight management Obesity, Body mass index is 31.81 kg/m. Endometriosis Prediabetes Essential hypertension Fibroid uterus  Plan:   Weight management  - doing well with weight loss, has lost 69 lbs over the past 1.5 years. Can continue current management.  Will refill Wegovy.  Prediabetes - patient notes she had labs performed recently during evaluation of her infertility, notes A1c was within normal limits.  HTN - well controlled, currently on Labetalol.  Endometriosis - patient desiring to resume Dewayne Hatch however has completed 2 years of therapy. Managing on samples provided by the office until other options available. Will try to see if Myfembree is covered instead. Given samples of medication. Notes that she may have to consider surrogacy if IVF is not an option.   To follow up in 2 months for annual exam.     Hildred Laser, MD [facility]

## 2022-08-21 MED ORDER — MYFEMBREE 40-1-0.5 MG PO TABS: 1.0000 | ORAL_TABLET | Freq: Every day | ORAL | 3 refills | Status: DC

## 2022-08-22 ENCOUNTER — Other Ambulatory Visit: Payer: Self-pay | Admitting: Obstetrics and Gynecology

## 2022-08-24 ENCOUNTER — Telehealth: Payer: Self-pay

## 2022-08-27 NOTE — Telephone Encounter (Signed)
Prior authorization has been started 

## 2022-08-28 NOTE — Telephone Encounter (Signed)
Additional questions answered. Records submitted thru [facility] (Key: [id number]) - [id number] Myfembree 40-1-0.5MG  tablets Status: PA RequestCreated: June 17th, [id number] Sent: June 21st, 2024

## 2022-09-02 NOTE — Telephone Encounter (Signed)
Questions have been answered and faxed. Waiting on response.

## 2022-09-18 ENCOUNTER — Encounter: Payer: Self-pay | Admitting: Obstetrics and Gynecology

## 2022-09-19 ENCOUNTER — Other Ambulatory Visit: Payer: Self-pay | Admitting: Obstetrics and Gynecology

## 2022-10-20 ENCOUNTER — Other Ambulatory Visit: Payer: Self-pay | Admitting: Obstetrics and Gynecology

## 2022-10-28 ENCOUNTER — Encounter: Payer: Self-pay | Admitting: Obstetrics and Gynecology

## 2022-12-05 ENCOUNTER — Other Ambulatory Visit: Payer: Self-pay | Admitting: Obstetrics and Gynecology

## 2023-01-03 ENCOUNTER — Other Ambulatory Visit: Payer: Self-pay

## 2023-01-06 ENCOUNTER — Ambulatory Visit (INDEPENDENT_AMBULATORY_CARE_PROVIDER_SITE_OTHER): Payer: BC Managed Care – PPO | Admitting: Obstetrics and Gynecology

## 2023-01-06 ENCOUNTER — Encounter: Payer: Self-pay | Admitting: Obstetrics and Gynecology

## 2023-01-06 VITALS — BP 121/74 | HR 86 | Resp 16 | Ht 62.0 in | Wt 169.5 lb

## 2023-01-06 DIAGNOSIS — R7303 Prediabetes: Secondary | ICD-10-CM

## 2023-01-06 DIAGNOSIS — Z01419 Encounter for gynecological examination (general) (routine) without abnormal findings: Secondary | ICD-10-CM

## 2023-01-06 DIAGNOSIS — N809 Endometriosis, unspecified: Secondary | ICD-10-CM

## 2023-01-06 DIAGNOSIS — E785 Hyperlipidemia, unspecified: Secondary | ICD-10-CM

## 2023-01-06 DIAGNOSIS — Z8639 Personal history of other endocrine, nutritional and metabolic disease: Secondary | ICD-10-CM

## 2023-01-06 DIAGNOSIS — Z1159 Encounter for screening for other viral diseases: Secondary | ICD-10-CM

## 2023-01-06 DIAGNOSIS — I10 Essential (primary) hypertension: Secondary | ICD-10-CM

## 2023-01-06 DIAGNOSIS — E66811 Obesity, class 1: Secondary | ICD-10-CM

## 2023-01-06 NOTE — Progress Notes (Signed)
GYNECOLOGY ANNUAL PHYSICAL EXAM PROGRESS NOTE  Subjective:    Lynn Boyd is a 37 y.o. G0P0000 female who presents for an annual exam. She has a past medical history significant for HTN, PCOS, pre-diabetes, fibroid uterus, menorrhagia, endometriosis, and anemia. The patient is sexually active. The patient participates in regular exercise: yes. Has the patient ever been transfused or tattooed?: no. The patient reports that there is not domestic violence in her life.   The patient has the following complaints today: Notes that she was seen at [facility]. Reports that she was told that she had a fibroid hat needs to be removed prior to proceeding with fertility treatments. Thinks it is most likely the fibroid that she had in the past. Is planning on second opinion with [facility] (thinks she will plan on switching care).   Still noting some spotting with the Myfembree, but otherwise no major issues. Does note that her cycles are getting a little heavier on Days 2-3.  Notes she is still having issues with her Reginal Lutes being refilled through her pharmacy.     Menstrual History: Menarche age: 36 Patient's last menstrual period was 12/30/2022 (approximate). Period Cycle (Days): 30 Period Duration (Days): 7 Period Pattern: Regular Menstrual Flow: Moderate Menstrual Control: Maxi pad Menstrual Control Change Freq (Hours): 3-4 Dysmenorrhea: (!) Moderate Dysmenorrhea Symptoms: Cramping   Gynecologic History:  Contraception: none History of STI's: HSV no recent outbreaks Last Pap: 09/11/2020. Results were: normal. Notes h/o abnormal pap smears. Last mammogram: Not age appropriate    OB History  Gravida Para Term Preterm AB Living  0 0 0 0 0 0  SAB IAB Ectopic Multiple Live Births  0 0 0 0 0    Past Medical History:  Diagnosis Date   Anxiety    Cardiomyopathy (HCC)    HYPERTROPHIC   Chronic blood loss anemia    iron infusions   Depression    Endometriosis     Enlarged heart    Fibroid uterus    Heart murmur    Herpes simplex virus (HSV) infection    History of blood transfusion 2016   2 units, given for chronic blood loss anemia    Hx MRSA infection    wound that required a drain   Hypertension    Sleep apnea    OSA, not currently using CPAP   TOA (tubo-ovarian abscess) 11/28/2014   Vaginal Pap smear, abnormal 2017   ASCUS with HPV+    Past Surgical History:  Procedure Laterality Date   endometreoisis     surgery   INCISIONAL HERNIA REPAIR  09/07/2019   Procedure: ROBOTIC ASSISTED INCISIONAL HERNIA x5;  Surgeon: Henrene Dodge, MD;  Location: [facility];  Service: General;;   IR IMAGE GUIDED DRAINAGE BY PERCUTANEOUS CATHETER  2016   left tubo-ovarian abscess, performed at [facility]   MASS EXCISION  09/07/2019   Procedure: ROBOTIC ASSISTED EXCISION OF RIGHT UPPER QUADRANT  MASS;  Surgeon: Hildred Laser, MD;  Location: [facility] OR[facility];  Service: Gynecology;;   Gwenyth Bender ASSISTED MYOMECTOMY  05/2010   [facility]   ROBOT ASSISTED MYOMECTOMY N/A 08/24/2018   Procedure: ROBOTIC ASSISTED MYOMECTOMY;  Surgeon: Hildred Laser, MD;  Location: [facility] OR[facility];  Service: Gynecology;  Laterality: N/A;   ROBOTIC ASSISTED LAPAROSCOPIC LYSIS OF ADHESION N/A 08/24/2018   Procedure: ROBOTIC ASSISTED LAPAROSCOPIC LYSIS OF ADHESION;  Surgeon: Hildred Laser, MD;  Location: [facility];  Service: Gynecology;  Laterality: N/A;    Family History  Problem Relation Age of Onset  Hypertension Mother    Diabetes Mother    Hypertension Father     Social History   Socioeconomic History   Marital status: Single    Spouse name: Not on file   Number of children: Not on file   Years of education: Not on file   Highest education level: Not on file  Occupational History   Occupation: Child psychotherapist    Employer: [employer]  Tobacco Use   Smoking status: Never   Smokeless tobacco: Never  Vaping Use   Vaping status: Never Used  Substance and Sexual Activity   Alcohol use: Yes     Alcohol/week: 0.0 standard drinks of alcohol    Comment: socially and occa   Drug use: No   Sexual activity: Yes    Birth control/protection: None  Other Topics Concern   Not on file  Social History Narrative   Not on file   Social Determinants of Health   Financial Resource Strain: Low Risk  (10/14/2020)   Received from [facility], [facility]   Overall Financial Resource Strain (CARDIA)    Difficulty of Paying Living Expenses: Not hard at all  Food Insecurity: No Food Insecurity (10/14/2020)   Received from [facility], [facility]   Hunger Vital Sign    Worried About Running Out of Food in the Last Year: Never true    Ran Out of Food in the Last Year: Never true  Transportation Needs: No Transportation Needs (10/14/2020)   Received from [facility], [facility]   [facility] - Transportation    Lack of Transportation (Medical): No    Lack of Transportation (Non-Medical): No  Physical Activity: Not on file  Stress: Not on file  Social Connections: Not on file  Intimate Partner Violence: Not on file    Current Outpatient Medications on File Prior to Visit  Medication Sig Dispense Refill   amLODipine (NORVASC) 5 MG tablet Take 10 mg by mouth 2 (two) times daily.     cholecalciferol (VITAMIN D3) 25 MCG (1000 UT) tablet Take 1,000 Units by mouth daily.     clindamycin (CLINDAGEL) 1 % gel Apply 1 application topically daily.      FEROSUL 325 (65 Fe) MG tablet Take 325 mg by mouth daily with breakfast.     fluticasone (FLONASE) 50 MCG/ACT nasal spray Place 1 spray into both nostrils daily as needed.     labetalol (NORMODYNE) 100 MG tablet Take 200 mg by mouth 2 (two) times daily.     naproxen sodium (ALEVE) 220 MG tablet Take 220 mg by mouth.     Relugolix-Estradiol-Norethind (MYFEMBREE) 40-1-0.5 MG TABS Take 1 tablet by mouth daily. 84 tablet 3   Semaglutide-Weight Management (WEGOVY) 2.4 MG/0.75ML SOAJ INJECT 2.4 MG SUBCUTANEOUSLY ONCE A WEEK 4 mL 11    tretinoin (RETIN-A) 0.025 % cream Apply 1 application topically at bedtime.      triamcinolone ointment (KENALOG) 0.5 % Apply 1 Application topically 2 (two) times daily as needed.     valACYclovir (VALTREX) 500 MG tablet Take 1 tablet by mouth daily as needed.     No current facility-administered medications on file prior to visit.    Allergies  Allergen Reactions   Bactrim [Sulfamethoxazole-Trimethoprim] Swelling   Latex Rash     Review of Systems Constitutional: negative for chills, fatigue, fevers and sweats Eyes: negative for irritation, redness and visual disturbance Ears, nose, mouth, throat, and face: negative for hearing loss, nasal congestion, snoring and  tinnitus Respiratory: negative for asthma, cough, sputum Cardiovascular: negative for chest pain, dyspnea, exertional chest pressure/discomfort, irregular heart beat, palpitations and syncope Gastrointestinal: negative for abdominal pain, change in bowel habits, nausea and vomiting Genitourinary: negative for abnormal menstrual periods, genital lesions, sexual problems and vaginal discharge, dysuria and urinary incontinence Integument/breast: negative for breast lump, breast tenderness and nipple discharge Hematologic/lymphatic: negative for bleeding and easy bruising Musculoskeletal:negative for back pain and muscle weakness Neurological: negative for dizziness, headaches, vertigo and weakness Endocrine: negative for diabetic symptoms including polydipsia, polyuria and skin dryness Allergic/Immunologic: negative for hay fever and urticaria      Objective:  Blood pressure 121/74, pulse 86, resp. rate 16, height 5\' 2"  (1.575 m), weight 169 lb 8 oz (76.9 kg), last menstrual period 12/30/2022. Body mass index is 31 kg/m.    General Appearance:    Alert, cooperative, no distress, appears stated age  Head:    Normocephalic, without obvious abnormality, atraumatic  Eyes:    PERRL, conjunctiva/corneas clear, EOM's intact,  both eyes  Ears:    Normal external ear canals, both ears  Nose:   Nares normal, septum midline, mucosa normal, no drainage or sinus tenderness  Throat:   Lips, mucosa, and tongue normal; teeth and gums normal  Neck:   Supple, symmetrical, trachea midline, no adenopathy; thyroid: no enlargement/tenderness/nodules; no carotid bruit or JVD  Back:     Symmetric, no curvature, ROM normal, no CVA tenderness  Lungs:     Clear to auscultation bilaterally, respirations unlabored  Chest Wall:    No tenderness or deformity   Heart:    Regular rate and rhythm, S1 and S2 normal, no murmur, rub or gallop  Breast Exam:    No tenderness, masses, or nipple abnormality  Abdomen:     Soft, non-tender, bowel sounds active all four quadrants, no masses, no organomegaly.    Genitalia:    Pelvic:external genitalia normal, vagina without lesions, discharge, or tenderness, rectovaginal septum  normal. Cervix normal in appearance, no cervical motion tenderness, no adnexal masses or tenderness.  Uterus upper limits of normal (12 week size), normal shape, mobile, regular contours, nontender.  Rectal:    Normal external sphincter.  No hemorrhoids appreciated. Internal exam not done.   Extremities:   Extremities normal, atraumatic, no cyanosis or edema  Pulses:   2+ and symmetric all extremities  Skin:   Skin color, texture, turgor normal, no rashes or lesions  Lymph nodes:   Cervical, supraclavicular, and axillary nodes normal  Neurologic:   CNII-XII intact, normal strength, sensation and reflexes throughout     Labs:  Lab Results  Component Value Date   WBC 5.2 10/30/2021   HGB 12.6 10/30/2021   HCT 40.6 10/30/2021   MCV 82.2 10/30/2021   PLT 242 10/30/2021    Lab Results  Component Value Date   CREATININE 1.19 (H) 10/30/2021   BUN 14 10/30/2021   NA 138 10/30/2021   K 3.4 (L) 10/30/2021   CL 104 10/30/2021   CO2 25 10/30/2021    Lab Results  Component Value Date   ALT 9 10/30/2021   AST 15  10/30/2021   ALKPHOS 61 10/30/2021   BILITOT 0.4 10/30/2021    Lab Results  Component Value Date   TSH 2.580 11/04/2021     Assessment:   1. Encounter for well woman exam with routine gynecological exam   2. Prediabetes   3. Essential hypertension   4. Obesity (BMI 30.0-34.9)   5. Dyslipidemia   6.  Need for hepatitis C screening test   7. Endometriosis      Plan:  - Blood tests: see orders. - Breast self exam technique reviewed and patient encouraged to perform self-exam monthly. - Contraception:  None (seeking pregnancy) . - Discussed healthy lifestyle modifications.  Desires to resume Reginal Lutes, will contact insurance company.  - Mammogram  Not age appropriate . To begin screens next year.  - Pap smear  UTD . - Flu vaccine: Done - Discussed one-time screening of Hepatitis C. Patient ok to perform.  - Dyslipidemia, no meds.  - HTN managed by Cardiology, on Labetalol and Norvasc. Fibroid uterus with slight increase in menstrual flow.  Notes having recent sonohystogram performed in July. Noted a polyp and fibroid. Will f/u in [facility] in December.  - Follow up in 1 year for annual exam   Hildred Laser, MD [facility]

## 2023-01-06 NOTE — Patient Instructions (Signed)
Preventive Care 21-39 Years Old, Female Preventive care refers to lifestyle choices and visits with your health care provider that can promote health and wellness. Preventive care visits are also called wellness exams. What can I expect for my preventive care visit? Counseling During your preventive care visit, your health care provider may ask about your: Medical history, including: Past medical problems. Family medical history. Pregnancy history. Current health, including: Menstrual cycle. Method of birth control. Emotional well-being. Home life and relationship well-being. Sexual activity and sexual health. Lifestyle, including: Alcohol, nicotine or tobacco, and drug use. Access to firearms. Diet, exercise, and sleep habits. Work and work environment. Sunscreen use. Safety issues such as seatbelt and bike helmet use. Physical exam Your health care provider may check your: Height and weight. These may be used to calculate your BMI (body mass index). BMI is a measurement that tells if you are at a healthy weight. Waist circumference. This measures the distance around your waistline. This measurement also tells if you are at a healthy weight and may help predict your risk of certain diseases, such as type 2 diabetes and high blood pressure. Heart rate and blood pressure. Body temperature. Skin for abnormal spots. What immunizations do I need?  Vaccines are usually given at various ages, according to a schedule. Your health care provider will recommend vaccines for you based on your age, medical history, and lifestyle or other factors, such as travel or where you work. What tests do I need? Screening Your health care provider may recommend screening tests for certain conditions. This may include: Pelvic exam and Pap test. Lipid and cholesterol levels. Diabetes screening. This is done by checking your blood sugar (glucose) after you have not eaten for a while (fasting). Hepatitis  B test. Hepatitis C test. HIV (human immunodeficiency virus) test. STI (sexually transmitted infection) testing, if you are at risk. BRCA-related cancer screening. This may be done if you have a family history of breast, ovarian, tubal, or peritoneal cancers. Talk with your health care provider about your test results, treatment options, and if necessary, the need for more tests. Follow these instructions at home: Eating and drinking  Eat a healthy diet that includes fresh fruits and vegetables, whole grains, lean protein, and low-fat dairy products. Take vitamin and mineral supplements as recommended by your health care provider. Do not drink alcohol if: Your health care provider tells you not to drink. You are pregnant, may be pregnant, or are planning to become pregnant. If you drink alcohol: Limit how much you have to 0-1 drink a day. Know how much alcohol is in your drink. In the [place] one drink equals one 12 oz bottle of beer (355 mL), one 5 oz glass of wine (148 mL), or one 1 oz glass of hard liquor (44 mL). Lifestyle Brush your teeth every morning and night with fluoride toothpaste. Floss one time each day. Exercise for at least 30 minutes 5 or more days each week. Do not use any products that contain nicotine or tobacco. These products include cigarettes, chewing tobacco, and vaping devices, such as e-cigarettes. If you need help quitting, ask your health care provider. Do not use drugs. If you are sexually active, practice safe sex. Use a condom or other form of protection to prevent STIs. If you do not wish to become pregnant, use a form of birth control. If you plan to become pregnant, see your health care provider for a prepregnancy visit. Find healthy ways to manage stress, such as: Meditation,   yoga, or listening to music. Journaling. Talking to a trusted person. Spending time with friends and family. Minimize exposure to UV radiation to reduce your risk of skin  cancer. Safety Always wear your seat belt while driving or riding in a vehicle. Do not drive: If you have been drinking alcohol. Do not ride with someone who has been drinking. If you have been using any mind-altering substances or drugs. While texting. When you are tired or distracted. Wear a helmet and other protective equipment during sports activities. If you have firearms in your house, make sure you follow all gun safety procedures. Seek help if you have been physically or sexually abused. What's next? Go to your health care provider once a year for an annual wellness visit. Ask your health care provider how often you should have your eyes and teeth checked. Stay up to date on all vaccines. This information is not intended to replace advice given to you by your health care provider. Make sure you discuss any questions you have with your health care provider. Document Revised: 08/21/2020 Document Reviewed: 08/21/2020 Elsevier Patient Education  2024 Elsevier Inc. Breast Self-Awareness Breast self-awareness is knowing how your breasts look and feel. You need to: Check your breasts on a regular basis. Tell your doctor about any changes. Become familiar with the look and feel of your breasts. This can help you catch a breast problem while it is still small and can be treated. You should do breast self-exams even if you have breast implants. What you need: A mirror. A well-lit room. A pillow or other soft object. How to do a breast self-exam Follow these steps to do a breast self-exam: Look for changes  Take off all the clothes above your waist. Stand in front of a mirror in a room with good lighting. Put your hands down at your sides. Compare your breasts in the mirror. Look for any difference between them, such as: A difference in shape. A difference in size. Wrinkles, dips, and bumps in one breast and not the other. Look at each breast for changes in the skin, such  as: Redness. Scaly areas. Skin that has gotten thicker. Dimpling. Open sores (ulcers). Look for changes in your nipples, such as: Fluid coming out of a nipple. Fluid around a nipple. Bleeding. Dimpling. Redness. A nipple that looks pushed in (retracted), or that has changed position. Feel for changes Lie on your back. Feel each breast. To do this: Pick a breast to feel. Place a pillow under the shoulder closest to that breast. Put the arm closest to that breast behind your head. Feel the nipple area of that breast using the hand of your other arm. Feel the area with the pads of your three middle fingers by making small circles with your fingers. Use light, medium, and firm pressure. Continue the overlapping circles, moving downward over the breast. Keep making circles with your fingers. Stop when you feel your ribs. Start making circles with your fingers again, this time going upward until you reach your collarbone. Then, make circles outward across your breast and into your armpit area. Squeeze your nipple. Check for discharge and lumps. Repeat these steps to check your other breast. Sit or stand in the tub or shower. With soapy water on your skin, feel each breast the same way you did when you were lying down. Write down what you find Writing down what you find can help you remember what to tell your doctor. Write down: What is   normal for each breast. Any changes you find in each breast. These include: The kind of changes you find. A tender or painful breast. Any lump you find. Write down its size and where it is. When you last had your monthly period (menstrual cycle). General tips If you are breastfeeding, the best time to check your breasts is after you feed your baby or after you use a breast pump. If you get monthly bleeding, the best time to check your breasts is 5-7 days after your monthly cycle ends. With time, you will become comfortable with the self-exam. You will  also start to know if there are changes in your breasts. Contact a doctor if: You see a change in the shape or size of your breasts or nipples. You see a change in the skin of your breast or nipples, such as red or scaly skin. You have fluid coming from your nipples that is not normal. You find a new lump or thick area. You have breast pain. You have any concerns about your breast health. Summary Breast self-awareness includes looking for changes in your breasts and feeling for changes within your breasts. You should do breast self-awareness in front of a mirror in a well-lit room. If you get monthly periods (menstrual cycles), the best time to check your breasts is 5-7 days after your period ends. Tell your doctor about any changes you see in your breasts. Changes include changes in size, changes on the skin, painful or tender breasts, or fluid from your nipples that is not normal. This information is not intended to replace advice given to you by your health care provider. Make sure you discuss any questions you have with your health care provider. Document Revised: 07/31/2021 Document Reviewed: 12/26/2020 Elsevier Patient Education  2024 Elsevier Inc.  

## 2023-01-07 LAB — LIPID PANEL
Chol/HDL Ratio: 4.1 ratio (ref 0.0–4.4)
Cholesterol, Total: 163 mg/dL (ref 100–199)
HDL: 40 mg/dL (ref >39)
LDL Chol Calc (NIH): 105 mg/dL — ABNORMAL HIGH (ref 0–99)
Triglycerides: 94 mg/dL (ref 0–149)
VLDL Cholesterol Cal: 18 mg/dL (ref 5–40)

## 2023-01-07 LAB — COMPREHENSIVE METABOLIC PANEL
ALT: 10 [IU]/L (ref 0–32)
AST: 15 [IU]/L (ref 0–40)
Albumin: 4.3 g/dL (ref 3.9–4.9)
Alkaline Phosphatase: 54 [IU]/L (ref 44–121)
BUN/Creatinine Ratio: 12 (ref 9–23)
BUN: 12 mg/dL (ref 6–20)
Bilirubin Total: 0.3 mg/dL (ref 0.0–1.2)
CO2: 23 mmol/L (ref 20–29)
Calcium: 9.5 mg/dL (ref 8.7–10.2)
Chloride: 104 mmol/L (ref 96–106)
Creatinine, Ser: 1.04 mg/dL — ABNORMAL HIGH (ref 0.57–1.00)
Globulin, Total: 2.7 g/dL (ref 1.5–4.5)
Glucose: 93 mg/dL (ref 70–99)
Potassium: 3.7 mmol/L (ref 3.5–5.2)
Sodium: 139 mmol/L (ref 134–144)
Total Protein: 7 g/dL (ref 6.0–8.5)
eGFR: 70 mL/min/{1.73_m2} (ref >59)

## 2023-01-07 LAB — CBC
Hematocrit: 39 % (ref 34.0–46.6)
Hemoglobin: 12.6 g/dL (ref 11.1–15.9)
MCH: 28.3 pg (ref 26.6–33.0)
MCHC: 32.3 g/dL (ref 31.5–35.7)
MCV: 88 fL (ref 79–97)
Platelets: 208 10*3/uL (ref 150–450)
RBC: 4.45 x10E6/uL (ref 3.77–5.28)
RDW: 13.2 % (ref 11.7–15.4)
WBC: 3.6 10*3/uL (ref 3.4–10.8)

## 2023-01-07 LAB — VITAMIN D 25 HYDROXY (VIT D DEFICIENCY, FRACTURES): Vit D, 25-Hydroxy: 44.3 ng/mL (ref 30.0–100.0)

## 2023-01-07 LAB — TSH: TSH: 2.14 u[IU]/mL (ref 0.450–4.500)

## 2023-01-07 LAB — HEMOGLOBIN A1C
Est. average glucose Bld gHb Est-mCnc: 88 mg/dL
Hgb A1c MFr Bld: 4.7 % — ABNORMAL LOW (ref 4.8–5.6)

## 2023-05-10 ENCOUNTER — Encounter: Payer: Self-pay | Admitting: Obstetrics and Gynecology

## 2023-09-02 ENCOUNTER — Ambulatory Visit (INDEPENDENT_AMBULATORY_CARE_PROVIDER_SITE_OTHER): Admitting: Obstetrics

## 2023-09-02 ENCOUNTER — Encounter: Payer: Self-pay | Admitting: Obstetrics

## 2023-09-02 VITALS — BP 120/83 | HR 77 | Ht 62.0 in | Wt 169.3 lb

## 2023-09-02 DIAGNOSIS — N803 Endometriosis of pelvic peritoneum, unspecified: Secondary | ICD-10-CM | POA: Diagnosis not present

## 2023-09-02 DIAGNOSIS — N809 Endometriosis, unspecified: Secondary | ICD-10-CM | POA: Insufficient documentation

## 2023-09-02 DIAGNOSIS — Z9189 Other specified personal risk factors, not elsewhere classified: Secondary | ICD-10-CM

## 2023-09-02 DIAGNOSIS — N938 Other specified abnormal uterine and vaginal bleeding: Secondary | ICD-10-CM

## 2023-09-02 DIAGNOSIS — Z789 Other specified health status: Secondary | ICD-10-CM

## 2023-09-02 DIAGNOSIS — Z9889 Other specified postprocedural states: Secondary | ICD-10-CM

## 2023-09-02 DIAGNOSIS — D259 Leiomyoma of uterus, unspecified: Secondary | ICD-10-CM

## 2023-09-02 NOTE — Progress Notes (Signed)
    GYNECOLOGY PROGRESS NOTE  Subjective:  PCP: System, Provider Not In  Patient ID: Lynn Boyd, female    DOB: [birth year], 38 y.o.   MRN: [id number]  HPI  Patient is a 38 y.o. G0P0000 female who presents for f/up on fibroid uterus w/endometriosis and AUB. She has PMH of PCOS, elevated BMI is on Wegovy , pre-diabetes, hypertension, hypertrophic cardiomyopathy and is desiring to conceive, working with [facility] and [facility]. Is s/p two myomectomies.   She was on Orilissa  from 2021 - 2023, was symptom-controlled extremely well. Took 3 mo break after the 27yrs with intention to re-start, but had coverage issues and remained off for approx 1 year. Cycles and symptoms returned. She then started Myfembree  08/2022 with Dr. Connell and is also taking a supplement, Happy Flow, and feels this is helping tremendously. Still has spotting here and there, but is overall well controlled and desires to continue this therapy. No hx of Dexa scan prior.   Is attempting to conceive and working with both [facility] and [facility], may need to undergo another myomectomy prior to IVF. Understands may need to come off MyFembree  for surgery, and once undergoing fertility treatments. Desires to remain on medication until that time.   The following portions of the patient's history were reviewed and updated as appropriate: allergies, current medications, past family history, past medical history, past social history, past surgical history, and problem list.  Review of Systems Pertinent items are noted in HPI.   Objective:   Blood pressure 120/83, pulse 77, height 5' 2 (1.575 m), weight 169 lb 4.8 oz (76.8 kg), last menstrual period 08/09/2023. Body mass index is 30.97 kg/m.  General appearance: alert and cooperative Abdomen: soft, non-tender; bowel sounds normal; no masses,  no organomegaly Pelvic: deferred Extremities: extremities normal, atraumatic, no cyanosis or edema Neurologic: Grossly  normal   Assessment/Plan:   1. DUB (dysfunctional uterine bleeding)   2. Uterine leiomyoma, unspecified location   3. Endometriosis of pelvic peritoneum   4. History of myomectomy   5. Attempting to conceive    38 y.o. G0P0000 with AUB due to endometriosis and fibroids, well-controlled on MyFembree  since 08/2022, desiring to continue this medication for another year, or until she begins fertility treatments with [facility]. We discussed getting a baseline Dexa scan now since she has already been on Orilissa  for 46yrs prior, and now Myfembree  for 1 year. Medication refilled today for the next year. Recommend she see Zelda Hummer, CNM in the next 1-2 mos if she desires to continue Wegovy  management that was previously done with Dr. Connell.   Follow up Oct '25 for annual, sooner prn  Total time was 30 minutes. That includes chart review before the visit, the actual patient visit, and time spent on documentation after the visit. Time excludes procedures, if any.    Estil Mangle, DO [facility]

## 2023-09-03 MED ORDER — MYFEMBREE 40-1-0.5 MG PO TABS: 1.0000 | ORAL_TABLET | Freq: Every day | ORAL | 3 refills | Status: AC

## 2023-09-28 ENCOUNTER — Encounter: Payer: Self-pay | Admitting: Obstetrics

## 2023-10-20 ENCOUNTER — Ambulatory Visit: Admitting: Certified Nurse Midwife

## 2023-10-20 ENCOUNTER — Other Ambulatory Visit (HOSPITAL_COMMUNITY)
Admission: RE | Admit: 2023-10-20 | Discharge: 2023-10-20 | Disposition: A | Discharge status: Home / Self Care | Source: Ambulatory Visit | Attending: Certified Nurse Midwife | Admitting: Certified Nurse Midwife

## 2023-10-20 ENCOUNTER — Encounter: Payer: Self-pay | Admitting: Certified Nurse Midwife

## 2023-10-20 VITALS — BP 114/79 | HR 75 | Wt 171.3 lb

## 2023-10-20 DIAGNOSIS — Z6831 Body mass index (BMI) 31.0-31.9, adult: Secondary | ICD-10-CM | POA: Diagnosis not present

## 2023-10-20 DIAGNOSIS — N898 Other specified noninflammatory disorders of vagina: Secondary | ICD-10-CM

## 2023-10-20 DIAGNOSIS — E669 Obesity, unspecified: Secondary | ICD-10-CM | POA: Diagnosis not present

## 2023-10-20 MED ORDER — WEGOVY 2.4 MG/0.75ML ~~LOC~~ SOAJ: SUBCUTANEOUS | 11 refills | Status: AC

## 2023-10-20 NOTE — Progress Notes (Signed)
 SUBJECTIVE:  38 y.o. here for follow-up weight loss visit, previously seen weeks ago. She was started on this medication by Dr. Connell 2023 . Her starting weight was 242 lbs in February 2023. Per recommendation of  medication use, the medication can be lifelong to prevent weight regain.  She has maintained her weight loss on this medication .She denies any concerns and feels like medication is working well and she would like to continue on it.   She complains of increased discharge and would like to complete testing. Self swab infection.   OBJECTIVE:  BP 114/79   Pulse 75   Wt 171 lb 4.8 oz (77.7 kg)   LMP 10/14/2023   BMI 31.33 kg/m   Body mass index is 31.33 kg/m. Patient appears well. ASSESSMENT:  Obesity- responding well to weight loss plan PLAN:  To continue with current medications. Will follow up with swab results.    Cleave Hummer, CNM

## 2023-10-21 LAB — CERVICOVAGINAL ANCILLARY ONLY
Bacterial Vaginitis (gardnerella): POSITIVE — AB
Comment: NEGATIVE
Comment: NEGATIVE
Trichomonas: NEGATIVE

## 2023-10-22 ENCOUNTER — Encounter: Payer: Self-pay | Admitting: Certified Nurse Midwife

## 2023-10-22 ENCOUNTER — Other Ambulatory Visit: Payer: Self-pay | Admitting: Certified Nurse Midwife

## 2023-10-22 MED ORDER — METRONIDAZOLE 500 MG PO TABS: 500.0000 mg | ORAL_TABLET | Freq: Two times a day (BID) | ORAL | 0 refills | Status: AC

## 2023-11-18 ENCOUNTER — Telehealth: Payer: Self-pay

## 2023-11-18 NOTE — Telephone Encounter (Signed)
Prior authorization submitted for [facility]
